# Patient Record
Sex: Female | Born: 1945
Health system: Southern US, Community
[De-identification: ages and names within clinical notes are randomized; demographics above are authoritative.]

## PROBLEM LIST (undated history)

## (undated) DIAGNOSIS — K219 Gastro-esophageal reflux disease without esophagitis: Secondary | ICD-10-CM

## (undated) DIAGNOSIS — F329 Major depressive disorder, single episode, unspecified: Secondary | ICD-10-CM

## (undated) DIAGNOSIS — R0600 Dyspnea, unspecified: Secondary | ICD-10-CM

## (undated) DIAGNOSIS — M1990 Unspecified osteoarthritis, unspecified site: Secondary | ICD-10-CM

## (undated) DIAGNOSIS — I700 Atherosclerosis of aorta: Secondary | ICD-10-CM

## (undated) DIAGNOSIS — F32A Depression, unspecified: Secondary | ICD-10-CM

## (undated) DIAGNOSIS — R1310 Dysphagia, unspecified: Secondary | ICD-10-CM

## (undated) DIAGNOSIS — F419 Anxiety disorder, unspecified: Secondary | ICD-10-CM

## (undated) DIAGNOSIS — J45909 Unspecified asthma, uncomplicated: Secondary | ICD-10-CM

## (undated) DIAGNOSIS — C801 Malignant (primary) neoplasm, unspecified: Secondary | ICD-10-CM

## (undated) DIAGNOSIS — Z8489 Family history of other specified conditions: Secondary | ICD-10-CM

## (undated) HISTORY — PX: BREAST CYST ASPIRATION: SHX578

## (undated) HISTORY — PX: BREAST LUMPECTOMY: SHX2

## (undated) HISTORY — PX: NECK SURGERY: SHX720

## (undated) HISTORY — PX: BREAST EXCISIONAL BIOPSY: SUR124

## (undated) HISTORY — DX: Gastro-esophageal reflux disease without esophagitis: K21.9

## (undated) HISTORY — DX: Malignant (primary) neoplasm, unspecified: C80.1

## (undated) HISTORY — PX: FINGER SURGERY: SHX640

## (undated) HISTORY — PX: OTHER SURGICAL HISTORY: SHX169

## (undated) HISTORY — PX: CARPAL TUNNEL RELEASE: SHX101

## (undated) HISTORY — DX: Major depressive disorder, single episode, unspecified: F32.9

## (undated) HISTORY — PX: TONSILLECTOMY: SUR1361

## (undated) HISTORY — DX: Dysphagia, unspecified: R13.10

## (undated) HISTORY — DX: Depression, unspecified: F32.A

## (undated) HISTORY — PX: BREAST SURGERY: SHX581

---

## 1998-04-05 ENCOUNTER — Other Ambulatory Visit: Admission: RE | Admit: 1998-04-05 | Discharge: 1998-04-05 | Payer: Self-pay | Admitting: Obstetrics and Gynecology

## 1999-07-16 ENCOUNTER — Other Ambulatory Visit: Admission: RE | Admit: 1999-07-16 | Discharge: 1999-07-16 | Payer: Self-pay | Admitting: Obstetrics and Gynecology

## 2001-07-13 ENCOUNTER — Encounter: Payer: Self-pay | Admitting: Obstetrics and Gynecology

## 2001-07-13 ENCOUNTER — Ambulatory Visit (HOSPITAL_COMMUNITY): Admission: RE | Admit: 2001-07-13 | Discharge: 2001-07-13 | Payer: Self-pay | Admitting: Obstetrics and Gynecology

## 2001-10-18 ENCOUNTER — Encounter (HOSPITAL_COMMUNITY): Admission: RE | Admit: 2001-10-18 | Discharge: 2001-11-17 | Payer: Self-pay | Admitting: Orthopedic Surgery

## 2001-11-17 ENCOUNTER — Encounter (HOSPITAL_COMMUNITY): Admission: RE | Admit: 2001-11-17 | Discharge: 2001-12-17 | Payer: Self-pay | Admitting: Orthopedic Surgery

## 2002-11-23 ENCOUNTER — Ambulatory Visit (HOSPITAL_COMMUNITY): Admission: RE | Admit: 2002-11-23 | Discharge: 2002-11-23 | Payer: Self-pay | Admitting: Family Medicine

## 2002-11-23 ENCOUNTER — Encounter: Payer: Self-pay | Admitting: Family Medicine

## 2003-10-11 ENCOUNTER — Ambulatory Visit (HOSPITAL_COMMUNITY): Admission: RE | Admit: 2003-10-11 | Discharge: 2003-10-11 | Payer: Self-pay | Admitting: Family Medicine

## 2003-10-13 ENCOUNTER — Ambulatory Visit (HOSPITAL_COMMUNITY): Admission: RE | Admit: 2003-10-13 | Discharge: 2003-10-13 | Payer: Self-pay | Admitting: Family Medicine

## 2003-10-17 ENCOUNTER — Ambulatory Visit (HOSPITAL_COMMUNITY): Admission: RE | Admit: 2003-10-17 | Discharge: 2003-10-17 | Payer: Self-pay | Admitting: Family Medicine

## 2003-11-27 ENCOUNTER — Ambulatory Visit (HOSPITAL_COMMUNITY): Admission: RE | Admit: 2003-11-27 | Discharge: 2003-11-27 | Payer: Self-pay | Admitting: Family Medicine

## 2003-12-12 ENCOUNTER — Ambulatory Visit (HOSPITAL_COMMUNITY): Admission: RE | Admit: 2003-12-12 | Discharge: 2003-12-12 | Payer: Self-pay | Admitting: Internal Medicine

## 2005-01-08 ENCOUNTER — Ambulatory Visit (HOSPITAL_COMMUNITY): Admission: RE | Admit: 2005-01-08 | Discharge: 2005-01-08 | Payer: Self-pay | Admitting: Family Medicine

## 2005-04-29 ENCOUNTER — Ambulatory Visit (HOSPITAL_COMMUNITY): Admission: RE | Admit: 2005-04-29 | Discharge: 2005-04-29 | Payer: Self-pay | Admitting: Family Medicine

## 2005-08-25 ENCOUNTER — Inpatient Hospital Stay (HOSPITAL_COMMUNITY): Admission: RE | Admit: 2005-08-25 | Discharge: 2005-08-26 | Payer: Self-pay | Admitting: Neurosurgery

## 2006-01-12 ENCOUNTER — Ambulatory Visit (HOSPITAL_COMMUNITY): Admission: RE | Admit: 2006-01-12 | Discharge: 2006-01-12 | Payer: Self-pay | Admitting: Internal Medicine

## 2006-07-29 ENCOUNTER — Ambulatory Visit: Payer: Self-pay | Admitting: Internal Medicine

## 2006-08-31 ENCOUNTER — Ambulatory Visit (HOSPITAL_COMMUNITY): Admission: RE | Admit: 2006-08-31 | Discharge: 2006-08-31 | Payer: Self-pay | Admitting: Internal Medicine

## 2006-08-31 ENCOUNTER — Encounter (INDEPENDENT_AMBULATORY_CARE_PROVIDER_SITE_OTHER): Payer: Self-pay | Admitting: *Deleted

## 2006-08-31 ENCOUNTER — Ambulatory Visit: Payer: Self-pay | Admitting: Internal Medicine

## 2007-01-14 ENCOUNTER — Ambulatory Visit (HOSPITAL_COMMUNITY): Admission: RE | Admit: 2007-01-14 | Discharge: 2007-01-14 | Payer: Self-pay | Admitting: Family Medicine

## 2007-11-17 ENCOUNTER — Ambulatory Visit (HOSPITAL_COMMUNITY): Admission: RE | Admit: 2007-11-17 | Discharge: 2007-11-17 | Payer: Self-pay | Admitting: Family Medicine

## 2008-02-23 ENCOUNTER — Ambulatory Visit (HOSPITAL_COMMUNITY): Admission: RE | Admit: 2008-02-23 | Discharge: 2008-02-23 | Payer: Self-pay | Admitting: Family Medicine

## 2008-11-01 ENCOUNTER — Ambulatory Visit (HOSPITAL_COMMUNITY): Admission: RE | Admit: 2008-11-01 | Discharge: 2008-11-01 | Payer: Self-pay | Admitting: Family Medicine

## 2009-05-09 ENCOUNTER — Ambulatory Visit (HOSPITAL_COMMUNITY): Admission: RE | Admit: 2009-05-09 | Discharge: 2009-05-09 | Payer: Self-pay | Admitting: Family Medicine

## 2009-07-10 ENCOUNTER — Ambulatory Visit (HOSPITAL_COMMUNITY): Admission: RE | Admit: 2009-07-10 | Discharge: 2009-07-10 | Payer: Self-pay | Admitting: Family Medicine

## 2010-02-23 ENCOUNTER — Ambulatory Visit (HOSPITAL_COMMUNITY): Admission: RE | Admit: 2010-02-23 | Discharge: 2010-02-23 | Payer: Self-pay | Admitting: Family Medicine

## 2010-06-23 ENCOUNTER — Encounter: Payer: Self-pay | Admitting: Family Medicine

## 2010-07-12 ENCOUNTER — Other Ambulatory Visit (HOSPITAL_COMMUNITY): Payer: Self-pay | Admitting: Family Medicine

## 2010-07-12 DIAGNOSIS — R51 Headache: Secondary | ICD-10-CM

## 2010-07-15 ENCOUNTER — Ambulatory Visit (HOSPITAL_COMMUNITY)
Admission: RE | Admit: 2010-07-15 | Discharge: 2010-07-15 | Disposition: A | Discharge status: Home / Self Care | Payer: Medicare Other | Source: Ambulatory Visit | Attending: Family Medicine | Admitting: Family Medicine

## 2010-07-15 DIAGNOSIS — R51 Headache: Secondary | ICD-10-CM | POA: Insufficient documentation

## 2010-10-09 ENCOUNTER — Other Ambulatory Visit (HOSPITAL_COMMUNITY): Payer: Self-pay | Admitting: Family Medicine

## 2010-10-09 DIAGNOSIS — Z139 Encounter for screening, unspecified: Secondary | ICD-10-CM

## 2010-10-15 ENCOUNTER — Ambulatory Visit (HOSPITAL_COMMUNITY)
Admission: RE | Admit: 2010-10-15 | Discharge: 2010-10-15 | Disposition: A | Discharge status: Home / Self Care | Payer: Medicare Other | Source: Ambulatory Visit | Attending: Family Medicine | Admitting: Family Medicine

## 2010-10-15 DIAGNOSIS — Z139 Encounter for screening, unspecified: Secondary | ICD-10-CM

## 2010-10-15 DIAGNOSIS — Z1231 Encounter for screening mammogram for malignant neoplasm of breast: Secondary | ICD-10-CM | POA: Insufficient documentation

## 2010-10-18 NOTE — Op Note (Signed)
NAME:  Dominique Cobb, Dominique Cobb NO.:  [id number]   MEDICAL RECORD NO.:  [id number]                  PATIENT TYPE:   LOCATION:                                       FACILITY:   PHYSICIAN:  Lionel December, M.D.                 DATE OF BIRTH:   DATE OF PROCEDURE:  12/12/2003  DATE OF DISCHARGE:                                 OPERATIVE REPORT   (CORRECTED COPY   PROCEDURE:  Total colonoscopy.   ENDOSCOPIST:  Lionel December, M.D.   INDICATIONS:  This patient is 65 year old Caucasian female who is undergoing  screening colonoscopy.  Family history is negative for CRC.   The procedure and risks were reviewed with the patient and informed consent  was obtained.   PREOPERATIVE MEDICATIONS:  Demerol 35 mg IV and Versed 7 mg IV.   FINDINGS:  Procedure performed in endoscopy suite.  The patient's vital  signs and O2 saturation were monitored during the procedure and remained  stable.  The patient was placed in the left lateral recumbent position and  rectal examination was performed.  No abnormality noted on external or  digital exam.   Olympus videoscope was placed in the rectum and advanced under vision into  the sigmoid colon and beyond.  The scope was passed into the cecum which was  identified by ileocecal valve and appendiceal orifice.  Pictures taken for  the record.  As the scope was withdrawn, the colonic mucosa was, once again,  carefully examined.  There was a tiny polyp at descending colon which was  ablated by a cold biopsy.  A few tiny diverticula were noted at the sigmoid  colon on the way in. There were no other abnormalities at the sigmoid or the  rectum.   The scope was retroflexed to examine the anorectal junction which was  unremarkable.  The endoscope was straightened and withdrawn.  The patient  tolerated the procedure well.   FINAL DIAGNOSES:  1. A few small diverticula at sigmoid colon.  2. A tiny polyp ablated via cold biopsy from  descending colon.   RECOMMENDATIONS:  1. High fiber diet.  2. I will be contacting the patient with biopsy results.  If this is an     adenoma, she will return for repeat exam in 5 years; otherwise could wait     10 years before her next screening study.      ___________________________________________                                            Lionel December, M.D.   NR/MEDQ  D:  12/12/2003  T:  12/12/2003  Job:  [id number]   cc:   Corrie Mckusick, M.D.  [address] [place]. [place]  [place]  [place] [address]  Fax: [phone]

## 2010-10-18 NOTE — Op Note (Signed)
NAMEBRETTANY, Dominique Cobb               ACCOUNT NO.:  [id number]   MEDICAL RECORD NO.:  [id number]          PATIENT TYPE:  INP   LOCATION:  3023                         FACILITY:  [facility]   PHYSICIAN:  Hewitt Shorts, M.D.DATE OF BIRTH:  Oct 24, 1945   DATE OF PROCEDURE:  08/25/2005  DATE OF DISCHARGE:  08/26/2005                                 OPERATIVE REPORT   PREOPERATIVE DIAGNOSIS:  Cervical spondylosis, cervical degenerative disk  disease, and cervical stenosis.   POSTOPERATIVE DIAGNOSIS:  Cervical spondylosis, cervical degenerative disk  disease, and cervical stenosis.   OPERATION PERFORMED:  C5-6 and C6-7 anterior cervical diskectomy and  arthrodesis with PEEK interbody implants and DBX and tether cervical  plating.   SURGEON:  Hewitt Shorts, M.D.   ASSISTANT:  Danae Orleans. Venetia Maxon, M.D.   ANESTHESIA:  General endotracheal.   INDICATIONS FOR PROCEDURE:  The patient is a 65 year old woman who presented  with neck pain, pain extending down toward the right shoulder.  She was  found to have advanced spondylosis and degenerative disk disease at the C5-6  and C6-7 levels with resulting canal stenosis and decision was made to  proceed with decompression and arthrodesis.   DESCRIPTION OF PROCEDURE:  The patient was brought to the operating room and  placed under general endotracheal anesthesia.  The patient was placed in 10  pounds of halter traction.  The neck was prepped with Betadine soap and  solution and draped in sterile fashion.  A horizontal incision was made on  the left side of the neck.  The line of the incision was infiltrated with  local anesthetic and epinephrine.  Dissection was carried down through the  subcutaneous tissue and platysma and then dissection was carried out to an  avascular plane, leaving the sternocleidomastoid, carotid artery and jugular  vein laterally and trachea and esophagus medially.  The ventral aspect of  the vertebral column identified  and a localizing x-ray was taken.  The C5-6  and C6-7 intervertebral disk spaces were identified.  Diskectomy was begun  at each level with incision of the annulus continued with micro curettes and  pituitary rongeurs. Anterior osteophytic overgrowth was removed using a  double action rongeur at each level and then the cartilaginous end plates of  the corresponding vertebrae were removed using micro curettes and the X-Max  drill.  The operating microscope was draped and brought into the field to  provide additional magnification, illumination and visualization and the  remainder of the decompression was performed using microdissection and  microsurgical technique. Diskectomy was performed with micro curettes and  pituitary rongeurs.  Posterior osteophytic overgrowth was significant at  each level and at each level we thinned the osteophytic overgrowth with the  X-Max drill along and then removed the last bit with 2 mm Kerrison punch  with a thin foot plate.  The posterior longitudinal ligament was thickened  at each level and partially calcified and this was likewise carefully  removed and we were able to decompress the spinal canal and thecal sac and  subsequently the neural foramina and nerve roots.  Once the  decompression  was completed, hemostasis was established with the use of Gelfoam soaked in  Thrombin.  We measured the intervertebral disk spaces and selected 5 mm  implants.  We used AVSAS PEEK interbody implants, small size at the C5-6 and  a large size at the C6-7.  Each was packed with DBX putty and carefully  positioned.  We then selected a 32 mm tether cervical plate.  The traction  was discontinued.  The plate was positioned over the fusion construct and  secured to the vertebrae with 4.0 x 14 mm screws, each was variable angle.  We placed two screws at C5, two screws at C7 and a single screw at C6.  Each  screw hole was drilled and tapped and the screw placed in alternating   fashion.  Final tightening was done of all five screws and then the wound  was irrigated with bacitracin solution, checked for hemostasis which was  established and confirmed and then closure was performed with the platysma  being approximated with interrupted inverted 2-0 undyed Vicryl sutures and  the subcutaneous and subcuticular layer were closed with interrupted  inverted 3-0 undyed Vicryl sutures and the skin edges were approximated with  DermaBond.  The procedure was tolerated well.  The estimated blood loss was  50 mL.  Sponge, needle and instrument counts were correct.  Following  surgery, the patient was to be placed in a soft cervical collar, reversed  from anesthetic, extubated and transferred to recovery room for further  care.      Hewitt Shorts, M.D.  Electronically Signed     RWN/MEDQ  D:  08/25/2005  T:  08/26/2005  Job:  [id number]

## 2010-10-18 NOTE — Op Note (Signed)
[name], [name]               ACCOUNT NO.:  [id number]   MEDICAL RECORD NO.:  [id number]          PATIENT TYPE:  AMB   LOCATION:  DAY                           FACILITY:  [facility]   PHYSICIAN:  Lionel December, M.D.    DATE OF BIRTH:  [birth year]   DATE OF PROCEDURE:  08/31/2006  DATE OF DISCHARGE:                               OPERATIVE REPORT   PROCEDURE:  Esophagogastroduodenoscopy with esophageal dilation.   INDICATION:  [name] is 65 year old Caucasian female with several-month  history of dysphagia both solids as well as liquids.  She does not even  know what heartburn is.  But no history of heartburn.  She is undergoing  diagnostic/therapeutic procedure.  Procedure risks were reviewed with  the patient and informed consent was obtained.   MEDS FOR CONSCIOUS SEDATION:  Benzocaine spray for pharyngeal topical  anesthesia, Demerol 50 mg IV, Versed 5 mg IV.   FINDINGS:  Procedure performed in endoscopy suite.  The patient's vital  signs and O2 sat were monitored during procedure and remained stable.  The patient was placed left lateral recumbent position and Pentax  videoscope was passed via oropharynx without any difficulty into  esophagus.   Esophagus.  Mucosa of the esophagus was normal except very faint mucosal  ridging at body.  GE junction was at 40 cm from the incisors and was  unremarkable without ring or stricture formation.  The proximal segment  was carefully examined on the way out was normal.   Stomach.  It was empty and distended very well insufflation.  Folds of  proximal stomach were normal.  Examination mucosa revealed 4 to 5 mm  polyp at gastric body which was ablated via cold biopsy.  There was  prepyloric/antral scar with central erosion indicative of a healed  ulcer.  Pictures taken for the record.  Pyloric channel was patent.  Angularis, fundus and cardia examined by retroflexing scope and were  normal.   Duodenum.  Bulbar mucosa was normal.  Scope was  passed into second part  of duodenum where mucosa and folds were normal.   Endoscope was withdrawn.   Esophagus was dilated by passing 54-French Maloney dilator to full  insertion.  As the dilator was withdrawn, endoscope was passed again,  there were few tiny linear areas with mucosal disruption at cervical  esophagus indicative of either an unrecognized web or narrowing in the  segments.  Pictures taken for the record.  A biopsy was also obtained  from mucosa at esophageal body, looking for eosinophilic esophagitis.  Endoscope was withdrawn.  The patient tolerated the procedure well.   FINAL DIAGNOSIS:  No obvious esophageal stricture or ring noted but  esophageal dilation with 54-French Maloney dilator resulting in a few  linear tears of cervical esophagus either indicative of either of web or  localize esophageal narrowing.   Esophageal biopsy taken from the body, looking for eosinophilic  esophagitis.   The small gastric polyp at body which was ablated via cold biopsy.   Healed antral ulcer.   RECOMMENDATIONS:  She will resume her usual diet.  Check her H pylori  serology.  I will be contacting the patient with results of biopsy and  blood test.  If she remains with dysphagia, will proceed with barium  pill study prior to considering esophageal manometry.      Lionel December, M.D.  Electronically Signed     NR/MEDQ  D:  08/31/2006  T:  08/31/2006  Job:  [id number]   cc:   Dominique Cobb, M.D.  Fax: [phone]

## 2010-10-18 NOTE — Consult Note (Signed)
NAMERISHA, BARRETTA               ACCOUNT NO.:  [id number]   MEDICAL RECORD NO.:  [id number]          PATIENT TYPE:  AMB   LOCATION:  DAY                           FACILITY:  [facility]   PHYSICIAN:  Lionel December, M.D.    DATE OF BIRTH:  November 12, 1945   DATE OF CONSULTATION:  07/29/2006  DATE OF DISCHARGE:                                 CONSULTATION   PRESENTING COMPLAINT:  Dysphagia to liquids.   HISTORY OF PRESENT ILLNESS:  Dominique Cobb is a 65 year old Caucasian female who  is referred through courtesy of Dr. Phillips Odor for evaluation of dysphagia.  This symptom began about a year ago.  It has been sporadic but  experienced more and more frequent lately.  Now she may have it a couple  of times a week.  She reports difficulty with liquids, primarily with  soft drinks, coffee and wine which she may drink once or twice a week.  She describes her symptom as little bump due to flow of liquid.  She  points to her mid to upper sternal area as the site of bolus  obstruction.  Liquid always goes down.  She has not had any episode of  regurgitation or coughing spells.  She does not have any pain associated  with it.  She has noticed some difficulty with large pills which she is  having to break but reports no difficulty with solids.  She has noted  intermittent hoarseness and need to clear her throat.  She remains with  very good appetite.  She denies melena, rectal bleeding or abdominal  pain.  She also does not give history of heartburn.  She is very  concerned about this symptom because of a several year history of  smoking, which she quit 3 years ago.   She is on Wellbutrin 200 mg b.i.d., MVI daily, calcium with vitamin D  daily, OTC ibuprofen p.r.n. no more than a couple of doses a month and  Sudafed daily p.r.n.   PAST MEDICAL HISTORY:  1. Chronic depression well-controlled with therapy.  2. She had normal bone density study 2 years ago.  3. She had tonsillectomy at age 60.  4. She has had 2  benign cysts removed from her left breast one 30      years ago, another one 15 years ago.  5. She has had bilateral decompression for carpal tunnel syndrome.  6. She had neck surgery with fusion at C5-C6 and C6-C7 in March 2007.  7. She had surgery on her right shoulder for biceps injury as well as      rotator cuff.  8. She had release of 2 trigger fingers in her right hand and one      finger in the left hand.  9. She had screening colonoscopy by me in July 2005 revealing few      diverticula at sigmoid colon and she had one hyperplastic polyp      removed.   ALLERGIES:  To CODEINE which causes somnolence and irritability.   FAMILY HISTORY:  Mother died of auto accident at age 72.  Father lived  to be 92 and she does not have any siblings.   SOCIAL HISTORY:  She is single.  She does not have any children.  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  She smokes a pack of cigarettes a day  for more than 30 years but quit 3 years ago.  She drinks wine maybe once  or twice a week.   PHYSICAL EXAM:  Pleasant well-developed, well-nourished Caucasian female  who is in no acute distress.  She weighs 131 pounds.  She is 5 feet 4  inches tall.  Pulse 60 per minute, blood pressure 112/80, temperature is  97.6.  Conjunctivae is pink.  Sclerae is nonicteric.  Oropharyngeal  mucosa is normal.  Dentition is satisfactory condition.  No neck masses  or thyromegaly noted.  Cardiac exam with regular rhythm.  Normal S1-S2.  No murmur or gallop noted.  Lungs are clear to auscultation.  Abdomen is  symmetrical, soft and nontender without organomegaly or masses.  No  peripheral edema or clubbing noted.   ASSESSMENT:  Dominique Cobb is a 65 year old Caucasian female who presents with  intermittent dysphagia which is experienced with liquids and at times  and pills but not solids.  She does not have any throat symptoms other  than that but not  with solids.  While she does give history of  intermittent hoarseness and need to clear her throat, there is nothing  to suggest aspiration.  She is pointing to her midsternum as the site of  a little bump.  Her dysphagia, little bump or transient obstruction  to bolus of liquid presentation is rather curious.  I wonder if she has  a motility disorder or a Zenker's diverticulum.  However, structural  abnormality also needs to be ruled out before less common diagnoses is  considered.   RECOMMENDATIONS:  Diagnostic esophagogastroduodenoscopy to be performed  at [facility] with esophageal dilation if indicated.   I have reviewed the procedure and risks with the patient; she is  agreeable.   We would like to thank Dr. Phillips Odor for the opportunity to participate in  the care of this nice lady. signs not she p.m.      Lionel December, M.D.  Electronically Signed     NR/MEDQ  D:  07/29/2006  T:  07/30/2006  Job:  [id number]   cc:   Corrie Mckusick, M.D.  Fax: [phone]

## 2011-02-21 ENCOUNTER — Encounter (INDEPENDENT_AMBULATORY_CARE_PROVIDER_SITE_OTHER): Payer: Self-pay | Admitting: *Deleted

## 2011-03-06 ENCOUNTER — Ambulatory Visit (INDEPENDENT_AMBULATORY_CARE_PROVIDER_SITE_OTHER): Payer: Medicare Other | Admitting: Internal Medicine

## 2011-03-20 ENCOUNTER — Ambulatory Visit (INDEPENDENT_AMBULATORY_CARE_PROVIDER_SITE_OTHER): Payer: Medicare Other | Admitting: Internal Medicine

## 2011-03-20 ENCOUNTER — Other Ambulatory Visit (INDEPENDENT_AMBULATORY_CARE_PROVIDER_SITE_OTHER): Payer: Self-pay | Admitting: *Deleted

## 2011-03-20 ENCOUNTER — Encounter (INDEPENDENT_AMBULATORY_CARE_PROVIDER_SITE_OTHER): Payer: Self-pay | Admitting: *Deleted

## 2011-03-20 ENCOUNTER — Encounter (INDEPENDENT_AMBULATORY_CARE_PROVIDER_SITE_OTHER): Payer: Self-pay | Admitting: Internal Medicine

## 2011-03-20 DIAGNOSIS — F329 Major depressive disorder, single episode, unspecified: Secondary | ICD-10-CM

## 2011-03-20 DIAGNOSIS — R1310 Dysphagia, unspecified: Secondary | ICD-10-CM | POA: Insufficient documentation

## 2011-03-20 DIAGNOSIS — F32A Depression, unspecified: Secondary | ICD-10-CM | POA: Insufficient documentation

## 2011-03-20 DIAGNOSIS — K219 Gastro-esophageal reflux disease without esophagitis: Secondary | ICD-10-CM | POA: Insufficient documentation

## 2011-03-20 NOTE — Patient Instructions (Signed)
Dexilant samples given to patient. Take Dexilant 30 minutes before breakfast.  Chew foods well.

## 2011-03-20 NOTE — Progress Notes (Signed)
Subjective:     Patient ID: Dominique Cobb, female   DOB: February 09, 1946, 65 y.o.   MRN: [id number]  HPI Dominique Cobb is a 66 yr old female referred to our office for GERD.   She says when she drinks water it feels like a marble going down.  The Prilosec has helped but she is not symptom free.   It hurts to swallow liquids.  Some foods are slow to go down.  After her neck surgery she had troubles swallowing pills.  She does not drink soft drinks because they burn going down.  Hoarseness and she clears her throat.  She has had symptoms for 3-4 months.  She has had an EGD/ED years ago for dysphagia.  Review of Systems see hpi Current Outpatient Prescriptions  Medication Sig Dispense Refill  . buPROPion (WELLBUTRIN) 100 MG tablet Take 300 mg by mouth daily.        Marland Kitchen ibuprofen (ADVIL,MOTRIN) 200 MG tablet Take 200 mg by mouth every 6 (six) hours as needed.        . Misc Natural Products (TART CHERRY ADVANCED PO) Take by mouth 3 (three) times daily.        Marland Kitchen omeprazole (PRILOSEC) 20 MG capsule Take 20 mg by mouth 2 (two) times daily before a meal.        . pseudoephedrine (SUDAFED) 30 MG tablet Take 30 mg by mouth every 4 (four) hours as needed.         No current outpatient prescriptions on file prior to visit.    Past Medical History  Diagnosis Date  . Depression    History   Social History Narrative  . No narrative on file   Family Status  Relation Status Death Age  . Mother Deceased     fire accident  . Father Deceased     natural causes   History   Social History Narrative  . No narrative on file   History   Social History  . Marital Status: Divorced    Spouse Name: N/A    Number of Children: N/A  . Years of Education: N/A   Occupational History  . Not on file.   Social History Main Topics  . Smoking status: Never Smoker   . Smokeless tobacco: Not on file  . Alcohol Use: No  . Drug Use: No  . Sexually Active: Not on file   Other Topics Concern  . Not on file    Social History Narrative  . No narrative on file  .No Known Allergies    Objective:   Physical Exam Filed Vitals:   03/20/11 1118  BP: 110/70  Pulse: 62  Temp: 98.2 F (36.8 C)  Height: 5\' 4"  (1.626 m)  Weight: 132 lb 8 oz (60.102 kg)    Alert and oriented. Skin warm and dry. Oral mucosa is moist. Natural teeth in good condition. Sclera anicteric, conjunctivae is pink. Thyroid not enlarged. No cervical lymphadenopathy. Lungs clear. Heart regular rate and rhythm.  Abdomen is soft. Bowel sounds are positive. No hepatomegaly. No abdominal masses felt. No tenderness.  No edema to lower extremities. Patient is alert and oriented.      Assessment:    Solid food dysphagia. Esophageal burning.  Esophagitis needs to be ruled out.     Plan:     EGD/ED  Samples of Dexilant (6 boxes given to patient). Stop Prilosec. If you have breakthru acid reflux at night, may take a Prilosec

## 2011-04-07 ENCOUNTER — Encounter (HOSPITAL_COMMUNITY): Payer: Self-pay | Admitting: Pharmacy Technician

## 2011-04-09 MED ORDER — SODIUM CHLORIDE 0.45 % IV SOLN
Freq: Once | INTRAVENOUS | Status: AC
  Administered 2011-04-10: 1000 mL via INTRAVENOUS

## 2011-04-10 ENCOUNTER — Other Ambulatory Visit (INDEPENDENT_AMBULATORY_CARE_PROVIDER_SITE_OTHER): Payer: Self-pay | Admitting: Internal Medicine

## 2011-04-10 ENCOUNTER — Ambulatory Visit (HOSPITAL_COMMUNITY)
Admission: RE | Admit: 2011-04-10 | Discharge: 2011-04-10 | Disposition: A | Discharge status: Home / Self Care | Payer: Medicare Other | Source: Ambulatory Visit | Attending: Internal Medicine | Admitting: Internal Medicine

## 2011-04-10 ENCOUNTER — Encounter (HOSPITAL_COMMUNITY)
Admission: RE | Disposition: A | Discharge status: Home / Self Care | Payer: Self-pay | Source: Ambulatory Visit | Attending: Internal Medicine

## 2011-04-10 ENCOUNTER — Encounter (HOSPITAL_COMMUNITY): Payer: Self-pay | Admitting: *Deleted

## 2011-04-10 DIAGNOSIS — Q398 Other congenital malformations of esophagus: Secondary | ICD-10-CM | POA: Insufficient documentation

## 2011-04-10 DIAGNOSIS — R1310 Dysphagia, unspecified: Secondary | ICD-10-CM | POA: Insufficient documentation

## 2011-04-10 DIAGNOSIS — K208 Other esophagitis: Secondary | ICD-10-CM

## 2011-04-10 DIAGNOSIS — K222 Esophageal obstruction: Secondary | ICD-10-CM

## 2011-04-10 HISTORY — DX: Unspecified osteoarthritis, unspecified site: M19.90

## 2011-04-10 SURGERY — ESOPHAGOGASTRODUODENOSCOPY (EGD) WITH ESOPHAGEAL DILATION
Anesthesia: Moderate Sedation

## 2011-04-10 MED ORDER — MEPERIDINE HCL 25 MG/ML IJ SOLN
INTRAMUSCULAR | Status: DC | PRN
  Administered 2011-04-10 (×2): 25 mg via INTRAVENOUS

## 2011-04-10 MED ORDER — BUTAMBEN-TETRACAINE-BENZOCAINE 2-2-14 % EX AERO
INHALATION_SPRAY | CUTANEOUS | Status: DC | PRN
  Administered 2011-04-10: 2 via TOPICAL

## 2011-04-10 MED ORDER — MIDAZOLAM HCL 5 MG/5ML IJ SOLN
INTRAMUSCULAR | Status: DC | PRN
  Administered 2011-04-10: 1 mg via INTRAVENOUS
  Administered 2011-04-10 (×2): 2 mg via INTRAVENOUS
  Administered 2011-04-10: 1 mg via INTRAVENOUS

## 2011-04-10 MED ORDER — MIDAZOLAM HCL 5 MG/5ML IJ SOLN
INTRAMUSCULAR | Status: AC
  Filled 2011-04-10: qty 10

## 2011-04-10 MED ORDER — MEPERIDINE HCL 50 MG/ML IJ SOLN
INTRAMUSCULAR | Status: AC
  Filled 2011-04-10: qty 1

## 2011-04-10 MED ORDER — STERILE WATER FOR IRRIGATION IR SOLN
Status: DC | PRN
  Administered 2011-04-10: 09:00:00

## 2011-04-10 MED ORDER — DEXLANSOPRAZOLE 60 MG PO CPDR: 60.0000 mg | DELAYED_RELEASE_CAPSULE | Freq: Every day | ORAL | Status: DC

## 2011-04-10 NOTE — Op Note (Signed)
EGD PROCEDURE REPORT  PATIENT:  Dominique Cobb  MR#:  [id number] Birthdate:  1945/12/31, 66 y.o., female Endoscopist:  Dr. Malissa Hippo, MD Referred By:  Dr. Colette Ribas, M.D. Procedure Date: 04/10/2011  Procedure:   EGD with ED.  Indications:  Patient is 65 year old Caucasian female with esophageal to solids as well as pills. Her last EGD was in March 2008 but no structural abnormality to her esophagus and she responded to [facility] dilation. At that time she was felt to have oximeter esophageal web or stricture. Her GERD symptoms are well controlled with therapy. He believes she is getting better response with dexilant rather than omeprazole.            Informed Consent:  Procedure and risks were reviewed with the patient and informed consent was obtained Medications:  Demerol 50 mg IV Versed 6 mg IV Cetacaine spray topically for oropharyngeal anesthesia  Description of procedure:  The endoscope was introduced through the mouth and advanced to the second portion of the duodenum without difficulty or limitations. The mucosal surfaces were surveyed very carefully during advancement of the scope and upon withdrawal.  Findings:  Esophagus:  There were conspicuous mucosal ridges or incomplete ring at esophageal body; none was felt to be critical or high-grade. No erosions or ulcers were identified. GEJ:  40 cm Stomach:  Stomach was empty and distended very well with insufflation. Folds in the proximal stomach were normal. Duodenum:  Normal bulbar and post bulbar mucosa.  Therapeutic/Diagnostic Maneuvers Performed:  Esophageal dilation tented with 54 French Maloney dilator could not be passed beyond 25 cm. Soft and this was then dilated by passing 48 [place] Maloney dilator to full insertion. The scope was passed again and there was mucosal disruption from 22-25 cm from the incisors. No mucosal disruption noted to distal esophageal mucosa. Esophageal biopsy was taken from mucosa at body  looking for eosinophilic esophagitis.  Complications:  None  Impression: No evidence of erosive or ulcerative esophagitis. Esophageal narrowing voluming proximal segment undated with 48 French Maloney dilator.  Esophageal biopsy taken to rule out eosinophilic esophagitis.   Recommendations:  Soft diet for 24-hours. Continue anti-reflux measures and Dexilant as before. I will be contacting patient with results of biopsy.  Ranald Alessio U  04/10/2011  9:51 AM  CC: Dr. Colette Ribas, MD & Dr. Bonnetta Barry ref. provider found

## 2011-04-10 NOTE — H&P (Signed)
This is an update to history and physical from 03/20/2011. Patient's history or medications have not changed. She has dysphagia primarily to solids and points to the midsternal area site of bolus obstruction. He also is having difficulty swallowing her pills. Patient,s  last EGD ED was in March 2008. No structural abnormality was identified. She did respond to [facility] dilation. Patient is up-to-date on screening for CRC. Her last exam was in July 2007. Patient is agreeable to proceed with EGD and ED.

## 2011-04-14 ENCOUNTER — Telehealth (INDEPENDENT_AMBULATORY_CARE_PROVIDER_SITE_OTHER): Payer: Self-pay | Admitting: Internal Medicine

## 2011-04-14 NOTE — Telephone Encounter (Signed)
Will call an Rx in For Dexilant.

## 2011-04-14 NOTE — Telephone Encounter (Signed)
Rx called in to [facility]

## 2011-04-14 NOTE — Telephone Encounter (Signed)
Needs a refill on Dexilant 90 day supply. It will require an auth and the phone number is [phone]. Dominique Cobb would also like to get her biopsy results. The return phone number is [phone].

## 2011-04-16 ENCOUNTER — Telehealth (INDEPENDENT_AMBULATORY_CARE_PROVIDER_SITE_OTHER): Payer: Self-pay | Admitting: Internal Medicine

## 2011-04-16 NOTE — Telephone Encounter (Signed)
I printed the results from the BX and will give it ti Terri Setzer,NP to address with patient .

## 2011-04-16 NOTE — Telephone Encounter (Signed)
Would like to get the biopsy results. Please return the call to [phone].

## 2011-04-16 NOTE — Telephone Encounter (Signed)
I advised her that Dr. Karilyn Cota would call her with the biopsy results.

## 2011-04-16 NOTE — Telephone Encounter (Signed)
Dr. Karilyn Cota will call her with  results

## 2011-04-17 NOTE — Telephone Encounter (Signed)
I went over the results of biopsy with the patient last evening. She experienced some discomfort on swallowing but this has completely resolved. She is able to swallow much better. Biopsy negative for eosinophilic esophagitis. Patient will continue Dexilant and return for office visit in 6 months.  Please send a copy of report to PCP.

## 2011-04-17 NOTE — Telephone Encounter (Signed)
6 mth f/u has been added to the patient's recall list for apt

## 2011-10-07 ENCOUNTER — Encounter (INDEPENDENT_AMBULATORY_CARE_PROVIDER_SITE_OTHER): Payer: Self-pay | Admitting: *Deleted

## 2011-10-23 ENCOUNTER — Ambulatory Visit (INDEPENDENT_AMBULATORY_CARE_PROVIDER_SITE_OTHER): Payer: Medicare Other | Admitting: Internal Medicine

## 2011-11-05 ENCOUNTER — Encounter (INDEPENDENT_AMBULATORY_CARE_PROVIDER_SITE_OTHER): Payer: Self-pay | Admitting: Internal Medicine

## 2011-11-05 ENCOUNTER — Ambulatory Visit (INDEPENDENT_AMBULATORY_CARE_PROVIDER_SITE_OTHER): Payer: Medicare Other | Admitting: Internal Medicine

## 2011-11-05 VITALS — BP 110/64 | HR 72 | Temp 98.4°F | Ht 64.0 in | Wt 131.7 lb

## 2011-11-05 DIAGNOSIS — R1310 Dysphagia, unspecified: Secondary | ICD-10-CM

## 2011-11-05 MED ORDER — OMEPRAZOLE 20 MG PO CPDR: 20.0000 mg | DELAYED_RELEASE_CAPSULE | Freq: Two times a day (BID) | ORAL | Status: DC

## 2011-11-05 NOTE — Patient Instructions (Signed)
Chew foods well. Cut meats up well. Barium Swallow

## 2011-11-05 NOTE — Progress Notes (Signed)
Subjective:     Patient ID: Dominique Cobb, female   DOB: 11-Sep-1945, 66 y.o.   MRN: [id number]  HPI Presents today for f/u after undergoing an EGD/ED in November. See below.  She continues to have difficulty swallowing. It is better.  She still has to be careful if she drinks fluids. She is eating smaller bites. She thinks some of this is related to her neck. Appetite is good. No weight loss.   She says for right now she does not want EGD/ED repeated.  04/10/2011 EGD/ED:Impression:  No evidence of erosive or ulcerative esophagitis.  Esophageal narrowing voluming proximal segment undated with 48 French Maloney dilator.  Esophageal biopsy taken to rule out eosinophilic esophagitis.       Review of Systems see hpi Current Outpatient Prescriptions  Medication Sig Dispense Refill  . buPROPion (WELLBUTRIN XL) 300 MG 24 hr tablet Take 300 mg by mouth daily.        . cycloSPORINE (RESTASIS) 0.05 % ophthalmic emulsion Place 1 drop into both eyes 2 (two) times daily.        Marland Kitchen dexlansoprazole (DEXILANT) 60 MG capsule Take 60 mg by mouth daily.        Marland Kitchen dexlansoprazole (DEXILANT) 60 MG capsule Take 1 capsule (60 mg total) by mouth daily.  30 capsule  5  . omeprazole (PRILOSEC) 20 MG capsule Take 1 capsule (20 mg total) by mouth 2 (two) times daily before a meal.  60 capsule  3   Past Medical History  Diagnosis Date  . Depression   . Arthritis   . GERD (gastroesophageal reflux disease)   . Dysphagia    Past Surgical History  Procedure Date  . Neck surgery     2007  . Breast surgery     benign.  25 yrs ago  . Laproscopy     over 20 yrs ago  . Dental implants   . Carpal tunnel release 1980's    bilateral   . Finger surgery     trigger finger 3rd  bilaterally   Family Status  Relation Status Death Age  . Mother Deceased     fire accident  . Father Deceased     natural causes   History   Social History  . Marital Status: Divorced    Spouse Name: N/A    Number of Children:  N/A  . Years of Education: N/A   Occupational History  . Not on file.   Social History Main Topics  . Smoking status: Former Smoker -- 20 years    Types: Cigarettes  . Smokeless tobacco: Not on file   Comment: quit 7 yrs ago  . Alcohol Use: Yes     occasionally+  . Drug Use: No  . Sexually Active: Not on file   Other Topics Concern  . Not on file   Social History Narrative  . No narrative on file   Allergies  Allergen Reactions  . Codeine Other (See Comments)    Sweating and Patient just doesn't like.         Objective:   Physical Exam Filed Vitals:   11/05/11 1149  Height: 5\' 4"  (1.626 m)  Weight: 131 lb 11.2 oz (59.739 kg)   Alert and oriented. Skin warm and dry. Oral mucosa is moist.   . Sclera anicteric, conjunctivae is pink. Thyroid not enlarged. No cervical lymphadenopathy. Lungs clear. Heart regular rate and rhythm.  Abdomen is soft. Bowel sounds are positive. No hepatomegaly. No abdominal masses felt.  No tenderness.  No edema to lower extremities. Patient is alert and oriented.     Assessment:    Dysphagia to solids and liquids. Somewhat improved. She has undergone 2 EGD/EDs in the past.     Plan:    Small bites. Chew food well. OV prn. Omeprazole 20mg  BID eprescribed.

## 2011-12-23 ENCOUNTER — Other Ambulatory Visit (HOSPITAL_COMMUNITY): Payer: Self-pay | Admitting: Family Medicine

## 2011-12-23 DIAGNOSIS — Z139 Encounter for screening, unspecified: Secondary | ICD-10-CM

## 2011-12-24 ENCOUNTER — Ambulatory Visit (HOSPITAL_COMMUNITY)
Admission: RE | Admit: 2011-12-24 | Discharge: 2011-12-24 | Disposition: A | Discharge status: Home / Self Care | Payer: Medicare Other | Source: Ambulatory Visit | Attending: Family Medicine | Admitting: Family Medicine

## 2011-12-24 DIAGNOSIS — Z1231 Encounter for screening mammogram for malignant neoplasm of breast: Secondary | ICD-10-CM | POA: Insufficient documentation

## 2011-12-24 DIAGNOSIS — Z139 Encounter for screening, unspecified: Secondary | ICD-10-CM

## 2012-04-14 ENCOUNTER — Other Ambulatory Visit (INDEPENDENT_AMBULATORY_CARE_PROVIDER_SITE_OTHER): Payer: Self-pay | Admitting: Internal Medicine

## 2012-05-17 ENCOUNTER — Other Ambulatory Visit (HOSPITAL_COMMUNITY): Payer: Self-pay | Admitting: Family Medicine

## 2012-05-17 DIAGNOSIS — Z0000 Encounter for general adult medical examination without abnormal findings: Secondary | ICD-10-CM

## 2012-05-19 ENCOUNTER — Ambulatory Visit (HOSPITAL_COMMUNITY)
Admission: RE | Admit: 2012-05-19 | Discharge: 2012-05-19 | Disposition: A | Discharge status: Home / Self Care | Payer: Medicare Other | Source: Ambulatory Visit | Attending: Family Medicine | Admitting: Family Medicine

## 2012-05-19 DIAGNOSIS — M810 Age-related osteoporosis without current pathological fracture: Secondary | ICD-10-CM | POA: Insufficient documentation

## 2012-05-19 DIAGNOSIS — Z0000 Encounter for general adult medical examination without abnormal findings: Secondary | ICD-10-CM

## 2012-10-14 ENCOUNTER — Other Ambulatory Visit (INDEPENDENT_AMBULATORY_CARE_PROVIDER_SITE_OTHER): Payer: Self-pay | Admitting: *Deleted

## 2012-10-14 ENCOUNTER — Encounter (INDEPENDENT_AMBULATORY_CARE_PROVIDER_SITE_OTHER): Payer: Self-pay | Admitting: Internal Medicine

## 2012-10-14 ENCOUNTER — Telehealth (INDEPENDENT_AMBULATORY_CARE_PROVIDER_SITE_OTHER): Payer: Self-pay | Admitting: *Deleted

## 2012-10-14 ENCOUNTER — Ambulatory Visit (INDEPENDENT_AMBULATORY_CARE_PROVIDER_SITE_OTHER): Payer: 59 | Admitting: Internal Medicine

## 2012-10-14 VITALS — BP 138/82 | HR 76 | Ht 64.0 in | Wt 126.8 lb

## 2012-10-14 DIAGNOSIS — R195 Other fecal abnormalities: Secondary | ICD-10-CM

## 2012-10-14 DIAGNOSIS — Z1211 Encounter for screening for malignant neoplasm of colon: Secondary | ICD-10-CM

## 2012-10-14 NOTE — Progress Notes (Signed)
Subjective:     Patient ID: Dominique Cobb, female   DOB: [birth year], 67 y.o.   MRN: [id number]  HPI Here today with c/o for constipation. She tells me she is not eating as well as she use to. She is having a BM (small) sometimes every day and sometimes she will skip a day.  She tells me her stools are thinner.  She has lost 4 pounds in a year. She occasionally see blood when she wipes.  She has had braces placed in November. She tells me it is hard to eat because of her braces. She is swallowing her food in large pieces because it hurts to chews. She tells me she usually has a BM once a day.   She still has some tightness in her esophagus when she swallows.  12/12/2003 Colonoscopy, Dr. Karilyn Cota: FINAL DIAGNOSES:  1. A few small diverticula at sigmoid colon.  2. A tiny polyp ablated via cold biopsy from descending colon.  Biopsy not in epic. Patient however states the polyp was benign and her next colonoscopy would be 10 yrs.  Review of Systems See hpi Current Outpatient Prescriptions  Medication Sig Dispense Refill  . buPROPion (WELLBUTRIN XL) 300 MG 24 hr tablet Take 300 mg by mouth daily.        . cycloSPORINE (RESTASIS) 0.05 % ophthalmic emulsion Place 1 drop into both eyes 2 (two) times daily.        Marland Kitchen omeprazole (PRILOSEC) 20 MG capsule       . dexlansoprazole (DEXILANT) 60 MG capsule Take 1 capsule (60 mg total) by mouth daily.  30 capsule  5   No current facility-administered medications for this visit.   Past Medical History  Diagnosis Date  . Depression   . Arthritis   . GERD (gastroesophageal reflux disease)   . Dysphagia    Past Surgical History  Procedure Laterality Date  . Neck surgery      2007  . Breast surgery      benign.  25 yrs ago  . Laproscopy      over 20 yrs ago  . Dental implants    . Carpal tunnel release  1980's    bilateral   . Finger surgery      trigger finger 3rd  bilaterally   Allergies  Allergen Reactions  . Codeine Other (See  Comments)    Sweating and Patient just doesn't like.        Objective:   Physical Exam  Filed Vitals:   10/14/12 1407  BP: 138/82  Pulse: 76  Height: 5\' 4"  (1.626 m)  Weight: 126 lb 12.8 oz (57.516 kg)  Alert and oriented. Skin warm and dry. Oral mucosa is moist.   . Sclera anicteric, conjunctivae is pink. Thyroid not enlarged. No cervical lymphadenopathy. Lungs clear. Heart regular rate and rhythm.  Abdomen is soft. Bowel sounds are positive. No hepatomegaly. No abdominal masses felt. No tenderness.  No edema to lower extremities.        Assessment:    Change in her stools. Colonic neoplasm needs to be ruled out.     Plan:    Colonoscopy with Dr. Karilyn Cota.

## 2012-10-14 NOTE — Patient Instructions (Addendum)
Colonoscopy with Dr. Rehman. The risks and benefits such as perforation, bleeding, and infection were reviewed with the patient and is agreeable. 

## 2012-10-14 NOTE — Telephone Encounter (Signed)
Patient needs movi prep 

## 2012-10-15 DIAGNOSIS — R195 Other fecal abnormalities: Secondary | ICD-10-CM | POA: Insufficient documentation

## 2012-10-15 MED ORDER — PEG-KCL-NACL-NASULF-NA ASC-C 100 G PO SOLR: 1.0000 | Freq: Once | ORAL | Status: DC

## 2012-10-29 ENCOUNTER — Encounter (HOSPITAL_COMMUNITY)
Admission: RE | Disposition: A | Discharge status: Home / Self Care | Payer: Self-pay | Source: Ambulatory Visit | Attending: Internal Medicine

## 2012-10-29 ENCOUNTER — Ambulatory Visit (HOSPITAL_COMMUNITY)
Admission: RE | Admit: 2012-10-29 | Discharge: 2012-10-29 | Disposition: A | Discharge status: Home / Self Care | Payer: Medicare Other | Source: Ambulatory Visit | Attending: Internal Medicine | Admitting: Internal Medicine

## 2012-10-29 ENCOUNTER — Encounter (HOSPITAL_COMMUNITY): Payer: Self-pay | Admitting: *Deleted

## 2012-10-29 DIAGNOSIS — R198 Other specified symptoms and signs involving the digestive system and abdomen: Secondary | ICD-10-CM | POA: Insufficient documentation

## 2012-10-29 DIAGNOSIS — R195 Other fecal abnormalities: Secondary | ICD-10-CM

## 2012-10-29 DIAGNOSIS — K5900 Constipation, unspecified: Secondary | ICD-10-CM

## 2012-10-29 DIAGNOSIS — Q438 Other specified congenital malformations of intestine: Secondary | ICD-10-CM | POA: Insufficient documentation

## 2012-10-29 DIAGNOSIS — K5730 Diverticulosis of large intestine without perforation or abscess without bleeding: Secondary | ICD-10-CM

## 2012-10-29 HISTORY — PX: COLONOSCOPY: SHX5424

## 2012-10-29 SURGERY — COLONOSCOPY
Anesthesia: Moderate Sedation

## 2012-10-29 MED ORDER — SODIUM CHLORIDE 0.9 % IV SOLN
INTRAVENOUS | Status: DC
  Administered 2012-10-29: 1000 mL via INTRAVENOUS

## 2012-10-29 MED ORDER — MEPERIDINE HCL 50 MG/ML IJ SOLN
INTRAMUSCULAR | Status: DC | PRN
  Administered 2012-10-29 (×2): 25 mg via INTRAVENOUS

## 2012-10-29 MED ORDER — MIDAZOLAM HCL 5 MG/5ML IJ SOLN
INTRAMUSCULAR | Status: DC | PRN
  Administered 2012-10-29 (×3): 2 mg via INTRAVENOUS

## 2012-10-29 MED ORDER — POLYETHYLENE GLYCOL 1500 POWD: 17.0000 g | Freq: Every day | Status: DC

## 2012-10-29 MED ORDER — MIDAZOLAM HCL 5 MG/5ML IJ SOLN
INTRAMUSCULAR | Status: AC
  Filled 2012-10-29: qty 10

## 2012-10-29 MED ORDER — PSYLLIUM 28 % PO PACK: 1.0000 | PACK | Freq: Every day | ORAL | Status: DC

## 2012-10-29 MED ORDER — MEPERIDINE HCL 50 MG/ML IJ SOLN
INTRAMUSCULAR | Status: AC
  Filled 2012-10-29: qty 1

## 2012-10-29 MED ORDER — STERILE WATER FOR IRRIGATION IR SOLN
Status: DC | PRN
  Administered 2012-10-29: 14:00:00

## 2012-10-29 NOTE — Op Note (Signed)
COLONOSCOPY PROCEDURE REPORT  PATIENT:  Dominique Cobb  MR#:  [id number] Birthdate:  [birth year], 67 y.o., female Endoscopist:  Dr. Malissa Hippo, MD Referred By:  Dr. Colette Ribas, MD  Procedure Date: 10/29/2012  Procedure:   Colonoscopy  Indications:  Patient is 67 year old Caucasian female who presents with change in her bowel habits. Patient's last colonoscopy was in 2005. Family history is negative for CRC.  Informed Consent:  The procedure and risks were reviewed with the patient and informed consent was obtained.  Medications:  Demerol 50 mg IV Versed 6 mg IV  Description of procedure:  After a digital rectal exam was performed, that colonoscope was advanced from the anus through the rectum and colon to the area of the cecum, ileocecal valve and appendiceal orifice. The cecum was deeply intubated. These structures were well-seen and photographed for the record. From the level of the cecum and ileocecal valve, the scope was slowly and cautiously withdrawn. The mucosal surfaces were carefully surveyed utilizing scope tip to flexion to facilitate fold flattening as needed. The scope was pulled down into the rectum where a thorough exam including retroflexion was performed.  Findings:   Prep excellent. Redundant colon with few diverticula at sigmoid colon. No evidence of polyps mass or colonic stricture. Normal rectal mucosa and anal rectal junction.   Therapeutic/Diagnostic Maneuvers Performed:  None  Complications:  None  Cecal Withdrawal Time:  8 minutes  Impression:  Examination performed to cecum. Redundant colon with few diverticula at sigmoid colon.  Recommendations:  Standard instructions given. High-fiber diet. Metamucil 4 g by mouth each bedtime. MiraLax 17 g by mouth daily. If above measures fail we'll consider Lubiprostone or Linaclotide.  REHMAN,NAJEEB U  10/29/2012 2:14 PM  CC: Dr. Phillips Odor, Chancy Hurter, MD & Dr. Bonnetta Barry ref. provider found

## 2012-10-29 NOTE — H&P (Signed)
Dominique Cobb is an 67 y.o. female.   Chief Complaint: Patient's Center for colonoscopy. HPI: Patient is [age]-year-old Caucasian female who presents with change in caliber for stools and constipation started a few months ago. She states that she has not she lost her rhythm. She remains with good appetite. She denies weight loss. She also denies melena or rectal bleeding. Patient's last colonoscopy was in 2005. Family history is negative for colorectal carcinoma.  Past Medical History  Diagnosis Date  . Depression   . Arthritis   . GERD (gastroesophageal reflux disease)   . Dysphagia     Past Surgical History  Procedure Laterality Date  . Neck surgery      2007  . Breast surgery      benign.  25 yrs ago  . Laproscopy      over 20 yrs ago  . Dental implants    . Carpal tunnel release  1980's    bilateral   . Finger surgery      trigger finger 3rd  bilaterally    Family History  Problem Relation Age of Onset  . Arthritis Father   . Thyroid disease Father    Social History:  reports that she has quit smoking. Her smoking use included Cigarettes. She smoked 0.00 packs per day for 20 years. She does not have any smokeless tobacco history on file. She reports that  drinks alcohol. She reports that she does not use illicit drugs.  Allergies:  Allergies  Allergen Reactions  . Codeine Other (See Comments)    Sweating and Patient just doesn't like.     Medications Prior to Admission  Medication Sig Dispense Refill  . buPROPion (WELLBUTRIN XL) 300 MG 24 hr tablet Take 300 mg by mouth daily.        . calcium carbonate 200 MG capsule Take by mouth 2 (two) times daily with a meal.      . cholecalciferol (VITAMIN D) 400 UNITS TABS Take 1,000 Units by mouth.      . cycloSPORINE (RESTASIS) 0.05 % ophthalmic emulsion Place 1 drop into both eyes 2 (two) times daily.        Marland Kitchen ibuprofen (ADVIL,MOTRIN) 200 MG tablet Take 400 mg by mouth every 6 (six) hours as needed for pain.      .  Multiple Vitamin (MULTIVITAMIN WITH MINERALS) TABS Take 1 tablet by mouth daily.      Marland Kitchen omeprazole (PRILOSEC) 20 MG capsule Take 20 mg by mouth daily.       . peg 3350 powder (MOVIPREP) 100 G SOLR Take 1 kit (100 g total) by mouth once.  1 kit  0  . zolpidem (AMBIEN) 10 MG tablet Take 5 mg by mouth at bedtime as needed for sleep.      Marland Kitchen Propylene Glycol (SYSTANE BALANCE OP) Apply 2 drops to eye daily as needed (dry eyes).        No results found for this or any previous visit (from the past 48 hour(s)). No results found.  ROS  Blood pressure 128/78, pulse 75, temperature 97.9 F (36.6 C), temperature source Oral, resp. rate 16, SpO2 97.00%. Physical Exam  Constitutional: She appears well-developed and well-nourished.  HENT:  Mouth/Throat: Oropharynx is clear and moist.  Eyes: Conjunctivae are normal. No scleral icterus.  Neck: No thyromegaly present.  Cardiovascular: Normal rate, regular rhythm and normal heart sounds.   No murmur heard. Respiratory: Effort normal and breath sounds normal.  GI: Soft. She exhibits no  distension and no mass. There is no tenderness.  Musculoskeletal: She exhibits no edema.  Lymphadenopathy:    She has no cervical adenopathy.  Neurological: She is alert.  Skin: Skin is warm and dry.     Assessment/Plan Change in bowel habits. Diagnostic colonoscopy.  Tahirih Lair U 10/29/2012, 1:36 PM

## 2012-11-01 ENCOUNTER — Encounter (HOSPITAL_COMMUNITY): Payer: Self-pay | Admitting: Internal Medicine

## 2013-02-14 ENCOUNTER — Other Ambulatory Visit (HOSPITAL_COMMUNITY): Payer: Self-pay | Admitting: Family Medicine

## 2013-02-14 DIAGNOSIS — Z139 Encounter for screening, unspecified: Secondary | ICD-10-CM

## 2013-02-17 ENCOUNTER — Ambulatory Visit (HOSPITAL_COMMUNITY): Payer: Medicare Other

## 2013-03-07 ENCOUNTER — Ambulatory Visit (HOSPITAL_COMMUNITY)
Admission: RE | Admit: 2013-03-07 | Discharge: 2013-03-07 | Disposition: A | Discharge status: Home / Self Care | Payer: Medicare Other | Source: Ambulatory Visit | Attending: Family Medicine | Admitting: Family Medicine

## 2013-03-07 DIAGNOSIS — Z1231 Encounter for screening mammogram for malignant neoplasm of breast: Secondary | ICD-10-CM | POA: Insufficient documentation

## 2013-03-07 DIAGNOSIS — Z139 Encounter for screening, unspecified: Secondary | ICD-10-CM

## 2014-09-04 ENCOUNTER — Other Ambulatory Visit (HOSPITAL_COMMUNITY): Payer: Self-pay | Admitting: Family Medicine

## 2014-09-04 DIAGNOSIS — Z1231 Encounter for screening mammogram for malignant neoplasm of breast: Secondary | ICD-10-CM

## 2014-09-11 ENCOUNTER — Ambulatory Visit (HOSPITAL_COMMUNITY)
Admission: RE | Admit: 2014-09-11 | Discharge: 2014-09-11 | Disposition: A | Discharge status: Home / Self Care | Payer: Medicare Other | Source: Ambulatory Visit | Attending: Family Medicine | Admitting: Family Medicine

## 2014-09-11 DIAGNOSIS — Z1231 Encounter for screening mammogram for malignant neoplasm of breast: Secondary | ICD-10-CM | POA: Insufficient documentation

## 2014-11-01 ENCOUNTER — Other Ambulatory Visit (HOSPITAL_COMMUNITY): Payer: Self-pay | Admitting: Family Medicine

## 2014-11-01 ENCOUNTER — Ambulatory Visit (HOSPITAL_COMMUNITY)
Admission: RE | Admit: 2014-11-01 | Discharge: 2014-11-01 | Disposition: A | Discharge status: Home / Self Care | Payer: Medicare Other | Source: Ambulatory Visit | Attending: Family Medicine | Admitting: Family Medicine

## 2014-11-01 DIAGNOSIS — R079 Chest pain, unspecified: Secondary | ICD-10-CM | POA: Insufficient documentation

## 2014-11-01 DIAGNOSIS — M542 Cervicalgia: Secondary | ICD-10-CM

## 2014-11-01 DIAGNOSIS — M8580 Other specified disorders of bone density and structure, unspecified site: Secondary | ICD-10-CM

## 2014-11-06 ENCOUNTER — Ambulatory Visit (HOSPITAL_COMMUNITY)
Admission: RE | Admit: 2014-11-06 | Discharge: 2014-11-06 | Disposition: A | Discharge status: Home / Self Care | Payer: Medicare Other | Source: Ambulatory Visit | Attending: Family Medicine | Admitting: Family Medicine

## 2014-11-06 DIAGNOSIS — M542 Cervicalgia: Secondary | ICD-10-CM | POA: Diagnosis not present

## 2014-11-06 DIAGNOSIS — Z780 Asymptomatic menopausal state: Secondary | ICD-10-CM | POA: Diagnosis not present

## 2014-11-06 DIAGNOSIS — M8580 Other specified disorders of bone density and structure, unspecified site: Secondary | ICD-10-CM

## 2015-04-05 ENCOUNTER — Encounter (HOSPITAL_COMMUNITY): Payer: Self-pay | Admitting: Emergency Medicine

## 2015-04-05 ENCOUNTER — Emergency Department (HOSPITAL_COMMUNITY)
Admission: EM | Admit: 2015-04-05 | Discharge: 2015-04-05 | Disposition: A | Discharge status: Home / Self Care | Payer: Medicare Other | Attending: Emergency Medicine | Admitting: Emergency Medicine

## 2015-04-05 ENCOUNTER — Emergency Department (HOSPITAL_COMMUNITY): Payer: Medicare Other

## 2015-04-05 DIAGNOSIS — S92155A Nondisplaced avulsion fracture (chip fracture) of left talus, initial encounter for closed fracture: Secondary | ICD-10-CM | POA: Insufficient documentation

## 2015-04-05 DIAGNOSIS — Y9289 Other specified places as the place of occurrence of the external cause: Secondary | ICD-10-CM | POA: Diagnosis not present

## 2015-04-05 DIAGNOSIS — Z79899 Other long term (current) drug therapy: Secondary | ICD-10-CM | POA: Diagnosis not present

## 2015-04-05 DIAGNOSIS — S92152A Displaced avulsion fracture (chip fracture) of left talus, initial encounter for closed fracture: Secondary | ICD-10-CM

## 2015-04-05 DIAGNOSIS — Y998 Other external cause status: Secondary | ICD-10-CM | POA: Insufficient documentation

## 2015-04-05 DIAGNOSIS — M1990 Unspecified osteoarthritis, unspecified site: Secondary | ICD-10-CM | POA: Diagnosis not present

## 2015-04-05 DIAGNOSIS — K219 Gastro-esophageal reflux disease without esophagitis: Secondary | ICD-10-CM | POA: Insufficient documentation

## 2015-04-05 DIAGNOSIS — F329 Major depressive disorder, single episode, unspecified: Secondary | ICD-10-CM | POA: Insufficient documentation

## 2015-04-05 DIAGNOSIS — S93402A Sprain of unspecified ligament of left ankle, initial encounter: Secondary | ICD-10-CM | POA: Diagnosis not present

## 2015-04-05 DIAGNOSIS — Z87891 Personal history of nicotine dependence: Secondary | ICD-10-CM | POA: Diagnosis not present

## 2015-04-05 DIAGNOSIS — W1839XA Other fall on same level, initial encounter: Secondary | ICD-10-CM | POA: Diagnosis not present

## 2015-04-05 DIAGNOSIS — S99912A Unspecified injury of left ankle, initial encounter: Secondary | ICD-10-CM | POA: Diagnosis present

## 2015-04-05 DIAGNOSIS — Y9301 Activity, walking, marching and hiking: Secondary | ICD-10-CM | POA: Insufficient documentation

## 2015-04-05 MED ORDER — HYDROCODONE-ACETAMINOPHEN 5-325 MG PO TABS: 1.0000 | ORAL_TABLET | Freq: Four times a day (QID) | ORAL | Status: DC | PRN

## 2015-04-05 MED ORDER — HYDROCODONE-ACETAMINOPHEN 5-325 MG PO TABS
2.0000 | ORAL_TABLET | Freq: Once | ORAL | Status: AC
  Administered 2015-04-05: 2 via ORAL
  Filled 2015-04-05: qty 2

## 2015-04-05 MED ORDER — ONDANSETRON 8 MG PO TBDP
8.0000 mg | ORAL_TABLET | Freq: Once | ORAL | Status: AC
  Administered 2015-04-05: 8 mg via ORAL
  Filled 2015-04-05: qty 1

## 2015-04-05 NOTE — ED Provider Notes (Signed)
Medical screening examination/treatment/procedure(s) were conducted as a shared visit with non-physician practitioner(s) and myself.  I personally evaluated the patient during the encounter.   EKG Interpretation None      No results found for this or any previous visit. Dg Ankle Complete Left  04/05/2015  CLINICAL DATA:  Swelling and pain in the left ankle after fall EXAM: LEFT ANKLE COMPLETE - 3+ VIEW COMPARISON:  None. FINDINGS: There is an avulsion fracture at the dorsal distal talus, without significant displacement. There is diffuse left ankle soft tissue swelling. No additional fracture. Left ankle mortise otherwise appears intact. Tiny Achilles left calcaneal spur. IMPRESSION: 1. Nondisplaced dorsal distal talar avulsion fracture. 2. Tiny Achilles left calcaneal spur. Electronically Signed   By: Ilona Sorrel M.D.   On: 04/05/2015 16:54    Patient seen by me. Patient stumbled yesterday and jammed her heel and foot down hard. X-ray show evidence of an avulsion fracture of the talus. Patient difficulty with weightbearing due to the discomfort will treat with posterior splint crutches and follow-up with [facility] she has been followed by them before.  On exam patient has tenderness around the ankle proximal foot dorsalis P is pulses 2+ Refill is 1 second sensation is intact good range of motion of the toes. No proximal leg tenderness. There is some mild swelling to the left ankle area.  Fredia Sorrow, MD 04/05/15 1727

## 2015-04-05 NOTE — ED Provider Notes (Signed)
CSN: [id number]     Arrival date & time 04/05/15  1618 History  By signing my name below, I, Starleen Arms, attest that this documentation has been prepared under the direction and in the presence of Montine Circle, PA-C. Electronically Signed: Starleen Arms ED Scribe. 04/05/2015. 4:40 PM.    No chief complaint on file.  The history is provided by the patient. No language interpreter was used.   HPI Comments: Dominique Cobb is a 69 y.o. female who presents to the Emergency Department complaining of constant, moderate right ankle pain and swelling onset yesterday.  She reports walking through a threshold, not observing that there was a step down, planting her heel heavily, and rolling the heel forward.  She is unable to ambulate due to pain.  There was a prior soft-tissue injury to the ankle 1 month ago  Past Medical History  Diagnosis Date  . Depression   . Arthritis   . GERD (gastroesophageal reflux disease)   . Dysphagia    Past Surgical History  Procedure Laterality Date  . Neck surgery      2007  . Breast surgery      benign.  25 yrs ago  . Laproscopy      over 20 yrs ago  . Dental implants    . Carpal tunnel release  1980's    bilateral   . Finger surgery      trigger finger 3rd  bilaterally  . Colonoscopy N/A 10/29/2012    Procedure: COLONOSCOPY;  Surgeon: Rogene Houston, MD;  Location: [facility];  Service: Endoscopy;  Laterality: N/A;  140   Family History  Problem Relation Age of Onset  . Arthritis Father   . Thyroid disease Father    Social History  Substance Use Topics  . Smoking status: Former Smoker -- 20 years    Types: Cigarettes  . Smokeless tobacco: Not on file     Comment: quit 7 yrs ago  . Alcohol Use: Yes     Comment: occasionally+   OB History    No data available     Review of Systems  Constitutional: Negative for fever.  Musculoskeletal: Positive for joint swelling and arthralgias.      Allergies  Codeine  Home Medications    Prior to Admission medications   Medication Sig Start Date End Date Taking? Authorizing Provider  buPROPion (WELLBUTRIN XL) 300 MG 24 hr tablet Take 300 mg by mouth daily.      Historical Provider, MD  calcium carbonate 200 MG capsule Take by mouth 2 (two) times daily with a meal.    Historical Provider, MD  cholecalciferol (VITAMIN D) 400 UNITS TABS Take 1,000 Units by mouth.    Historical Provider, MD  cycloSPORINE (RESTASIS) 0.05 % ophthalmic emulsion Place 1 drop into both eyes 2 (two) times daily.      Historical Provider, MD  ibuprofen (ADVIL,MOTRIN) 200 MG tablet Take 400 mg by mouth every 6 (six) hours as needed for pain.    Historical Provider, MD  Multiple Vitamin (MULTIVITAMIN WITH MINERALS) TABS Take 1 tablet by mouth daily.    Historical Provider, MD  omeprazole (PRILOSEC) 20 MG capsule Take 20 mg by mouth daily.  04/14/12   Rogene Houston, MD  Polyethylene Glycol 1500 POWD 17 g by Does not apply route at bedtime. 10/29/12   Rogene Houston, MD  Propylene Glycol (SYSTANE BALANCE OP) Apply 2 drops to eye daily as needed (dry eyes).    Historical  Provider, MD  psyllium (METAMUCIL SMOOTH TEXTURE) 28 % packet Take 1 packet by mouth at bedtime. 10/29/12   Rogene Houston, MD  zolpidem (AMBIEN) 10 MG tablet Take 5 mg by mouth at bedtime as needed for sleep.    Historical Provider, MD   There were no vitals taken for this visit. Physical Exam  Constitutional: She is oriented to person, place, and time. She appears well-developed and well-nourished. No distress.  HENT:  Head: Normocephalic and atraumatic.  Eyes: Conjunctivae and EOM are normal.  Neck: Neck supple. No tracheal deviation present.  Cardiovascular: Normal rate and intact distal pulses.   2+ distal pulses, brisk cap refill  Pulmonary/Chest: Effort normal. No respiratory distress.  Musculoskeletal: Normal range of motion.  Left ankle ttp over the medial and lateral aspects, increased swelling laterally, ROM and strength  limited 2/2 pain  Neurological: She is alert and oriented to person, place, and time.  Sensation intact  Skin: Skin is warm and dry.  Skin intact  Psychiatric: She has a normal mood and affect. Her behavior is normal.  Nursing note and vitals reviewed.   ED Course  Procedures (including critical care time)  DIAGNOSTIC STUDIES: Oxygen Saturation is 99% on RA, normal by my interpretation.    COORDINATION OF CARE:  4:38 PM Will order imaging and pain medication.  Patient acknowledges and agrees with plan.    Imaging Review Dg Ankle Complete Left  04/05/2015  CLINICAL DATA:  Swelling and pain in the left ankle after fall EXAM: LEFT ANKLE COMPLETE - 3+ VIEW COMPARISON:  None. FINDINGS: There is an avulsion fracture at the dorsal distal talus, without significant displacement. There is diffuse left ankle soft tissue swelling. No additional fracture. Left ankle mortise otherwise appears intact. Tiny Achilles left calcaneal spur. IMPRESSION: 1. Nondisplaced dorsal distal talar avulsion fracture. 2. Tiny Achilles left calcaneal spur. Electronically Signed   By: Ilona Sorrel M.D.   On: 04/05/2015 16:54   I have personally reviewed and evaluated these images and lab results as part of my medical decision-making.   MDM   Final diagnoses:  Avulsion fracture of talus, left, closed, initial encounter  Ankle sprain, left, initial encounter    Patient with left ankle pain following mechanical fall.  Plain films remarkable for a talus avulsion fracture. I discussed this with Dr. Rogene Houston, who recommends orthopedic follow-up. Will give patient a posterior splint, crutches, and instruct her to be nonweightbearing. There is a potential need that the patient will need to have a CT scan for further evaluation. Will have patient inquire about this at her orthopedic follow-up visit.  I personally performed the services described in this documentation, which was scribed in my presence. The recorded  information has been reviewed and is accurate.       Montine Circle, PA-C 04/05/15 1725

## 2015-04-05 NOTE — ED Notes (Signed)
Stepped over an incline at a restaurant and felt like she "jammed her ankle." States it didn't roll in any specific direction, recently injured same ankle. Left ankle swollen, mildly bruised, able to move toes, pulse sensation intact.

## 2015-04-05 NOTE — ED Notes (Signed)
Patient states that she fell yesterday and twisted her right ankle. Ankle is swollen and bruised.

## 2015-04-05 NOTE — Discharge Instructions (Signed)
You need to follow-up with orthopedics next week.  Do not walk on your affected leg.  Use crutches.  Take pain medications as directed.  Ankle Sprain An ankle sprain is an injury to the strong, fibrous tissues (ligaments) that hold the bones of your ankle joint together.  CAUSES An ankle sprain is usually caused by a fall or by twisting your ankle. Ankle sprains most commonly occur when you step on the outer edge of your foot, and your ankle turns inward. People who participate in sports are more prone to these types of injuries.  SYMPTOMS   Pain in your ankle. The pain may be present at rest or only when you are trying to stand or walk.  Swelling.  Bruising. Bruising may develop immediately or within 1 to 2 days after your injury.  Difficulty standing or walking, particularly when turning corners or changing directions. DIAGNOSIS  Your caregiver will ask you details about your injury and perform a physical exam of your ankle to determine if you have an ankle sprain. During the physical exam, your caregiver will press on and apply pressure to specific areas of your foot and ankle. Your caregiver will try to move your ankle in certain ways. An X-ray exam may be done to be sure a bone was not broken or a ligament did not separate from one of the bones in your ankle (avulsion fracture).  TREATMENT  Certain types of braces can help stabilize your ankle. Your caregiver can make a recommendation for this. Your caregiver may recommend the use of medicine for pain. If your sprain is severe, your caregiver may refer you to a surgeon who helps to restore function to parts of your skeletal system (orthopedist) or a physical therapist. [place] ice to your injury for 1-2 days or as directed by your caregiver. Applying ice helps to reduce inflammation and pain.  Put ice in a plastic bag.  Place a towel between your skin and the bag.  Leave the ice on for 15-20 minutes at a time,  every 2 hours while you are awake.  Only take over-the-counter or prescription medicines for pain, discomfort, or fever as directed by your caregiver.  Elevate your injured ankle above the level of your heart as much as possible for 2-3 days.  If your caregiver recommends crutches, use them as instructed. Gradually put weight on the affected ankle. Continue to use crutches or a cane until you can walk without feeling pain in your ankle.  If you have a plaster splint, wear the splint as directed by your caregiver. Do not rest it on anything harder than a pillow for the first 24 hours. Do not put weight on it. Do not get it wet. You may take it off to take a shower or bath.  You may have been given an elastic bandage to wear around your ankle to provide support. If the elastic bandage is too tight (you have numbness or tingling in your foot or your foot becomes cold and blue), adjust the bandage to make it comfortable.  If you have an air splint, you may blow more air into it or let air out to make it more comfortable. You may take your splint off at night and before taking a shower or bath. Wiggle your toes in the splint several times per day to decrease swelling. SEEK MEDICAL CARE IF:   You have rapidly increasing bruising or swelling.  Your toes feel extremely cold or  you lose feeling in your foot.  Your pain is not relieved with medicine. SEEK IMMEDIATE MEDICAL CARE IF:  Your toes are numb or blue.  You have severe pain that is increasing. MAKE SURE YOU:   Understand these instructions.  Will watch your condition.  Will get help right away if you are not doing well or get worse.   This information is not intended to replace advice given to you by your health care provider. Make sure you discuss any questions you have with your health care provider.   Document Released: 05/19/2005 Document Revised: 06/09/2014 Document Reviewed: 05/31/2011 Elsevier Interactive Patient Education  2016 [employer] Ankle Fracture A fracture is a break in a bone. The ankle joint is made up of three bones. These include the lower (distal)sections of your lower leg bones, called the tibia and fibula, along with a bone in your foot, called the talus. Depending on how bad the break is and if more than one ankle joint bone is broken, a cast or splint is used to protect and keep your injured bone from moving while it heals. Sometimes, surgery is required to help the fracture heal properly.  There are two general types of fractures:  Stable fracture. This includes a single fracture line through one bone, with no injury to ankle ligaments. A fracture of the talus that does not have any displacement (movement of the bone on either side of the fracture line) is also stable.  Unstable fracture. This includes more than one fracture line through one or more bones in the ankle joint. It also includes fractures that have displacement of the bone on either side of the fracture line. CAUSES  A direct blow to the ankle.   Quickly and severely twisting your ankle.  Trauma, such as a car accident or falling from a significant height. RISK FACTORS You may be at a higher risk of ankle fracture if:  You have certain medical conditions.  You are involved in high-impact sports.  You are involved in a high-impact car accident. SIGNS AND SYMPTOMS   Tender and swollen ankle.  Bruising around the injured ankle.  Pain on movement of the ankle.  Difficulty walking or putting weight on the ankle.  A cold foot below the site of the ankle injury. This can occur if the blood vessels passing through your injured ankle were also damaged.  Numbness in the foot below the site of the ankle injury. DIAGNOSIS  An ankle fracture is usually diagnosed with a physical exam and X-rays. A CT scan may also be required for complex fractures. TREATMENT  Stable fractures are treated with a cast or splint and using  crutches to avoid putting weight on your injured ankle. This is followed by an ankle strengthening program. Some patients require a special type of cast, depending on other medical problems they may have. Unstable fractures require surgery to ensure the bones heal properly. Your health care provider will tell you what type of fracture you have and the best treatment for your condition. HOME CARE INSTRUCTIONS   Review correct crutch use with your health care provider and use your crutches as directed. Safe use of crutches is extremely important. Misuse of crutches can cause you to fall or cause injury to nerves in your hands or armpits.  Do not put weight or pressure on the injured ankle until directed by your health care provider.  To lessen the swelling, keep the injured leg elevated while sitting or lying down.  Apply ice to the injured area:  Put ice in a plastic bag.  Place a towel between your cast and the bag.  Leave the ice on for 20 minutes, 2-3 times a day.  If you have a plaster or fiberglass cast:  Do not try to scratch the skin under the cast with any objects. This can increase your risk of skin infection.  Check the skin around the cast every day. You may put lotion on any red or sore areas.  Keep your cast dry and clean.  If you have a plaster splint:  Wear the splint as directed.  You may loosen the elastic around the splint if your toes become numb, tingle, or turn cold or blue.  Do not put pressure on any part of your cast or splint; it may break. Rest your cast only on a pillow the first 24 hours until it is fully hardened.  Your cast or splint can be protected during bathing with a plastic bag sealed to your skin with medical tape. Do not lower the cast or splint into water.  Take medicines as directed by your health care provider. Only take over-the-counter or prescription medicines for pain, discomfort, or fever as directed by your health care provider.  Do  not drive a vehicle until your health care provider specifically tells you it is safe to do so.  If your health care provider has given you a follow-up appointment, it is very important to keep that appointment. Not keeping the appointment could result in a chronic or permanent injury, pain, and disability. If you have any problem keeping the appointment, call the facility for assistance. SEEK MEDICAL CARE IF: You develop increased swelling or discomfort. SEEK IMMEDIATE MEDICAL CARE IF:   Your cast gets damaged or breaks.  You have continued severe pain.  You develop new pain or swelling after the cast was put on.  Your skin or toenails below the injury turn blue or gray.  Your skin or toenails below the injury feel cold, numb, or have loss of sensitivity to touch.  There is a bad smell or pus draining from under the cast. MAKE SURE YOU:   Understand these instructions.  Will watch your condition.  Will get help right away if you are not doing well or get worse.   This information is not intended to replace advice given to you by your health care provider. Make sure you discuss any questions you have with your health care provider.   Document Released: 05/16/2000 Document Revised: 05/24/2013 Document Reviewed: 12/16/2012 Elsevier Interactive Patient Education [employer].

## 2015-12-07 ENCOUNTER — Other Ambulatory Visit: Payer: Self-pay | Admitting: Family Medicine

## 2015-12-07 DIAGNOSIS — Z1231 Encounter for screening mammogram for malignant neoplasm of breast: Secondary | ICD-10-CM

## 2015-12-11 ENCOUNTER — Ambulatory Visit
Admission: RE | Admit: 2015-12-11 | Discharge: 2015-12-11 | Disposition: A | Discharge status: Home / Self Care | Payer: Medicare Other | Source: Ambulatory Visit | Attending: Family Medicine | Admitting: Family Medicine

## 2015-12-11 DIAGNOSIS — Z1231 Encounter for screening mammogram for malignant neoplasm of breast: Secondary | ICD-10-CM

## 2016-03-31 ENCOUNTER — Ambulatory Visit (INDEPENDENT_AMBULATORY_CARE_PROVIDER_SITE_OTHER): Payer: Medicare Other | Admitting: Otolaryngology

## 2016-03-31 DIAGNOSIS — H903 Sensorineural hearing loss, bilateral: Secondary | ICD-10-CM

## 2016-06-05 NOTE — Patient Instructions (Signed)
Dominique Cobb  06/05/2016     @PREFPERIOPPHARMACY @   Your procedure is scheduled on 06/12/2016.  Report to [facility] at 6:30 A.M.  Call this number if you have problems the morning of surgery:  [phone]   Remember:  Do not eat food or drink liquids after midnight.  Take these medicines the morning of surgery with A SIP OF WATER wellbutrin, lexapro   Do not wear jewelry, make-up or nail polish.  Do not wear lotions, powders, or perfumes, or deoderant.  Do not shave 48 hours prior to surgery.  Men may shave face and neck.  Do not bring valuables to the hospital.  [facility] is not responsible for any belongings or valuables.  Contacts, dentures or bridgework may not be worn into surgery.  Leave your suitcase in the car.  After surgery it may be brought to your room.  For patients admitted to the hospital, discharge time will be determined by your treatment team.  Patients discharged the day of surgery will not be allowed to drive home.    Please read over the following fact sheets that you were given. Anesthesia Post-op Instructions     PATIENT INSTRUCTIONS POST-ANESTHESIA  IMMEDIATELY FOLLOWING SURGERY:  Do not drive or operate machinery for the first twenty four hours after surgery.  Do not make any important decisions for twenty four hours after surgery or while taking narcotic pain medications or sedatives.  If you develop intractable nausea and vomiting or a severe headache please notify your doctor immediately.  FOLLOW-UP:  Please make an appointment with your surgeon as instructed. You do not need to follow up with anesthesia unless specifically instructed to do so.  WOUND CARE INSTRUCTIONS (if applicable):  Keep a dry clean dressing on the anesthesia/puncture wound site if there is drainage.  Once the wound has quit draining you may leave it open to air.  Generally you should leave the bandage intact for twenty four hours unless there is drainage.  If the  epidural site drains for more than 36-48 hours please call the anesthesia department.  QUESTIONS?:  Please feel free to call your physician or the hospital operator if you have any questions, and they will be happy to assist you.      Cataract Surgery Cataract surgery is a procedure to remove a cataract from your eye. A cataract is cloudiness on the lens of your eye. The lens focuses light inside the eye. When a lens becomes cloudy, your vision is affected. Cataract surgery is a procedure to remove the cloudy lens. A substitute lens (intraocular lens or IOL) is usually inserted as a replacement for the cloudy lens. Tell a health care provider about:  Any allergies you have.  All medicines you are taking, including vitamins, herbs, eye drops, creams, and over-the-counter medicines.  Any problems you or family members have had with anesthetic medicines.  Any blood disorders you have.  Any surgeries you have had, especially eye surgeries that include refractive surgery, such as PRK and LASIK.  Any medical conditions you have.  Whether you are pregnant or may be pregnant. What are the risks? Generally, this is a safe procedure. However, problems may occur, including:  Infection.  Bleeding.  Glaucoma.  Retinal detachment.  Allergic reactions to medicines.  Damage to other structures or organs.  Inflammation of the eye.  Clouding of the part of your eye that holds an IOL in place (after-cataract), if an IOL was inserted. This is fairly common.  An IOL moving out of position, if an IOL was inserted. This is very rare.  Loss of vision. This is rare. What happens before the procedure?  Follow instructions from your health care provider about eating or drinking restrictions.  Ask your health care provider about:  Changing or stopping your regular medicines, including any eye drops you have been prescribed. This is especially important if you are taking diabetes medicines or  blood thinners.  Taking medicines such as aspirin and ibuprofen. These medicines can thin your blood. Do not take these medicines before your procedure if your health care provider instructs you not to.  Do not put contact lenses in either eye on the day of your surgery.  Plan for someone to drive you to and from the procedure.  If you will be going home right after the procedure, plan to have someone with you for 24 hours. What happens during the procedure?  An IV tube may be inserted into one of your veins.  You will be given one or more of the following:  A medicine to help you relax (sedative).  A medicine to numb the area (local anesthetic). This may be numbing eye drops or an injection that is given behind the eye.  A small cut (incision) will be made to the edge of the clear, dome-shaped surface that covers the front of the eye (cornea).  A small probe will be inserted into the eye. This device gives off ultrasound waves that soften and break up the cloudy center of the lens. This makes it easier for the cloudy lens to be removed by suction.  An IOL may be implanted.  Part of the capsule that surrounds the lens will be left in the eye to support the IOL.  Your surgeon may use stitches (sutures) to close the incision. The procedure may vary among health care providers and hospitals. What happens after the procedure?  Your blood pressure, heart rate, breathing rate, and blood oxygen level will be monitored often until the medicines you were given have worn off.  You may be given a protective shield to wear over your eyes.  Do not drive for 24 hours if you received a sedative. This information is not intended to replace advice given to you by your health care provider. Make sure you discuss any questions you have with your health care provider. Document Released: 05/08/2011 Document Revised: 10/25/2015 Document Reviewed: 03/29/2015 Elsevier Interactive Patient Education   2017 [employer].

## 2016-06-09 ENCOUNTER — Other Ambulatory Visit: Payer: Self-pay

## 2016-06-09 ENCOUNTER — Encounter (HOSPITAL_COMMUNITY): Payer: Self-pay

## 2016-06-09 ENCOUNTER — Encounter (HOSPITAL_COMMUNITY)
Admission: RE | Admit: 2016-06-09 | Discharge: 2016-06-09 | Disposition: A | Discharge status: Home / Self Care | Payer: Medicare Other | Source: Ambulatory Visit | Attending: Ophthalmology | Admitting: Ophthalmology

## 2016-06-09 DIAGNOSIS — I499 Cardiac arrhythmia, unspecified: Secondary | ICD-10-CM

## 2016-06-09 DIAGNOSIS — F329 Major depressive disorder, single episode, unspecified: Secondary | ICD-10-CM | POA: Diagnosis not present

## 2016-06-09 DIAGNOSIS — Z01810 Encounter for preprocedural cardiovascular examination: Secondary | ICD-10-CM

## 2016-06-09 DIAGNOSIS — Z01818 Encounter for other preprocedural examination: Secondary | ICD-10-CM | POA: Insufficient documentation

## 2016-06-09 DIAGNOSIS — Z87891 Personal history of nicotine dependence: Secondary | ICD-10-CM | POA: Diagnosis not present

## 2016-06-09 DIAGNOSIS — K219 Gastro-esophageal reflux disease without esophagitis: Secondary | ICD-10-CM | POA: Diagnosis not present

## 2016-06-09 DIAGNOSIS — H2512 Age-related nuclear cataract, left eye: Secondary | ICD-10-CM | POA: Diagnosis not present

## 2016-06-09 DIAGNOSIS — Z79899 Other long term (current) drug therapy: Secondary | ICD-10-CM | POA: Diagnosis not present

## 2016-06-09 DIAGNOSIS — M1990 Unspecified osteoarthritis, unspecified site: Secondary | ICD-10-CM | POA: Diagnosis not present

## 2016-06-09 DIAGNOSIS — R9431 Abnormal electrocardiogram [ECG] [EKG]: Secondary | ICD-10-CM

## 2016-06-09 LAB — CBC
HEMATOCRIT: 43.4 % (ref 36.0–46.0)
Hemoglobin: 14.3 g/dL (ref 12.0–15.0)
MCH: 31.6 pg (ref 26.0–34.0)
MCHC: 32.9 g/dL (ref 30.0–36.0)
MCV: 96 fL (ref 78.0–100.0)
PLATELETS: 240 10*3/uL (ref 150–400)
RBC: 4.52 MIL/uL (ref 3.87–5.11)
RDW: 12.2 % (ref 11.5–15.5)
WBC: 6 10*3/uL (ref 4.0–10.5)

## 2016-06-09 LAB — BASIC METABOLIC PANEL
Anion gap: 8 (ref 5–15)
BUN: 17 mg/dL (ref 6–20)
CALCIUM: 9.2 mg/dL (ref 8.9–10.3)
CO2: 25 mmol/L (ref 22–32)
CREATININE: 0.72 mg/dL (ref 0.44–1.00)
Chloride: 107 mmol/L (ref 101–111)
GLUCOSE: 90 mg/dL (ref 65–99)
Potassium: 3.7 mmol/L (ref 3.5–5.1)
Sodium: 140 mmol/L (ref 135–145)

## 2016-06-12 ENCOUNTER — Encounter (HOSPITAL_COMMUNITY): Payer: Self-pay | Admitting: *Deleted

## 2016-06-12 ENCOUNTER — Ambulatory Visit (HOSPITAL_COMMUNITY)
Admission: RE | Admit: 2016-06-12 | Discharge: 2016-06-12 | Disposition: A | Discharge status: Home / Self Care | Payer: Medicare Other | Source: Ambulatory Visit | Attending: Ophthalmology | Admitting: Ophthalmology

## 2016-06-12 ENCOUNTER — Ambulatory Visit (HOSPITAL_COMMUNITY): Payer: Medicare Other | Admitting: Anesthesiology

## 2016-06-12 ENCOUNTER — Encounter (HOSPITAL_COMMUNITY)
Admission: RE | Disposition: A | Discharge status: Home / Self Care | Payer: Self-pay | Source: Ambulatory Visit | Attending: Ophthalmology

## 2016-06-12 DIAGNOSIS — K219 Gastro-esophageal reflux disease without esophagitis: Secondary | ICD-10-CM | POA: Diagnosis not present

## 2016-06-12 DIAGNOSIS — Z79899 Other long term (current) drug therapy: Secondary | ICD-10-CM | POA: Insufficient documentation

## 2016-06-12 DIAGNOSIS — H2512 Age-related nuclear cataract, left eye: Secondary | ICD-10-CM | POA: Insufficient documentation

## 2016-06-12 DIAGNOSIS — Z87891 Personal history of nicotine dependence: Secondary | ICD-10-CM | POA: Diagnosis not present

## 2016-06-12 DIAGNOSIS — F329 Major depressive disorder, single episode, unspecified: Secondary | ICD-10-CM | POA: Diagnosis not present

## 2016-06-12 DIAGNOSIS — M1990 Unspecified osteoarthritis, unspecified site: Secondary | ICD-10-CM | POA: Insufficient documentation

## 2016-06-12 HISTORY — PX: CATARACT EXTRACTION W/PHACO: SHX586

## 2016-06-12 SURGERY — PHACOEMULSIFICATION, CATARACT, WITH IOL INSERTION
Anesthesia: Monitor Anesthesia Care | Site: Eye | Laterality: Left

## 2016-06-12 MED ORDER — PROVISC 10 MG/ML IO SOLN
INTRAOCULAR | Status: DC | PRN
  Administered 2016-06-12: .85 mL via INTRAOCULAR

## 2016-06-12 MED ORDER — MIDAZOLAM HCL 2 MG/2ML IJ SOLN
INTRAMUSCULAR | Status: AC
  Filled 2016-06-12: qty 2

## 2016-06-12 MED ORDER — LIDOCAINE HCL (PF) 1 % IJ SOLN
INTRAMUSCULAR | Status: DC | PRN
  Administered 2016-06-12: .3 mL

## 2016-06-12 MED ORDER — BSS IO SOLN
INTRAOCULAR | Status: DC | PRN
  Administered 2016-06-12: 15 mL via INTRAOCULAR

## 2016-06-12 MED ORDER — PHENYLEPHRINE HCL 2.5 % OP SOLN
1.0000 [drp] | OPHTHALMIC | Status: AC
  Administered 2016-06-12 (×3): 1 [drp] via OPHTHALMIC

## 2016-06-12 MED ORDER — FENTANYL CITRATE (PF) 100 MCG/2ML IJ SOLN
25.0000 ug | INTRAMUSCULAR | Status: AC | PRN
  Administered 2016-06-12 (×2): 25 ug via INTRAVENOUS

## 2016-06-12 MED ORDER — EPINEPHRINE PF 1 MG/ML IJ SOLN
INTRAOCULAR | Status: DC | PRN
  Administered 2016-06-12: 500 mL

## 2016-06-12 MED ORDER — NEOMYCIN-POLYMYXIN-DEXAMETH 3.5-10000-0.1 OP SUSP
OPHTHALMIC | Status: DC | PRN
  Administered 2016-06-12: 2 [drp] via OPHTHALMIC

## 2016-06-12 MED ORDER — LACTATED RINGERS IV SOLN
INTRAVENOUS | Status: DC
  Administered 2016-06-12: 1000 mL via INTRAVENOUS

## 2016-06-12 MED ORDER — TETRACAINE HCL 0.5 % OP SOLN
1.0000 [drp] | OPHTHALMIC | Status: AC
  Administered 2016-06-12 (×3): 1 [drp] via OPHTHALMIC

## 2016-06-12 MED ORDER — FENTANYL CITRATE (PF) 100 MCG/2ML IJ SOLN
INTRAMUSCULAR | Status: AC
  Filled 2016-06-12: qty 2

## 2016-06-12 MED ORDER — CYCLOPENTOLATE-PHENYLEPHRINE 0.2-1 % OP SOLN
1.0000 [drp] | OPHTHALMIC | Status: AC
  Administered 2016-06-12 (×3): 1 [drp] via OPHTHALMIC

## 2016-06-12 MED ORDER — MIDAZOLAM HCL 2 MG/2ML IJ SOLN
0.5000 mg | INTRAMUSCULAR | Status: DC | PRN
  Administered 2016-06-12: 2 mg via INTRAVENOUS

## 2016-06-12 MED ORDER — LIDOCAINE HCL 3.5 % OP GEL
1.0000 "application " | Freq: Once | OPHTHALMIC | Status: AC
  Administered 2016-06-12: 1 via OPHTHALMIC

## 2016-06-12 MED ORDER — EPINEPHRINE PF 1 MG/ML IJ SOLN
INTRAMUSCULAR | Status: AC
  Filled 2016-06-12: qty 1

## 2016-06-12 MED ORDER — POVIDONE-IODINE 5 % OP SOLN
OPHTHALMIC | Status: DC | PRN
  Administered 2016-06-12: 1 via OPHTHALMIC

## 2016-06-12 SURGICAL SUPPLY — 21 items

## 2016-06-12 NOTE — Op Note (Signed)
Date of Admission: 06/12/2016  Date of Surgery: 06/12/2016  Pre-Op Dx: Cataract Left  Eye  Post-Op Dx: Senile Nuclear Cataract  Left  Eye,  Dx Code H25.12  Surgeon: Tonny Branch, M.D.  Assistants: None  Anesthesia: Topical with MAC  Indications: Painless, progressive loss of vision with compromise of daily activities.  Surgery: Cataract Extraction with Intraocular lens Implant Left Eye  Discription: The patient had dilating drops and viscous lidocaine placed into the Left eye in the pre-op holding area. After transfer to the operating room, a time out was performed. The patient was then prepped and draped. Beginning with a 74 degree blade a paracentesis port was made at the surgeon's 2 o'clock position. The anterior chamber was then filled with 1% non-preserved lidocaine. This was followed by filling the anterior chamber with Provisc.  A 2.40m keratome blade was used to make a clear corneal incision at the temporal limbus.  A bent cystatome needle was used to create a continuous tear capsulotomy. Hydrodissection was performed with balanced salt solution on a Fine canula. The lens nucleus was then removed using the phacoemulsification handpiece. Residual cortex was removed with the I&A handpiece. The anterior chamber and capsular bag were refilled with Provisc. A posterior chamber intraocular lens was placed into the capsular bag with it's injector. The implant was positioned with the Kuglan hook. The Provisc was then removed from the anterior chamber and capsular bag with the I&A handpiece. Stromal hydration of the main incision and paracentesis port was performed with BSS on a Fine canula. The wounds were tested for leak which was negative. The patient tolerated the procedure well. There were no operative complications. The patient was then transferred to the recovery room in stable condition.  Complications: None  Specimen: None  EBL: None  Prosthetic device: Abbott Technis, PCB00, power  25.5, SN [id number]

## 2016-06-12 NOTE — Anesthesia Postprocedure Evaluation (Signed)
Anesthesia Post Note  Patient: TAYNA SMETHURST  Procedure(s) Performed: Procedure(s) (LRB): CATARACT EXTRACTION PHACO AND INTRAOCULAR LENS PLACEMENT LEFT EYE (Left)  Patient location during evaluation: Short Stay Anesthesia Type: MAC Level of consciousness: awake and alert, oriented and patient cooperative Pain management: pain level controlled Vital Signs Assessment: post-procedure vital signs reviewed and stable Respiratory status: spontaneous breathing, nonlabored ventilation and respiratory function stable Cardiovascular status: blood pressure returned to baseline Postop Assessment: no signs of nausea or vomiting Anesthetic complications: no     Last Vitals:  Vitals:   06/12/16 0830 06/12/16 0845  BP: 126/72   Resp: (!) 27 10  Temp:      Last Pain:  Vitals:   06/12/16 0745  TempSrc: Oral                 Zyionna Pesce J

## 2016-06-12 NOTE — H&P (Signed)
I have reviewed the H&P, the patient was re-examined, and I have identified no interval changes in medical condition and plan of care since the history and physical of record  

## 2016-06-12 NOTE — Discharge Instructions (Signed)

## 2016-06-12 NOTE — Anesthesia Preprocedure Evaluation (Signed)
Anesthesia Evaluation  Patient identified by MRN, date of birth, ID band Patient awake    Reviewed: Allergy & Precautions, NPO status , Patient's Chart, lab work & pertinent test results  Airway Mallampati: I  TM Distance: >3 FB     Dental  (+) Teeth Intact   Pulmonary former smoker,    breath sounds clear to auscultation       Cardiovascular negative cardio ROS   Rhythm:Regular Rate:Normal     Neuro/Psych PSYCHIATRIC DISORDERS Depression    GI/Hepatic GERD  Medicated,  Endo/Other    Renal/GU      Musculoskeletal  (+) Arthritis ,   Abdominal   Peds  Hematology   Anesthesia Other Findings   Reproductive/Obstetrics                             Anesthesia Physical Anesthesia Plan  ASA: II  Anesthesia Plan: MAC   Post-op Pain Management:    Induction: Intravenous  Airway Management Planned: Nasal Cannula  Additional Equipment:   Intra-op Plan:   Post-operative Plan:   Informed Consent: I have reviewed the patients History and Physical, chart, labs and discussed the procedure including the risks, benefits and alternatives for the proposed anesthesia with the patient or authorized representative who has indicated his/her understanding and acceptance.     Plan Discussed with:   Anesthesia Plan Comments:         Anesthesia Quick Evaluation

## 2016-06-12 NOTE — Transfer of Care (Signed)
Immediate Anesthesia Transfer of Care Note  Patient: Dominique Cobb  Procedure(s) Performed: Procedure(s) with comments: CATARACT EXTRACTION PHACO AND INTRAOCULAR LENS PLACEMENT LEFT EYE (Left) - left  Patient Location: Short Stay  Anesthesia Type:MAC  Level of Consciousness: awake, alert , oriented and patient cooperative  Airway & Oxygen Therapy: Patient Spontanous Breathing  Post-op Assessment: Report given to RN, Post -op Vital signs reviewed and stable and Patient moving all extremities  Post vital signs: Reviewed and stable  Last Vitals:  Vitals:   06/12/16 0830 06/12/16 0845  BP: 126/72   Resp: (!) 27 10  Temp:      Last Pain:  Vitals:   06/12/16 0745  TempSrc: Oral      Patients Stated Pain Goal: 8 (74/08/14 4818)  Complications: No apparent anesthesia complications

## 2016-06-13 ENCOUNTER — Encounter (HOSPITAL_COMMUNITY): Payer: Self-pay | Admitting: Ophthalmology

## 2016-07-02 ENCOUNTER — Encounter: Payer: Self-pay | Admitting: Internal Medicine

## 2016-07-10 ENCOUNTER — Encounter (HOSPITAL_COMMUNITY)
Admission: RE | Admit: 2016-07-10 | Discharge: 2016-07-10 | Disposition: A | Discharge status: Home / Self Care | Payer: Medicare Other | Source: Ambulatory Visit | Attending: Ophthalmology | Admitting: Ophthalmology

## 2016-07-10 ENCOUNTER — Encounter (HOSPITAL_COMMUNITY): Payer: Self-pay

## 2016-07-14 ENCOUNTER — Ambulatory Visit (HOSPITAL_COMMUNITY): Admission: RE | Admit: 2016-07-14 | Payer: Medicare Other | Source: Ambulatory Visit | Admitting: Ophthalmology

## 2016-07-14 ENCOUNTER — Encounter (HOSPITAL_COMMUNITY): Admission: RE | Payer: Self-pay | Source: Ambulatory Visit

## 2016-07-14 SURGERY — PHACOEMULSIFICATION, CATARACT, WITH IOL INSERTION
Anesthesia: Monitor Anesthesia Care | Site: Eye | Laterality: Right

## 2016-07-14 NOTE — Discharge Instructions (Signed)

## 2016-07-18 ENCOUNTER — Encounter (HOSPITAL_COMMUNITY): Payer: Self-pay

## 2016-07-18 ENCOUNTER — Encounter (HOSPITAL_COMMUNITY)
Admission: RE | Admit: 2016-07-18 | Discharge: 2016-07-18 | Disposition: A | Discharge status: Home / Self Care | Payer: Medicare Other | Source: Ambulatory Visit | Attending: Ophthalmology | Admitting: Ophthalmology

## 2016-07-18 ENCOUNTER — Other Ambulatory Visit (HOSPITAL_COMMUNITY): Payer: Medicare Other

## 2016-07-24 ENCOUNTER — Encounter (HOSPITAL_COMMUNITY): Payer: Self-pay | Admitting: Ophthalmology

## 2016-07-24 ENCOUNTER — Encounter (HOSPITAL_COMMUNITY)
Admission: RE | Disposition: A | Discharge status: Home / Self Care | Payer: Self-pay | Source: Ambulatory Visit | Attending: Ophthalmology

## 2016-07-24 ENCOUNTER — Ambulatory Visit (HOSPITAL_COMMUNITY): Payer: Medicare Other | Admitting: Anesthesiology

## 2016-07-24 ENCOUNTER — Ambulatory Visit (HOSPITAL_COMMUNITY)
Admission: RE | Admit: 2016-07-24 | Discharge: 2016-07-24 | Disposition: A | Discharge status: Home / Self Care | Payer: Medicare Other | Source: Ambulatory Visit | Attending: Ophthalmology | Admitting: Ophthalmology

## 2016-07-24 DIAGNOSIS — H2511 Age-related nuclear cataract, right eye: Secondary | ICD-10-CM | POA: Insufficient documentation

## 2016-07-24 DIAGNOSIS — K219 Gastro-esophageal reflux disease without esophagitis: Secondary | ICD-10-CM | POA: Insufficient documentation

## 2016-07-24 DIAGNOSIS — F329 Major depressive disorder, single episode, unspecified: Secondary | ICD-10-CM | POA: Insufficient documentation

## 2016-07-24 DIAGNOSIS — Z87891 Personal history of nicotine dependence: Secondary | ICD-10-CM | POA: Diagnosis not present

## 2016-07-24 DIAGNOSIS — M1990 Unspecified osteoarthritis, unspecified site: Secondary | ICD-10-CM | POA: Insufficient documentation

## 2016-07-24 DIAGNOSIS — Z79899 Other long term (current) drug therapy: Secondary | ICD-10-CM | POA: Diagnosis not present

## 2016-07-24 HISTORY — PX: CATARACT EXTRACTION W/PHACO: SHX586

## 2016-07-24 SURGERY — PHACOEMULSIFICATION, CATARACT, WITH IOL INSERTION
Anesthesia: Monitor Anesthesia Care | Site: Eye | Laterality: Right

## 2016-07-24 MED ORDER — EPINEPHRINE PF 1 MG/ML IJ SOLN
INTRAOCULAR | Status: DC | PRN
  Administered 2016-07-24: 500 mL

## 2016-07-24 MED ORDER — MIDAZOLAM HCL 2 MG/2ML IJ SOLN
1.0000 mg | INTRAMUSCULAR | Status: AC
  Administered 2016-07-24 (×2): 1 mg via INTRAVENOUS
  Filled 2016-07-24: qty 2

## 2016-07-24 MED ORDER — PHENYLEPHRINE HCL 2.5 % OP SOLN
1.0000 [drp] | OPHTHALMIC | Status: AC
  Administered 2016-07-24 (×3): 1 [drp] via OPHTHALMIC

## 2016-07-24 MED ORDER — LIDOCAINE HCL 3.5 % OP GEL
1.0000 | Freq: Once | OPHTHALMIC | Status: AC
Start: 2016-07-24 — End: 2016-07-24
  Administered 2016-07-24: 1 via OPHTHALMIC

## 2016-07-24 MED ORDER — NEOMYCIN-POLYMYXIN-DEXAMETH 3.5-10000-0.1 OP SUSP
OPHTHALMIC | Status: DC | PRN
  Administered 2016-07-24: 2 [drp] via OPHTHALMIC

## 2016-07-24 MED ORDER — PROVISC 10 MG/ML IO SOLN
INTRAOCULAR | Status: DC | PRN
  Administered 2016-07-24: 0.85 mL via INTRAOCULAR

## 2016-07-24 MED ORDER — LIDOCAINE HCL (PF) 1 % IJ SOLN
INTRAMUSCULAR | Status: DC | PRN
  Administered 2016-07-24: .4 mL

## 2016-07-24 MED ORDER — CYCLOPENTOLATE-PHENYLEPHRINE 0.2-1 % OP SOLN
1.0000 [drp] | OPHTHALMIC | Status: AC
  Administered 2016-07-24 (×3): 1 [drp] via OPHTHALMIC

## 2016-07-24 MED ORDER — POVIDONE-IODINE 5 % OP SOLN
OPHTHALMIC | Status: DC | PRN
  Administered 2016-07-24: 1 via OPHTHALMIC

## 2016-07-24 MED ORDER — TETRACAINE HCL 0.5 % OP SOLN
1.0000 [drp] | OPHTHALMIC | Status: AC
  Administered 2016-07-24 (×3): 1 [drp] via OPHTHALMIC

## 2016-07-24 MED ORDER — BSS IO SOLN
INTRAOCULAR | Status: DC | PRN
  Administered 2016-07-24: 15 mL via INTRAOCULAR

## 2016-07-24 MED ORDER — FENTANYL CITRATE (PF) 100 MCG/2ML IJ SOLN
25.0000 ug | Freq: Once | INTRAMUSCULAR | Status: AC
  Administered 2016-07-24: 25 ug via INTRAVENOUS
  Filled 2016-07-24: qty 2

## 2016-07-24 MED ORDER — LACTATED RINGERS IV SOLN
INTRAVENOUS | Status: DC
  Administered 2016-07-24: 11:00:00 via INTRAVENOUS

## 2016-07-24 SURGICAL SUPPLY — 22 items
CAPSULAR TENSION RING-AMO (OPHTHALMIC RELATED) IMPLANT
CLOTH BEACON ORANGE TIMEOUT ST (SAFETY) IMPLANT
EYE SHIELD UNIVERSAL CLEAR (GAUZE/BANDAGES/DRESSINGS) ×3 IMPLANT
GLOVE BIOGEL PI IND STRL 6.5 (GLOVE) ×1 IMPLANT
GLOVE BIOGEL PI IND STRL 7.0 (GLOVE) ×1 IMPLANT
GLOVE BIOGEL PI INDICATOR 6.5 (GLOVE) ×2
GLOVE BIOGEL PI INDICATOR 7.0 (GLOVE) ×2
GLOVE EXAM NITRILE LRG STRL (GLOVE) IMPLANT
GLOVE EXAM NITRILE MD LF STRL (GLOVE) IMPLANT
KIT VITRECTOMY (OPHTHALMIC RELATED) ×3 IMPLANT
PAD ARMBOARD 7.5X6 YLW CONV (MISCELLANEOUS) ×3 IMPLANT
PROC W NO LENS (INTRAOCULAR LENS)
PROC W SPEC LENS (INTRAOCULAR LENS)
PROCESS W NO LENS (INTRAOCULAR LENS) IMPLANT
PROCESS W SPEC LENS (INTRAOCULAR LENS) IMPLANT
RETRACTOR IRIS SIGHTPATH (OPHTHALMIC RELATED) IMPLANT
RING MALYGIN (MISCELLANEOUS) IMPLANT
SIGHTPATH CAT PROC W REG LENS (Ophthalmic Related) ×3 IMPLANT
SYRINGE LUER LOK 1CC (MISCELLANEOUS) ×3 IMPLANT
TAPE PAPER 1X10 WHT MICROPORE (GAUZE/BANDAGES/DRESSINGS) ×3 IMPLANT
VISCOELASTIC ADDITIONAL (OPHTHALMIC RELATED) IMPLANT
WATER STERILE IRR 250ML POUR (IV SOLUTION) ×3 IMPLANT

## 2016-07-24 NOTE — Transfer of Care (Signed)
Immediate Anesthesia Transfer of Care Note  Patient: Dominique Cobb  Procedure(s) Performed: Procedure(s) with comments: CATARACT EXTRACTION PHACO AND INTRAOCULAR LENS PLACEMENT (IOC) (Right) - right cde 8.29  Patient Location: Short Stay  Anesthesia Type:MAC  Level of Consciousness: awake, alert  and patient cooperative  Airway & Oxygen Therapy: Patient Spontanous Breathing  Post-op Assessment: Report given to RN  Post vital signs: Reviewed and stable  Last Vitals:  Vitals:   07/24/16 1115 07/24/16 1120  BP:  134/69  Pulse:    Resp: 20 20  Temp:      Last Pain:  Vitals:   07/24/16 1059  TempSrc: Oral      Patients Stated Pain Goal: 5 (79/89/21 1941)  Complications: No apparent anesthesia complications

## 2016-07-24 NOTE — Anesthesia Postprocedure Evaluation (Signed)
Anesthesia Post Note  Patient: Dominique Cobb  Procedure(s) Performed: Procedure(s) (LRB): CATARACT EXTRACTION PHACO AND INTRAOCULAR LENS PLACEMENT (IOC) (Right)  Patient location during evaluation: Short Stay Anesthesia Type: MAC Level of consciousness: awake and alert, oriented and patient cooperative Pain management: pain level controlled Vital Signs Assessment: post-procedure vital signs reviewed and stable Respiratory status: spontaneous breathing, nonlabored ventilation and respiratory function stable Cardiovascular status: blood pressure returned to baseline Postop Assessment: no signs of nausea or vomiting Anesthetic complications: no     Last Vitals:  Vitals:   07/24/16 1115 07/24/16 1120  BP:  134/69  Pulse:    Resp: 20 20  Temp:      Last Pain:  Vitals:   07/24/16 1059  TempSrc: Oral                 Kalden Wanke J

## 2016-07-24 NOTE — H&P (Signed)
I have reviewed the H&P, the patient was re-examined, and I have identified no interval changes in medical condition and plan of care since the history and physical of record  

## 2016-07-24 NOTE — Discharge Instructions (Signed)

## 2016-07-24 NOTE — Op Note (Signed)
Date of Admission: 07/24/2016  Date of Surgery: 07/24/2016  Pre-Op Dx: Cataract Right  Eye  Post-Op Dx: Senile Nuclear Cataract  Right  Eye,  Dx Code H25.11  Surgeon: Tonny Branch, M.D.  Assistants: None  Anesthesia: Topical with MAC  Indications: Painless, progressive loss of vision with compromise of daily activities.  Surgery: Cataract Extraction with Intraocular lens Implant Right Eye  Discription: The patient had dilating drops and viscous lidocaine placed into the Right eye in the pre-op holding area. After transfer to the operating room, a time out was performed. The patient was then prepped and draped. Beginning with a 37 degree blade a paracentesis port was made at the surgeon's 2 o'clock position. The anterior chamber was then filled with 1% non-preserved lidocaine. This was followed by filling the anterior chamber with Provisc.  A 2.35m keratome blade was used to make a clear corneal incision at the temporal limbus.  A bent cystatome needle was used to create a continuous tear capsulotomy. Hydrodissection was performed with balanced salt solution on a Fine canula. The lens nucleus was then removed using the phacoemulsification handpiece. Residual cortex was removed with the I&A handpiece. The anterior chamber and capsular bag were refilled with Provisc. A posterior chamber intraocular lens was placed into the capsular bag with it's injector. The implant was positioned with the Kuglan hook. The Provisc was then removed from the anterior chamber and capsular bag with the I&A handpiece. Stromal hydration of the main incision and paracentesis port was performed with BSS on a Fine canula. The wounds were tested for leak which was negative. The patient tolerated the procedure well. There were no operative complications. The patient was then transferred to the recovery room in stable condition.  Complications: None  Specimen: None  EBL: None  Prosthetic device: Abbott Technis, PCB00, power  24.0, SN [id number]

## 2016-07-24 NOTE — Anesthesia Preprocedure Evaluation (Signed)
Anesthesia Evaluation  Patient identified by MRN, date of birth, ID band Patient awake    Reviewed: Allergy & Precautions, NPO status , Patient's Chart, lab work & pertinent test results  Airway Mallampati: I  TM Distance: >3 FB     Dental  (+) Teeth Intact   Pulmonary former smoker,    breath sounds clear to auscultation       Cardiovascular negative cardio ROS   Rhythm:Regular Rate:Normal     Neuro/Psych PSYCHIATRIC DISORDERS Depression    GI/Hepatic GERD  Medicated,  Endo/Other    Renal/GU      Musculoskeletal  (+) Arthritis ,   Abdominal   Peds  Hematology   Anesthesia Other Findings   Reproductive/Obstetrics                             Anesthesia Physical Anesthesia Plan  ASA: II  Anesthesia Plan: MAC   Post-op Pain Management:    Induction: Intravenous  Airway Management Planned: Nasal Cannula  Additional Equipment:   Intra-op Plan:   Post-operative Plan:   Informed Consent: I have reviewed the patients History and Physical, chart, labs and discussed the procedure including the risks, benefits and alternatives for the proposed anesthesia with the patient or authorized representative who has indicated his/her understanding and acceptance.     Plan Discussed with:   Anesthesia Plan Comments:         Anesthesia Quick Evaluation

## 2016-07-28 ENCOUNTER — Encounter (HOSPITAL_COMMUNITY): Payer: Self-pay | Admitting: Ophthalmology

## 2016-09-29 ENCOUNTER — Ambulatory Visit (INDEPENDENT_AMBULATORY_CARE_PROVIDER_SITE_OTHER): Payer: Medicare Other | Admitting: Otolaryngology

## 2016-09-29 DIAGNOSIS — H903 Sensorineural hearing loss, bilateral: Secondary | ICD-10-CM | POA: Diagnosis not present

## 2017-01-12 ENCOUNTER — Other Ambulatory Visit: Payer: Self-pay | Admitting: Internal Medicine

## 2017-01-12 DIAGNOSIS — Z1231 Encounter for screening mammogram for malignant neoplasm of breast: Secondary | ICD-10-CM

## 2017-01-14 ENCOUNTER — Ambulatory Visit
Admission: RE | Admit: 2017-01-14 | Discharge: 2017-01-14 | Disposition: A | Discharge status: Home / Self Care | Payer: Medicare Other | Source: Ambulatory Visit | Attending: Internal Medicine | Admitting: Internal Medicine

## 2017-01-14 DIAGNOSIS — Z1231 Encounter for screening mammogram for malignant neoplasm of breast: Secondary | ICD-10-CM

## 2017-07-30 ENCOUNTER — Ambulatory Visit
Admission: RE | Admit: 2017-07-30 | Discharge: 2017-07-30 | Disposition: A | Discharge status: Home / Self Care | Payer: Medicare Other | Source: Ambulatory Visit | Attending: Internal Medicine | Admitting: Internal Medicine

## 2017-07-30 ENCOUNTER — Other Ambulatory Visit: Payer: Self-pay | Admitting: Internal Medicine

## 2017-07-30 DIAGNOSIS — J209 Acute bronchitis, unspecified: Secondary | ICD-10-CM

## 2017-09-04 ENCOUNTER — Ambulatory Visit: Payer: Medicare Other | Admitting: Internal Medicine

## 2017-09-04 ENCOUNTER — Encounter: Payer: Self-pay | Admitting: Internal Medicine

## 2017-09-04 ENCOUNTER — Institutional Professional Consult (permissible substitution): Payer: Medicare Other | Admitting: Internal Medicine

## 2017-09-04 VITALS — BP 122/70 | HR 88 | Ht 64.0 in | Wt 141.0 lb

## 2017-09-04 DIAGNOSIS — R0609 Other forms of dyspnea: Secondary | ICD-10-CM | POA: Diagnosis not present

## 2017-09-04 DIAGNOSIS — R053 Chronic cough: Secondary | ICD-10-CM

## 2017-09-04 DIAGNOSIS — Z77120 Contact with and (suspected) exposure to mold (toxic): Secondary | ICD-10-CM | POA: Diagnosis not present

## 2017-09-04 DIAGNOSIS — R0602 Shortness of breath: Secondary | ICD-10-CM | POA: Diagnosis not present

## 2017-09-04 DIAGNOSIS — Z77123 Contact with and (suspected) exposure to radon and other naturally occuring radiation: Secondary | ICD-10-CM

## 2017-09-04 DIAGNOSIS — R05 Cough: Secondary | ICD-10-CM

## 2017-09-04 DIAGNOSIS — Z87891 Personal history of nicotine dependence: Secondary | ICD-10-CM | POA: Diagnosis not present

## 2017-09-04 DIAGNOSIS — Z7729 Contact with and (suspected ) exposure to other hazardous substances: Secondary | ICD-10-CM | POA: Diagnosis not present

## 2017-09-04 NOTE — Progress Notes (Signed)
Subjective:     Patient ID: Dominique Cobb, female   DOB: 05-04-1946, 72 y.o.   MRN: [id number]  PCP Josetta Huddle, MD  HPI  IOV 09/04/2017  Chief Complaint  Patient presents with  . Consult    Referred by Dr. Inda Merlin for cough.  Pt states she had an episode beginning 12/1 which lasted for two weeks and then in mid January 2019 had another episode with cough. Denies any current complaints of cough, or CP but has some mild SOB and has some mucus in chest/throat.  Pt is currently taking prednisone.   72 year old patient referred by Dr. Inda Merlin for evaluation of cough.  History is gained from talking to her and review of referral records.  Patient tells me that at baseline she never had any cough or shortness of breath but on deeper questioning she did admit to very mild dyspnea on exertion going on for some months or years.  But it was barely noticeable.  Then in early December 2000 and she did have an episode of bronchitis that resulted in cough lasting a few weeks.  This resolved.  Then in January 2019 she had another episode of bronchitis and the cough is still lingered as of this date.  She initially required a round of antibiotic and later prednisone which she says she did not take correctly.  Then in February another course of prednisone.  Currently on third course of prednisone for a few days and has several more days to go.  With this third course of prednisone she is beginning to feel better.  Cough was initially severe and associated with inability to lie flat and wheezing.  But with his third round of prednisone it is now resolved to mild and she is able to sleep in her bed shortness of breath is around the same there is no sputum although review of the records indicate that she did have some green sputum in the past.  She is not on any maintenance inhalers but has taken albuterol in the past with relief.  Exposure history: She lives in a 74 year old home and is lived in the same home for 30  years.  She has noted some mold in the windows.  In addition she uses a wood stove in the basement during the winter season which is been doing for many years.  Also has a history of radon exposure in the same home when she got it tested over 10 years ago.  She is also been a smoker 80 pack smoking history having quit within the last 15 years or less.  4 3 Dr Lorenza Cambridge Reflux Symptom Index (> 13-15 suggestive of LPR cough) 09/04/2017   Hoarseness of problem with voice 5  Clearing  Of Throat 5  Excess throat mucus or feeling of post nasal drip 5  Difficulty swallowing food, liquid or tablets 4  Cough after eating or lying down 4  Breathing difficulties or choking episodes 3  Troublesome or annoying cough 5  Sensation of something sticking in throat or lump in throat 5  Heartburn, chest pain, indigestion, or stomach acid coming up ?  TOTAL      Feno 09/04/2017 - 29 ppb     has a past medical history of Arthritis, Depression, Dysphagia, and GERD (gastroesophageal reflux disease).   reports that she quit smoking about 13 years ago. Her smoking use included cigarettes. She has a 80.00 pack-year smoking history. She has never used smokeless tobacco.  Past Surgical History:  Procedure Laterality Date  . BREAST CYST ASPIRATION Left   . BREAST EXCISIONAL BIOPSY Left   . BREAST SURGERY     benign.  25 yrs ago  . CARPAL TUNNEL RELEASE  1980's   bilateral   . CATARACT EXTRACTION W/PHACO Left 06/12/2016   Procedure: CATARACT EXTRACTION PHACO AND INTRAOCULAR LENS PLACEMENT LEFT EYE XHB7.16;  Surgeon: Tonny Branch, MD;  Location: [facility];  Service: Ophthalmology;  Laterality: Left;  left  . CATARACT EXTRACTION W/PHACO Right 07/24/2016   Procedure: CATARACT EXTRACTION PHACO AND INTRAOCULAR LENS PLACEMENT (IOC);  Surgeon: Tonny Branch, MD;  Location: [facility];  Service: Ophthalmology;  Laterality: Right;  right cde 8.29  . COLONOSCOPY N/A 10/29/2012   Procedure: COLONOSCOPY;  Surgeon: Rogene Houston, MD;   Location: [facility];  Service: Endoscopy;  Laterality: N/A;  140  . Dental implants    . FINGER SURGERY     trigger finger 3rd  bilaterally  . laproscopy     over 20 yrs ago  . NECK SURGERY     2007  . TONSILLECTOMY      Allergies  Allergen Reactions  . Codeine Other (See Comments)    Sweating and Patient just doesn't like.     Immunization History  Administered Date(s) Administered  . Influenza, High Dose Seasonal PF 03/02/2017    Family History  Problem Relation Age of Onset  . Arthritis Father   . Thyroid disease Father      Current Outpatient Medications:  .  BIOTIN PO, Take 1 tablet by mouth., Disp: , Rfl:  .  buPROPion (WELLBUTRIN XL) 300 MG 24 hr tablet, Take 300 mg by mouth daily.  , Disp: , Rfl:  .  escitalopram (LEXAPRO) 10 MG tablet, Take 5 mg by mouth daily. Takes 0.5 tablet, Disp: , Rfl:  .  Fexofenadine HCl (ALLEGRA ALLERGY PO), Take 1 tablet by mouth as needed (for itching)., Disp: , Rfl:  .  naproxen sodium (ALEVE) 220 MG tablet, Take 220 mg by mouth as needed., Disp: , Rfl:  .  OVER THE COUNTER MEDICATION, Apply 1 application topically daily as needed (dry skin). Water and glycerin topical solution mixed 2:1, Disp: , Rfl:  .  predniSONE (DELTASONE) 10 MG tablet, , Disp: , Rfl:  .  Propylene Glycol (SYSTANE BALANCE OP), Apply 2 drops to eye at bedtime as needed (dry eyes). , Disp: , Rfl:  .  TURMERIC PO, Take by mouth., Disp: , Rfl:  .  zolpidem (AMBIEN) 5 MG tablet, Take 2.5 mg by mouth at bedtime as needed for sleep. Takes 0.5 tablet, Disp: , Rfl:     Review of Systems     Objective:   Physical Exam  Constitutional: She is oriented to person, place, and time. She appears well-developed and well-nourished. No distress.  HENT:  Head: Normocephalic and atraumatic.  Right Ear: External ear normal.  Left Ear: External ear normal.  Mouth/Throat: Oropharynx is clear and moist. No oropharyngeal exudate.  Mild post nasal drip  Eyes: Pupils are  equal, round, and reactive to light. Conjunctivae and EOM are normal. Right eye exhibits no discharge. Left eye exhibits no discharge. No scleral icterus.  Neck: Normal range of motion. Neck supple. No JVD present. No tracheal deviation present. No thyromegaly present.  Cardiovascular: Normal rate, regular rhythm, normal heart sounds and intact distal pulses. Exam reveals no gallop and no friction rub.  No murmur heard. Pulmonary/Chest: Effort normal and breath sounds normal. No respiratory distress. She has no  wheezes. She has no rales. She exhibits no tenderness.  Abdominal: Soft. Bowel sounds are normal. She exhibits no distension and no mass. There is no tenderness. There is no rebound and no guarding.  Musculoskeletal: Normal range of motion. She exhibits no edema or tenderness.  Lymphadenopathy:    She has no cervical adenopathy.  Neurological: She is alert and oriented to person, place, and time. She has normal reflexes. No cranial nerve deficit. She exhibits normal muscle tone. Coordination normal.  Skin: Skin is warm and dry. No rash noted. She is not diaphoretic. No erythema. No pallor.  Psychiatric: She has a normal mood and affect. Her behavior is normal. Judgment and thought content normal.  Vitals reviewed.  Vitals:   09/04/17 1110  BP: 122/70  Pulse: 88  SpO2: 94%  Weight: 141 lb (64 kg)  Height: 5\' 4"  (1.626 m)    Estimated body mass index is 24.2 kg/m as calculated from the following:   Height as of this encounter: 5\' 4"  (1.626 m).   Weight as of this encounter: 141 lb (64 kg).     Assessment:       ICD-10-CM   1. Chronic cough R05   2. Dyspnea on exertion R06.09   3. Stopped smoking with greater than 40 pack year history Z87.891   4. Mold exposure Z77.120   5. Exposure to smoke from wood stove Z77.29   6. Contact with and (suspected) exposure to radon and other naturally occurring radiation Z77.123        Plan:       Glad you are better with time +  prednisone Due to above exposures need to ensure your lung health is free of cancer, fibrosis or copd  Plan Finish prednisone given by PCP Josetta Huddle, MD Do HRCT supine and prone - any time next few weeks Do full PFT - anytime next few weeks  - can do tests at [facility] due to residence in [place] but ensure charges are acceptable  Followup  few weeks with DR Chase Caller or an APP to discuss results      Dr. Brand Males, M.D., [facility].C.P Pulmonary and Critical Care Medicine Staff Physician, [place] Director - Interstitial Lung Disease  Program  Pulmonary [place] at [place], [place], [address]  Pager: [phone], If no answer or between  15:00h - 7:00h: call [phone] Telephone: [phone]

## 2017-09-04 NOTE — Patient Instructions (Signed)
ICD-10-CM   1. Chronic cough R05   2. Dyspnea on exertion R06.09   3. Stopped smoking with greater than 40 pack year history Z87.891   4. Mold exposure Z77.120   5. Exposure to smoke from wood stove Z77.29   6. Contact with and (suspected) exposure to radon and other naturally occurring radiation Z77.123     Glad you are better with time + prednisone Due to above exposures need to ensure your lung health is free of cancer, fibrosis or copd  Plan Finish prednisone given by PCP Josetta Huddle, MD Do HRCT supine and prone - any time next few weeks Do full PFT - anytime next few weeks  - can do tests at [facility] due to residence in [place] but ensure charges are acceptable  Followup  few weeks with DR Chase Caller or an APP to discuss results

## 2017-09-04 NOTE — Progress Notes (Signed)
   Subjective:    Patient ID: Dominique Cobb, female    DOB: 1946/05/15, 72 y.o.   MRN: [id number]  HPI    Review of Systems  Constitutional: Negative for fever and unexpected weight change.  HENT: Positive for congestion, postnasal drip, sinus pressure and sneezing. Negative for dental problem, ear pain, nosebleeds, rhinorrhea, sore throat and trouble swallowing.   Eyes: Negative for redness and itching.  Respiratory: Positive for cough, chest tightness, shortness of breath and wheezing.   Cardiovascular: Negative for palpitations and leg swelling.  Gastrointestinal: Negative for nausea and vomiting.  Genitourinary: Negative for dysuria.  Musculoskeletal: Negative for joint swelling.  Skin: Negative for rash.  Allergic/Immunologic: Negative.  Negative for environmental allergies, food allergies and immunocompromised state.  Neurological: Negative for headaches.  Hematological: Does not bruise/bleed easily.  Psychiatric/Behavioral: Positive for dysphoric mood. The patient is not nervous/anxious.        Objective:   Physical Exam        Assessment & Plan:

## 2017-09-10 ENCOUNTER — Encounter (HOSPITAL_COMMUNITY): Payer: Medicare Other

## 2017-09-11 ENCOUNTER — Ambulatory Visit (INDEPENDENT_AMBULATORY_CARE_PROVIDER_SITE_OTHER): Payer: Medicare Other | Admitting: Internal Medicine

## 2017-09-11 DIAGNOSIS — R053 Chronic cough: Secondary | ICD-10-CM

## 2017-09-11 DIAGNOSIS — R05 Cough: Secondary | ICD-10-CM

## 2017-09-11 LAB — PULMONARY FUNCTION TEST
DL/VA % pred: 90 %
DL/VA: 4.09 ml/min/mmHg/L
DLCO unc % pred: 88 %
DLCO unc: 19.1 ml/min/mmHg
FEF 25-75 PRE: 1.56 L/s
FEF 25-75 Post: 1.72 L/sec
FEF2575-%Change-Post: 10 %
FEF2575-%PRED-PRE: 97 %
FEF2575-%Pred-Post: 107 %
FEV1-%Change-Post: 2 %
FEV1-%PRED-POST: 109 %
FEV1-%PRED-PRE: 107 %
FEV1-POST: 2.14 L
FEV1-PRE: 2.09 L
FEV1FVC-%Change-Post: 3 %
FEV1FVC-%Pred-Pre: 99 %
FEV6-%CHANGE-POST: 0 %
FEV6-%PRED-PRE: 112 %
FEV6-%Pred-Post: 111 %
FEV6-POST: 2.78 L
FEV6-Pre: 2.78 L
FEV6FVC-%Change-Post: 0 %
FEV6FVC-%PRED-POST: 105 %
FEV6FVC-%Pred-Pre: 104 %
FVC-%Change-Post: -1 %
FVC-%PRED-PRE: 107 %
FVC-%Pred-Post: 106 %
FVC-POST: 2.78 L
FVC-Pre: 2.81 L
PRE FEV1/FVC RATIO: 74 %
Post FEV1/FVC ratio: 77 %
Post FEV6/FVC ratio: 100 %
Pre FEV6/FVC Ratio: 99 %
RV % pred: 85 %
RV: 1.86 L
TLC % pred: 107 %
TLC: 5.09 L

## 2017-09-11 NOTE — Progress Notes (Signed)
Patient completed full PFT today. 

## 2017-10-05 ENCOUNTER — Ambulatory Visit (INDEPENDENT_AMBULATORY_CARE_PROVIDER_SITE_OTHER): Payer: Medicare Other | Admitting: Otolaryngology

## 2017-10-05 DIAGNOSIS — H903 Sensorineural hearing loss, bilateral: Secondary | ICD-10-CM | POA: Diagnosis not present

## 2017-10-06 ENCOUNTER — Ambulatory Visit (HOSPITAL_COMMUNITY)
Admission: RE | Admit: 2017-10-06 | Discharge: 2017-10-06 | Disposition: A | Discharge status: Home / Self Care | Payer: Medicare Other | Source: Ambulatory Visit | Attending: Internal Medicine | Admitting: Internal Medicine

## 2017-10-06 DIAGNOSIS — I2510 Atherosclerotic heart disease of native coronary artery without angina pectoris: Secondary | ICD-10-CM | POA: Diagnosis not present

## 2017-10-06 DIAGNOSIS — I700 Atherosclerosis of aorta: Secondary | ICD-10-CM | POA: Diagnosis not present

## 2017-10-06 DIAGNOSIS — J432 Centrilobular emphysema: Secondary | ICD-10-CM | POA: Insufficient documentation

## 2017-10-06 DIAGNOSIS — R0602 Shortness of breath: Secondary | ICD-10-CM | POA: Insufficient documentation

## 2017-10-06 DIAGNOSIS — J438 Other emphysema: Secondary | ICD-10-CM | POA: Insufficient documentation

## 2017-10-16 ENCOUNTER — Encounter: Payer: Self-pay | Admitting: Internal Medicine

## 2017-10-16 ENCOUNTER — Ambulatory Visit: Payer: Medicare Other | Admitting: Internal Medicine

## 2017-10-16 VITALS — BP 132/82 | HR 72 | Ht 64.0 in | Wt 139.6 lb

## 2017-10-16 DIAGNOSIS — J45901 Unspecified asthma with (acute) exacerbation: Secondary | ICD-10-CM

## 2017-10-16 DIAGNOSIS — I2510 Atherosclerotic heart disease of native coronary artery without angina pectoris: Secondary | ICD-10-CM | POA: Diagnosis not present

## 2017-10-16 LAB — NITRIC OXIDE: Nitric Oxide: 59

## 2017-10-16 MED ORDER — ALBUTEROL SULFATE HFA 108 (90 BASE) MCG/ACT IN AERS: 2.0000 | INHALATION_SPRAY | Freq: Four times a day (QID) | RESPIRATORY_TRACT | 6 refills | Status: DC | PRN

## 2017-10-16 MED ORDER — BUDESONIDE-FORMOTEROL FUMARATE 80-4.5 MCG/ACT IN AERO: 2.0000 | INHALATION_SPRAY | Freq: Two times a day (BID) | RESPIRATORY_TRACT | 12 refills | Status: DC

## 2017-10-16 MED ORDER — PREDNISONE 10 MG PO TABS: ORAL_TABLET | ORAL | 0 refills | Status: DC

## 2017-10-16 NOTE — Patient Instructions (Addendum)
ICD-10-CM   1. Asthma with acute exacerbation, unspecified asthma severity, unspecified whether persistent J45.901   2. Coronary artery calcification seen on CAT scan I25.10     Coronary artery calcification seen on CAT scan does have coronary artery calcification and if no normal cardiac stress test past few years; please refer to cardiologist - [facility] or Dr Einar Gip, first available   Acute Asthma You have asthma based on history and feno testing - new diagnosis There is a mild element of emphysema from prior smoking but this is not primary problems  Take prednisone 40 mg daily x 2 days, then 20mg  daily x 2 days, then 10mg  daily x 2 days, then 5mg  daily x 2 days and stop Start symbicort 80/4.5 2 puff bid Start albuterol as needed   4 mm lung nodule in the setting of 80 pack smoking history -We will order at the time of next follow-up repeat CT chest without contrast for May or June 2020  Followup 4-8 weeks with APP or Dr Chase Caller tio report progress

## 2017-10-16 NOTE — Addendum Note (Signed)
Addended by: Lorretta Harp on: 10/16/2017 12:43 PM   Modules accepted: Orders

## 2017-10-16 NOTE — Progress Notes (Signed)
Subjective:     Patient ID: Dominique Cobb, female   DOB: 13-Sep-1945, 72 y.o.   MRN: [id number]  HPI  IOV 09/04/2017  Chief Complaint  Patient presents with  . Consult    Referred by Dr. Inda Merlin for cough.  Pt states she had an episode beginning 12/1 which lasted for two weeks and then in mid January 2019 had another episode with cough. Denies any current complaints of cough, or CP but has some mild SOB and has some mucus in chest/throat.  Pt is currently taking prednisone.   72 year old patient referred by Dr. Inda Merlin for evaluation of cough.  History is gained from talking to her and review of referral records.  Patient tells me that at baseline she never had any cough or shortness of breath but on deeper questioning she did admit to very mild dyspnea on exertion going on for some months or years.  But it was barely noticeable.  Then in early December 2000 and she did have an episode of bronchitis that resulted in cough lasting a few weeks.  This resolved.  Then in January 2019 she had another episode of bronchitis and the cough is still lingered as of this date.  She initially required a round of antibiotic and later prednisone which she says she did not take correctly.  Then in February another course of prednisone.  Currently on third course of prednisone for a few days and has several more days to go.  With this third course of prednisone she is beginning to feel better.  Cough was initially severe and associated with inability to lie flat and wheezing.  But with his third round of prednisone it is now resolved to mild and she is able to sleep in her bed shortness of breath is around the same there is no sputum although review of the records indicate that she did have some green sputum in the past.  She is not on any maintenance inhalers but has taken albuterol in the past with relief.  Exposure history: She lives in a 74 year old home and is lived in the same home for 30 years.  She has noted some  mold in the windows.  In addition she uses a wood stove in the basement during the winter season which is been doing for many years.  Also has a history of radon exposure in the same home when she got it tested over 10 years ago.  She is also been a smoker 80 pack smoking history having quit within the last 15 years or less.     OV 10/16/2017  Chief Complaint  Patient presents with  . Follow-up    PFT done 4/12 and HRCT done 5/7.  Pt states when she was on prednisone, the cough and wheezing stopped. Around Easter, pt's cough came back bad. Pt states it is hard for her to cough mucus up but has it in her chest.    Follow-up chronic cough in the setting of radon exposure, prior 80 pack smoking history and more recent mold exposure.  At last visit when I saw her for the first time in April 2019 exam nitric oxide was slightly in the gray zone and in the setting of prednisone her cough is getting better.  She returns now after finishing the prednisone and completing work-up with high-resolution CT chest and pulmonary function test.  She had her pulmonary function test September 11, 2017 prior to Easter and this is normal and I personally visualized the  image.  She did have high-resolution CT chest in May 2019 and there is no interstitial lung disease oror cancer.  She has tiny 4 mm lung nodule and some really mild emphysema.  However she tells me that after she finished her prednisone around Easter 2019 her cough returned with a vengeance and she has had significant wheezing.  In fact her cough symptom score documented below shows severe amount of symptoms.  She has nocturnal cough.  So we retested exam nitric oxide today and is significantly elevated at 59 ppb   Dr Lorenza Cambridge Reflux Symptom Index (> 13-15 suggestive of LPR cough) 09/04/2017  10/16/2017   Hoarseness of problem with voice 5 1  Clearing  Of Throat 5 2  Excess throat mucus or feeling of post nasal drip 5 4  Difficulty swallowing food, liquid  or tablets 4 4  Cough after eating or lying down 4 3  Breathing difficulties or choking episodes 3 4  Troublesome or annoying cough 5 5  Sensation of something sticking in throat or lump in throat 5 4  Heartburn, chest pain, indigestion, or stomach acid coming up ? 4  TOTAL  31     Feno 09/04/2017 - 29 ppb (on prednsione) ->  Repeat FeNO 10/16/2017 ->  59    IMPRESSION: - personally visualized image and agree with findings 1. No definitive evidence to suggest interstitial lung disease. There are some areas of parenchymal banding in the lung bases bilaterally which are strongly favored to reflect areas of chronic post infectious or inflammatory scarring. 2. Mild diffuse bronchial thickening with mild centrilobular and paraseptal emphysema; imaging findings suggestive of underlying COPD. 3. Multiple tiny 2-4 mm pulmonary nodules scattered throughout both lungs, nonspecific, but statistically likely benign. No follow-up needed if patient is low-risk (and has no known or suspected primary neoplasm). Non-contrast chest CT can be considered in 12 months if patient is high-risk. This recommendation follows the consensus statement: Guidelines for Management of Incidental Pulmonary Nodules Detected on CT Images: From the Fleischner Society 2017; Radiology 2017; 284:228-243. 4. Aortic atherosclerosis, in addition to 2 vessel coronary artery disease. Assessment for potential risk factor modification, dietary therapy or pharmacologic therapy may be warranted, if clinically indicated.  Aortic Atherosclerosis (ICD10-I70.0) and Emphysema (ICD10-J43.9).   Electronically Signed   By: Vinnie Langton M.D.   On: 10/06/2017 16:30     has a past medical history of Arthritis, Depression, Dysphagia, and GERD (gastroesophageal reflux disease).   reports that she quit smoking about 13 years ago. Her smoking use included cigarettes. She has a 80.00 pack-year smoking history. She has never used  smokeless tobacco.  Past Surgical History:  Procedure Laterality Date  . BREAST CYST ASPIRATION Left   . BREAST EXCISIONAL BIOPSY Left   . BREAST SURGERY     benign.  25 yrs ago  . CARPAL TUNNEL RELEASE  1980's   bilateral   . CATARACT EXTRACTION W/PHACO Left 06/12/2016   Procedure: CATARACT EXTRACTION PHACO AND INTRAOCULAR LENS PLACEMENT LEFT EYE HWE9.93;  Surgeon: Tonny Branch, MD;  Location: [facility];  Service: Ophthalmology;  Laterality: Left;  left  . CATARACT EXTRACTION W/PHACO Right 07/24/2016   Procedure: CATARACT EXTRACTION PHACO AND INTRAOCULAR LENS PLACEMENT (IOC);  Surgeon: Tonny Branch, MD;  Location: [facility];  Service: Ophthalmology;  Laterality: Right;  right cde 8.29  . COLONOSCOPY N/A 10/29/2012   Procedure: COLONOSCOPY;  Surgeon: Rogene Houston, MD;  Location: [facility];  Service: Endoscopy;  Laterality: N/A;  140  .  Dental implants    . FINGER SURGERY     trigger finger 3rd  bilaterally  . laproscopy     over 20 yrs ago  . NECK SURGERY     2007  . TONSILLECTOMY      Allergies  Allergen Reactions  . Codeine Other (See Comments)    Sweating and Patient just doesn't like.     Immunization History  Administered Date(s) Administered  . Influenza, High Dose Seasonal PF 03/02/2017    Family History  Problem Relation Age of Onset  . Arthritis Father   . Thyroid disease Father      Current Outpatient Medications:  .  BIOTIN PO, Take 1 tablet by mouth., Disp: , Rfl:  .  buPROPion (WELLBUTRIN XL) 300 MG 24 hr tablet, Take 300 mg by mouth daily.  , Disp: , Rfl:  .  escitalopram (LEXAPRO) 10 MG tablet, Take 5 mg by mouth daily. Takes 0.5 tablet, Disp: , Rfl:  .  Fexofenadine HCl (ALLEGRA ALLERGY PO), Take 1 tablet by mouth as needed (for itching)., Disp: , Rfl:  .  naproxen sodium (ALEVE) 220 MG tablet, Take 220 mg by mouth as needed., Disp: , Rfl:  .  OVER THE COUNTER MEDICATION, Apply 1 application topically daily as needed (dry skin). Water and glycerin topical  solution mixed 2:1, Disp: , Rfl:  .  Propylene Glycol (SYSTANE BALANCE OP), Apply 2 drops to eye at bedtime as needed (dry eyes). , Disp: , Rfl:  .  TURMERIC PO, Take by mouth., Disp: , Rfl:  .  zolpidem (AMBIEN) 5 MG tablet, Take 2.5 mg by mouth at bedtime as needed for sleep. Takes 0.5 tablet, Disp: , Rfl:    Review of Systems     Objective:   Physical Exam  Constitutional: She is oriented to person, place, and time. She appears well-developed and well-nourished. No distress.  HENT:  Head: Normocephalic and atraumatic.  Right Ear: External ear normal.  Left Ear: External ear normal.  Mouth/Throat: Oropharynx is clear and moist. No oropharyngeal exudate.  Eyes: Pupils are equal, round, and reactive to light. Conjunctivae and EOM are normal. Right eye exhibits no discharge. Left eye exhibits no discharge. No scleral icterus.  Neck: Normal range of motion. Neck supple. No JVD present. No tracheal deviation present. No thyromegaly present.  Cardiovascular: Normal rate, regular rhythm, normal heart sounds and intact distal pulses. Exam reveals no gallop and no friction rub.  No murmur heard. Pulmonary/Chest: Effort normal. No respiratory distress. She has no rales. She exhibits no tenderness.  Diffuse wheezign bilaterally +  Abdominal: Soft. Bowel sounds are normal. She exhibits no distension and no mass. There is no tenderness. There is no rebound and no guarding.  Musculoskeletal: Normal range of motion. She exhibits no edema or tenderness.  Lymphadenopathy:    She has no cervical adenopathy.  Neurological: She is alert and oriented to person, place, and time. She has normal reflexes. No cranial nerve deficit. She exhibits normal muscle tone. Coordination normal.  Skin: Skin is warm and dry. No rash noted. She is not diaphoretic. No erythema. No pallor.  Psychiatric: She has a normal mood and affect. Her behavior is normal. Judgment and thought content normal.  Vitals  reviewed.  Vitals:   10/16/17 1216  BP: 132/82  Pulse: 72  SpO2: 96%  Weight: 139 lb 9.6 oz (63.3 kg)  Height: 5\' 4"  (1.626 m)    Estimated body mass index is 23.96 kg/m as calculated from the following:  Height as of this encounter: 5\' 4"  (1.626 m).   Weight as of this encounter: 139 lb 9.6 oz (63.3 kg).     Assessment:       ICD-10-CM   1. Asthma with acute exacerbation, unspecified asthma severity, unspecified whether persistent J45.901   2. Coronary artery calcification seen on CAT scan I25.10 Ambulatory referral to Cardiology       Plan:      Coronary artery calcification seen on CAT scan does have coronary artery calcification and if no normal cardiac stress test past few years; please refer to cardiologist - [facility] or Dr Einar Gip, first available   Acute Asthma You have asthma based on history and feno testing - new diagnosis There is a mild element of emphysema from prior smoking but this is not primary problems  Take prednisone 40 mg daily x 2 days, then 20mg  daily x 2 days, then 10mg  daily x 2 days, then 5mg  daily x 2 days and stop Start symbicort 80/4.5 2 puff bid Start albuterol as needed   4 mm lung nodule in the setting of 80 pack smoking history -We will order at the time of next follow-up repeat CT chest without contrast for May or June 2020  Followup 4-8 weeks with APP or Dr Chase Caller tio report progress       Dr. Brand Males, M.D., [facility].C.P Pulmonary and Critical Care Medicine Staff Physician, [place] Director - Interstitial Lung Disease  Program  Pulmonary [place] at [place], [place], [address]  Pager: [phone], If no answer or between  15:00h - 7:00h: call [phone] Telephone: [phone]

## 2017-11-18 ENCOUNTER — Ambulatory Visit: Payer: Medicare Other | Admitting: Internal Medicine

## 2017-11-18 ENCOUNTER — Encounter: Payer: Self-pay | Admitting: Internal Medicine

## 2017-11-18 VITALS — BP 122/78 | HR 85 | Ht 64.0 in | Wt 138.8 lb

## 2017-11-18 DIAGNOSIS — J019 Acute sinusitis, unspecified: Secondary | ICD-10-CM

## 2017-11-18 DIAGNOSIS — J45909 Unspecified asthma, uncomplicated: Secondary | ICD-10-CM

## 2017-11-18 DIAGNOSIS — R911 Solitary pulmonary nodule: Secondary | ICD-10-CM | POA: Diagnosis not present

## 2017-11-18 DIAGNOSIS — IMO0001 Reserved for inherently not codable concepts without codable children: Secondary | ICD-10-CM

## 2017-11-18 LAB — POCT EXHALED NITRIC OXIDE: FeNO level (ppb): 7

## 2017-11-18 MED ORDER — DOXYCYCLINE HYCLATE 100 MG PO TABS: 100.0000 mg | ORAL_TABLET | Freq: Two times a day (BID) | ORAL | 0 refills | Status: DC

## 2017-11-18 MED ORDER — PREDNISONE 10 MG PO TABS: ORAL_TABLET | ORAL | 0 refills | Status: DC

## 2017-11-18 NOTE — Patient Instructions (Addendum)
ICD-10-CM   1. Acute sinusitis, recurrence not specified, unspecified location J01.90   2. Moderate asthma without complication, unspecified whether persistent J45.909   3. Lung nodule < 6cm on CT R91.1     Acute Sinusitis  - change doxy to Take doxycycline 100mg  po twice daily x 5 days; take after meals and avoid sunlight - take Please take prednisone 40 mg x1 day, then 30 mg x1 day, then 20 mg x1 day, then 10 mg x1 day, and then 5 mg x1 day and stop    Moderate persistent asthma with mild radiologic emphysema Continue  symbicort 80/4.5 2 puff twice daily conitnue albuterol as needed   4 mm lung nodule in the setting of 80 pack smoking history  do followup CT chest without contrast for May or June 2020  Followup 8-12 weeks with Dr Guadalupe Dawn or APP  - check feno at followup and coug score - de-esclate symbicort to inhaled steroid alone if doing well

## 2017-11-18 NOTE — Addendum Note (Signed)
Addended by: Madolyn Frieze on: 11/18/2017 03:25 PM   Modules accepted: Orders

## 2017-11-18 NOTE — Progress Notes (Signed)
Subjective:    Patient ID: Dominique Cobb, female    DOB: 1946/01/15, 72 y.o.   MRN: [id number]  HPI  IOV 09/04/2017  Chief Complaint  Patient presents with  . Consult    Referred by Dr. Inda Merlin for cough.  Pt states she had an episode beginning 12/1 which lasted for two weeks and then in mid January 2019 had another episode with cough. Denies any current complaints of cough, or CP but has some mild SOB and has some mucus in chest/throat.  Pt is currently taking prednisone.   72 year old patient referred by Dr. Inda Merlin for evaluation of cough.  History is gained from talking to her and review of referral records.  Patient tells me that at baseline she never had any cough or shortness of breath but on deeper questioning she did admit to very mild dyspnea on exertion going on for some months or years.  But it was barely noticeable.  Then in early December 2000 and she did have an episode of bronchitis that resulted in cough lasting a few weeks.  This resolved.  Then in January 2019 she had another episode of bronchitis and the cough is still lingered as of this date.  She initially required a round of antibiotic and later prednisone which she says she did not take correctly.  Then in February another course of prednisone.  Currently on third course of prednisone for a few days and has several more days to go.  With this third course of prednisone she is beginning to feel better.  Cough was initially severe and associated with inability to lie flat and wheezing.  But with his third round of prednisone it is now resolved to mild and she is able to sleep in her bed shortness of breath is around the same there is no sputum although review of the records indicate that she did have some green sputum in the past.  She is not on any maintenance inhalers but has taken albuterol in the past with relief.  Exposure history: She lives in a 74 year old home and is lived in the same home for 30 years.  She has noted some  mold in the windows.  In addition she uses a wood stove in the basement during the winter season which is been doing for many years.  Also has a history of radon exposure in the same home when she got it tested over 10 years ago.  She is also been a smoker 80 pack smoking history having quit within the last 15 years or less.     OV 10/16/2017  Chief Complaint  Patient presents with  . Follow-up    PFT done 4/12 and HRCT done 5/7.  Pt states when she was on prednisone, the cough and wheezing stopped. Around Easter, pt's cough came back bad. Pt states it is hard for her to cough mucus up but has it in her chest.    Follow-up chronic cough in the setting of radon exposure, prior 80 pack smoking history and more recent mold exposure.  At last visit when I saw her for the first time in April 2019 exam nitric oxide was slightly in the gray zone and in the setting of prednisone her cough is getting better.  She returns now after finishing the prednisone and completing work-up with high-resolution CT chest and pulmonary function test.  She had her pulmonary function test September 11, 2017 prior to Easter and this is normal and I personally visualized  the image.  She did have high-resolution CT chest in May 2019 and there is no interstitial lung disease oror cancer.  She has tiny 4 mm lung nodule and some really mild emphysema.  However she tells me that after she finished her prednisone around Easter 2019 her cough returned with a vengeance and she has had significant wheezing.  In fact her cough symptom score documented below shows severe amount of symptoms.  She has nocturnal cough.  So we retested exam nitric oxide today and is significantly elevated at 59 ppb   IMPRESSION: - personally visualized image and agree with findings 1. No definitive evidence to suggest interstitial lung disease. There are some areas of parenchymal banding in the lung bases bilaterally which are strongly favored to reflect  areas of chronic post infectious or inflammatory scarring. 2. Mild diffuse bronchial thickening with mild centrilobular and paraseptal emphysema; imaging findings suggestive of underlying COPD. 3. Multiple tiny 2-4 mm pulmonary nodules scattered throughout both lungs, nonspecific, but statistically likely benign. No follow-up needed if patient is low-risk (and has no known or suspected primary neoplasm). Non-contrast chest CT can be considered in 12 months if patient is high-risk. This recommendation follows the consensus statement: Guidelines for Management of Incidental Pulmonary Nodules Detected on CT Images: From the Fleischner Society 2017; Radiology 2017; 284:228-243. 4. Aortic atherosclerosis, in addition to 2 vessel coronary artery disease. Assessment for potential risk factor modification, dietary therapy or pharmacologic therapy may be warranted, if clinically indicated.  Aortic Atherosclerosis (ICD10-I70.0) and Emphysema (ICD10-J43.9).   Electronically Signed   By: Vinnie Langton M.D.   On: 10/06/2017 16:30     OV 11/18/2017  Chief Complaint  Patient presents with  . Follow-up    6/15 Sinus started to drain yellow in color. Having chest congestion, wheezing coughing productive yellow mucus. She had some left over doxycline and has taken 50mg  qd for 8 days now.   Dominique Cobb , 72 y.o. , with dob 1946/05/25 and female ,Not Hispanic or Latino from [address] [place] [address] - presents to lung clinic for chronic cough - cough variant asthma/midl radiologic emphsyema + lung nodule 73mm with prior 80ppd smokhng hx  -Last visit I started her on Symbicort and given prednisone. After this the cough resolved essentially and the wheezing completely resolved. This happened to the point that she actually stopped taking her Symbicort and then she she developed acute sinus infection with yellow sinus drainage and change in voiceand ear blockage. She had some leftover  doxycycline 50 mg tablets given earlier sometime back by her primary care physician assistant. She started taking this at 50 mg once a day and is only some better. She now feels the cough is descended into her lungs and is having some wheezing. Exhaled nitric oxide today is normal but she feels she'll benefit from prednisone. RSI cough score deteriorated and is documented below.       Dr Lorenza Cambridge Reflux Symptom Index (> 13-15 suggestive of LPR cough) 09/04/2017 feno 0 29 oon pred 10/16/2017 feno - 59ppb 11/18/2017 feno 7ppbm  Hoarseness of problem with voice 5 1 2   Clearing  Of Throat 5 2 2   Excess throat mucus or feeling of post nasal drip 5 4 5   Difficulty swallowing food, liquid or tablets 4 4 5   Cough after eating or lying down 4 3 5   Breathing difficulties or choking episodes 3 4 3   Troublesome or annoying cough 5 5 5   Sensation of something sticking  in throat or lump in throat 5 4 0  Heartburn, chest pain, indigestion, or stomach acid coming up ? 4 0  TOTAL  31 27     Feno 09/04/2017 - 29 ppb (on prednsione) ->  Repeat FeNO 10/16/2017 ->  59      has a past medical history of Arthritis, Depression, Dysphagia, and GERD (gastroesophageal reflux disease).   reports that she quit smoking about 13 years ago. Her smoking use included cigarettes. She has a 80.00 pack-year smoking history. She has never used smokeless tobacco.  Past Surgical History:  Procedure Laterality Date  . BREAST CYST ASPIRATION Left   . BREAST EXCISIONAL BIOPSY Left   . BREAST SURGERY     benign.  25 yrs ago  . CARPAL TUNNEL RELEASE  1980's   bilateral   . CATARACT EXTRACTION W/PHACO Left 06/12/2016   Procedure: CATARACT EXTRACTION PHACO AND INTRAOCULAR LENS PLACEMENT LEFT EYE XHB7.16;  Surgeon: Tonny Branch, MD;  Location: [facility];  Service: Ophthalmology;  Laterality: Left;  left  . CATARACT EXTRACTION W/PHACO Right 07/24/2016   Procedure: CATARACT EXTRACTION PHACO AND INTRAOCULAR LENS PLACEMENT (IOC);   Surgeon: Tonny Branch, MD;  Location: [facility];  Service: Ophthalmology;  Laterality: Right;  right cde 8.29  . COLONOSCOPY N/A 10/29/2012   Procedure: COLONOSCOPY;  Surgeon: Rogene Houston, MD;  Location: [facility];  Service: Endoscopy;  Laterality: N/A;  140  . Dental implants    . FINGER SURGERY     trigger finger 3rd  bilaterally  . laproscopy     over 20 yrs ago  . NECK SURGERY     2007  . TONSILLECTOMY      Allergies  Allergen Reactions  . Codeine Other (See Comments)    Sweating and Patient just doesn't like.     Immunization History  Administered Date(s) Administered  . Influenza, High Dose Seasonal PF 03/02/2017    Family History  Problem Relation Age of Onset  . Arthritis Father   . Thyroid disease Father      Current Outpatient Medications:  .  albuterol (PROVENTIL HFA;VENTOLIN HFA) 108 (90 Base) MCG/ACT inhaler, Inhale 2 puffs into the lungs every 6 (six) hours as needed for wheezing or shortness of breath., Disp: 1 Inhaler, Rfl: 6 .  BIOTIN PO, Take 1 tablet by mouth., Disp: , Rfl:  .  budesonide-formoterol (SYMBICORT) 80-4.5 MCG/ACT inhaler, Inhale 2 puffs into the lungs 2 (two) times daily., Disp: 1 Inhaler, Rfl: 12 .  buPROPion (WELLBUTRIN XL) 300 MG 24 hr tablet, Take 300 mg by mouth daily.  , Disp: , Rfl:  .  escitalopram (LEXAPRO) 10 MG tablet, Take 5 mg by mouth daily. Takes 0.5 tablet, Disp: , Rfl:  .  Fexofenadine HCl (ALLEGRA ALLERGY PO), Take 1 tablet by mouth as needed (for itching)., Disp: , Rfl:  .  naproxen sodium (ALEVE) 220 MG tablet, Take 220 mg by mouth as needed., Disp: , Rfl:  .  OVER THE COUNTER MEDICATION, Apply 1 application topically daily as needed (dry skin). Water and glycerin topical solution mixed 2:1, Disp: , Rfl:  .  Propylene Glycol (SYSTANE BALANCE OP), Apply 2 drops to eye at bedtime as needed (dry eyes). , Disp: , Rfl:  .  TURMERIC PO, Take by mouth., Disp: , Rfl:  .  zolpidem (AMBIEN) 5 MG tablet, Take 2.5 mg by mouth at  bedtime as needed for sleep. Takes 0.5 tablet, Disp: , Rfl:    Review of Systems  Objective:   Physical Exam  Constitutional: She is oriented to person, place, and time. She appears well-developed and well-nourished. No distress.  HENT:  Head: Normocephalic and atraumatic.  Right Ear: External ear normal.  Left Ear: External ear normal.  Mouth/Throat: Oropharynx is clear and moist. No oropharyngeal exudate.  Mild post nasal drip Nasal twang to voice Swollen nasal turbinates  Eyes: Pupils are equal, round, and reactive to light. Conjunctivae and EOM are normal. Right eye exhibits no discharge. Left eye exhibits no discharge. No scleral icterus.  Neck: Normal range of motion. Neck supple. No JVD present. No tracheal deviation present. No thyromegaly present.  Cardiovascular: Normal rate, regular rhythm, normal heart sounds and intact distal pulses. Exam reveals no gallop and no friction rub.  No murmur heard. Pulmonary/Chest: Effort normal and breath sounds normal. No respiratory distress. She has no wheezes. She has no rales. She exhibits no tenderness.  coarse  Abdominal: Soft. Bowel sounds are normal. She exhibits no distension and no mass. There is no tenderness. There is no rebound and no guarding.  Musculoskeletal: Normal range of motion. She exhibits no edema or tenderness.  Lymphadenopathy:    She has no cervical adenopathy.  Neurological: She is alert and oriented to person, place, and time. She has normal reflexes. No cranial nerve deficit. She exhibits normal muscle tone. Coordination normal.  Skin: Skin is warm and dry. No rash noted. She is not diaphoretic. No erythema. No pallor.  Psychiatric: She has a normal mood and affect. Her behavior is normal. Judgment and thought content normal.  Vitals reviewed.   Today's Vitals   11/18/17 1121 11/18/17 1122  BP:  122/78  Pulse:  85  SpO2:  95%  Weight: 138 lb 12.8 oz (63 kg)   Height: 5\' 4"  (1.626 m)     Estimated  body mass index is 23.82 kg/m as calculated from the following:   Height as of this encounter: 5\' 4"  (1.626 m).   Weight as of this encounter: 138 lb 12.8 oz (63 kg).      Assessment & Plan:     ICD-10-CM   1. Acute sinusitis, recurrence not specified, unspecified location J01.90   2. Moderate asthma without complication, unspecified whether persistent J45.909   3. Lung nodule < 6cm on CT R91.1      Acute Sinusitis  - change doxy to Take doxycycline 100mg  po twice daily x 5 days; take after meals and avoid sunlight - take Please take prednisone 40 mg x1 day, then 30 mg x1 day, then 20 mg x1 day, then 10 mg x1 day, and then 5 mg x1 day and stop    Moderate persistent asthma with mild radiologic emphysema Continue  symbicort 80/4.5 2 puff twice daily conitnue albuterol as needed   4 mm lung nodule in the setting of 80 pack smoking history  do followup CT chest without contrast for May or    Dr. Brand Males, M.D., [facility].C.P Pulmonary and Critical Care Medicine Staff Physician, [place] Director - Interstitial Lung Disease  Program  [facility] [place] [facility] [place], [place], [address]  Pager: [phone], If no answer or between  15:00h - 7:00h: call [phone] Telephone: [phone]

## 2017-12-15 ENCOUNTER — Emergency Department (HOSPITAL_COMMUNITY): Payer: Medicare Other

## 2017-12-15 ENCOUNTER — Emergency Department (HOSPITAL_COMMUNITY)
Admission: EM | Admit: 2017-12-15 | Discharge: 2017-12-15 | Disposition: A | Discharge status: Home / Self Care | Payer: Medicare Other | Attending: Emergency Medicine | Admitting: Emergency Medicine

## 2017-12-15 ENCOUNTER — Encounter (HOSPITAL_COMMUNITY): Payer: Self-pay | Admitting: *Deleted

## 2017-12-15 ENCOUNTER — Other Ambulatory Visit: Payer: Self-pay

## 2017-12-15 DIAGNOSIS — Z7982 Long term (current) use of aspirin: Secondary | ICD-10-CM | POA: Insufficient documentation

## 2017-12-15 DIAGNOSIS — S61419A Laceration without foreign body of unspecified hand, initial encounter: Secondary | ICD-10-CM

## 2017-12-15 DIAGNOSIS — W19XXXA Unspecified fall, initial encounter: Secondary | ICD-10-CM

## 2017-12-15 DIAGNOSIS — S61412A Laceration without foreign body of left hand, initial encounter: Secondary | ICD-10-CM | POA: Insufficient documentation

## 2017-12-15 DIAGNOSIS — Y9301 Activity, walking, marching and hiking: Secondary | ICD-10-CM | POA: Diagnosis not present

## 2017-12-15 DIAGNOSIS — Z87891 Personal history of nicotine dependence: Secondary | ICD-10-CM | POA: Diagnosis not present

## 2017-12-15 DIAGNOSIS — J45909 Unspecified asthma, uncomplicated: Secondary | ICD-10-CM | POA: Insufficient documentation

## 2017-12-15 DIAGNOSIS — W010XXA Fall on same level from slipping, tripping and stumbling without subsequent striking against object, initial encounter: Secondary | ICD-10-CM | POA: Insufficient documentation

## 2017-12-15 DIAGNOSIS — Y999 Unspecified external cause status: Secondary | ICD-10-CM | POA: Diagnosis not present

## 2017-12-15 DIAGNOSIS — Z79899 Other long term (current) drug therapy: Secondary | ICD-10-CM | POA: Diagnosis not present

## 2017-12-15 DIAGNOSIS — S61212A Laceration without foreign body of right middle finger without damage to nail, initial encounter: Secondary | ICD-10-CM

## 2017-12-15 DIAGNOSIS — Y929 Unspecified place or not applicable: Secondary | ICD-10-CM | POA: Diagnosis not present

## 2017-12-15 DIAGNOSIS — S61411A Laceration without foreign body of right hand, initial encounter: Secondary | ICD-10-CM | POA: Diagnosis not present

## 2017-12-15 HISTORY — DX: Unspecified asthma, uncomplicated: J45.909

## 2017-12-15 MED ORDER — LIDOCAINE HCL (PF) 1 % IJ SOLN
5.0000 mL | Freq: Once | INTRAMUSCULAR | Status: AC
  Administered 2017-12-15: 5 mL
  Filled 2017-12-15: qty 6

## 2017-12-15 MED ORDER — LIDOCAINE HCL (PF) 2 % IJ SOLN
INTRAMUSCULAR | Status: AC
  Administered 2017-12-15: 18:00:00
  Filled 2017-12-15: qty 20

## 2017-12-15 MED ORDER — LIDOCAINE HCL (PF) 1 % IJ SOLN
INTRAMUSCULAR | Status: AC
  Administered 2017-12-15: 15:00:00
  Filled 2017-12-15: qty 2

## 2017-12-15 MED ORDER — BACITRACIN-NEOMYCIN-POLYMYXIN 400-5-5000 EX OINT
TOPICAL_OINTMENT | Freq: Once | CUTANEOUS | Status: AC
  Administered 2017-12-15: 3 via TOPICAL
  Filled 2017-12-15: qty 3

## 2017-12-15 MED ORDER — CEPHALEXIN 500 MG PO CAPS: 500.0000 mg | ORAL_CAPSULE | Freq: Four times a day (QID) | ORAL | 0 refills | Status: DC

## 2017-12-15 MED ORDER — LIDOCAINE HCL (PF) 2 % IJ SOLN
INTRAMUSCULAR | Status: AC
  Administered 2017-12-15: 10 mL
  Filled 2017-12-15: qty 60

## 2017-12-15 NOTE — Discharge Instructions (Signed)
Follow-up either with Dr. Percell Miller or with your PCP or the ER in about 1 week for removal of the stitches.

## 2017-12-15 NOTE — ED Provider Notes (Signed)
[facility] EMERGENCY DEPARTMENT Provider Note   CSN: [id number] Arrival date & time: 12/15/17  1256     History   Chief Complaint Chief Complaint  Patient presents with  . Extremity Laceration    HPI Dominique Cobb is a 72 y.o. female.  HPI Patient presents after a fall.  States she slipped on some gravel and fell forward landing on both of her palms.  Laceration to bilateral palms and mild pain to the knees and elbows.  No loss conscious.  No neck pain.  No headache.  She is not on anticoagulation.  She has had previous trigger finger surgery and carpal tunnel surgery. Past Medical History:  Diagnosis Date  . Arthritis   . Asthma   . Depression   . Dysphagia   . GERD (gastroesophageal reflux disease)     Patient Active Problem List   Diagnosis Date Noted  . Change in stool 10/15/2012  . Dysphagia 03/20/2011  . GERD (gastroesophageal reflux disease) 03/20/2011  . Depression 03/20/2011    Past Surgical History:  Procedure Laterality Date  . BREAST CYST ASPIRATION Left   . BREAST EXCISIONAL BIOPSY Left   . BREAST SURGERY     benign.  25 yrs ago  . CARPAL TUNNEL RELEASE  1980's   bilateral   . CATARACT EXTRACTION W/PHACO Left 06/12/2016   Procedure: CATARACT EXTRACTION PHACO AND INTRAOCULAR LENS PLACEMENT LEFT EYE XVQ0.08;  Surgeon: Tonny Branch, MD;  Location: [facility];  Service: Ophthalmology;  Laterality: Left;  left  . CATARACT EXTRACTION W/PHACO Right 07/24/2016   Procedure: CATARACT EXTRACTION PHACO AND INTRAOCULAR LENS PLACEMENT (IOC);  Surgeon: Tonny Branch, MD;  Location: [facility];  Service: Ophthalmology;  Laterality: Right;  right cde 8.29  . COLONOSCOPY N/A 10/29/2012   Procedure: COLONOSCOPY;  Surgeon: Rogene Houston, MD;  Location: [facility] SUITE;  Service: Endoscopy;  Laterality: N/A;  140  . Dental implants    . FINGER SURGERY     trigger finger 3rd  bilaterally  . laproscopy     over 20 yrs ago  . NECK SURGERY     2007  . TONSILLECTOMY       OB  History   None      Home Medications    Prior to Admission medications   Medication Sig Start Date End Date Taking? Authorizing Provider  albuterol (PROVENTIL HFA;VENTOLIN HFA) 108 (90 Base) MCG/ACT inhaler Inhale 2 puffs into the lungs every 6 (six) hours as needed for wheezing or shortness of breath. 10/16/17  Yes Brand Males, MD  ASPIRIN 81 PO Take 1 tablet by mouth daily.   Yes [provider]  BIOTIN PO Take 1 tablet by mouth.   Yes [provider]  budesonide-formoterol (SYMBICORT) 80-4.5 MCG/ACT inhaler Inhale 2 puffs into the lungs 2 (two) times daily. 10/16/17  Yes Brand Males, MD  buPROPion (WELLBUTRIN SR) 200 MG 12 hr tablet Take 1 tablet by mouth 2 (two) times daily. 10/19/17  Yes [provider]  escitalopram (LEXAPRO) 10 MG tablet Take 5 mg by mouth 2 (two) times daily. Takes 0.5 tablet   Yes [provider]  Fexofenadine HCl (ALLEGRA ALLERGY PO) Take 1 tablet by mouth as needed (for itching).   Yes [provider]  naproxen sodium (ALEVE) 220 MG tablet Take 220 mg by mouth as needed.   Yes [provider]  OVER THE COUNTER MEDICATION Apply 1 application topically daily as needed (dry skin). Water and glycerin topical solution mixed 2:1  Yes [provider]  Propylene Glycol (SYSTANE BALANCE OP) Apply 2 drops to eye at bedtime as needed (dry eyes).    Yes [provider]  rosuvastatin (CRESTOR) 10 MG tablet Take 1 tablet by mouth every evening. 11/23/17  Yes [provider]  TURMERIC PO Take by mouth.   Yes [provider]  zolpidem (AMBIEN) 5 MG tablet Take 2.5 mg by mouth at bedtime as needed for sleep. Takes 0.5 tablet   Yes [provider]  cephALEXin (KEFLEX) 500 MG capsule Take 1 capsule (500 mg total) by mouth 4 (four) times daily. 12/15/17   Davonna Belling, MD  doxycycline (VIBRA-TABS) 100 MG tablet Take 1 tablet (100 mg total) by mouth 2 (two) times  daily. Patient not taking: Reported on 12/15/2017 11/18/17   Brand Males, MD  predniSONE (DELTASONE) 10 MG tablet Take 40mg x1day,30mg x1day,20mg x1day,10mg x1day,then stop Patient not taking: Reported on 12/15/2017 11/18/17   Brand Males, MD    Family History Family History  Problem Relation Age of Onset  . Arthritis Father   . Thyroid disease Father     Social History Social History   Tobacco Use  . Smoking status: Former Smoker    Packs/day: 2.00    Years: 40.00    Pack years: 80.00    Types: Cigarettes    Last attempt to quit: 09/04/2004    Years since quitting: 13.2  . Smokeless tobacco: Never Used  Substance Use Topics  . Alcohol use: Yes    Comment: occasionally+  . Drug use: No     Allergies   Codeine   Review of Systems Review of Systems  Constitutional: Negative for appetite change and fever.  HENT: Negative for congestion.   Respiratory: Negative for shortness of breath.   Cardiovascular: Negative for chest pain.  Gastrointestinal: Negative for abdominal pain.  Genitourinary: Negative for flank pain.  Musculoskeletal: Negative for gait problem and neck pain.       Lacerations to hand elbow and abrasions on knees.  Skin: Positive for wound.  Neurological: Negative for weakness.     Physical Exam Updated Vital Signs BP 139/77   Pulse 89   Temp 98.8 F (37.1 C) (Temporal)   Resp 15   Ht 5\' 4"  (1.626 m)   Wt 63.5 kg (140 lb)   SpO2 99%   BMI 24.03 kg/m   Physical Exam  Constitutional: She appears well-developed.  HENT:  Head: Atraumatic.  Eyes: Pupils are equal, round, and reactive to light.  Neck: Neck supple.  Cardiovascular: Normal rate.  Abdominal: There is no tenderness.  Musculoskeletal: She exhibits tenderness.  Laceration on right palm approximately 3 cm long in the hyperthenar area.  Able to flex and extend little finger at MCP IP and DIP joint.  Good capillary refill distally.  Sensation intact distally.  Right palm has  medial abrasion approximately 1 cm long and a approximately 1 cm long curved laceration.  Abrasion to right elbow without underlying bony tenderness.  Bilateral knee abrasions without underlying bony tenderness and good range of motion.  Neurological: She is alert.  Skin: Skin is warm. Capillary refill takes less than 2 seconds.     ED Treatments / Results  Labs (all labs ordered are listed, but only abnormal results are displayed) Labs Reviewed - No data to display  EKG None  Radiology Dg Hand Complete Left  Result Date: 12/15/2017 CLINICAL DATA:  Laceration to both hands by broken glass. Initial encounter. EXAM: LEFT HAND - COMPLETE 3+ VIEW COMPARISON:  None. FINDINGS: No fracture or dislocation identified. Soft tissues show no evidence of foreign body. IMPRESSION: No acute findings. Electronically Signed   By: Aletta Edouard M.D.   On: 12/15/2017 14:02   Dg Hand Complete Right  Result Date: 12/15/2017 CLINICAL DATA:  Laceration to both hands by broken glass. Initial encounter. EXAM: RIGHT HAND - COMPLETE 3+ VIEW COMPARISON:  None. FINDINGS: No bony fracture or dislocation. Significant gauze and bandages surround the hand. There is a small radiopaque density along the palmar aspect of the hand on the lateral view which is not localizable on other views. This may not be within the soft tissues and may lie in the material outside of the hand. Correlation is suggested. IMPRESSION: No fracture. Small radiopaque density along the palmar aspect of the hand on the lateral view may or may not be within the soft tissues. This may lie within the overlying gauze and bandages surrounding the hand. Electronically Signed   By: Aletta Edouard M.D.   On: 12/15/2017 14:05    Procedures .Marland KitchenLaceration Repair Date/Time: 12/15/2017 5:08 PM Performed by: Davonna Belling, MD Authorized by: Davonna Belling, MD   Consent:    Consent obtained:  Verbal   Consent given by:  Patient   Risks discussed:   Infection, pain, need for additional repair, poor cosmetic result, retained foreign body, tendon damage, nerve damage and poor wound healing   Alternatives discussed:  No treatment and delayed treatment Anesthesia (see MAR for exact dosages):    Anesthesia method:  Local infiltration and nerve block   Local anesthetic:  Lidocaine 2% w/o epi   Block location:  Ulnar   Block needle gauge:  25 G   Block anesthetic:  Lidocaine 1% w/o epi   Block injection procedure:  Anatomic landmarks identified, introduced needle, incremental injection and anatomic landmarks palpated   Block outcome:  Incomplete block Laceration details:    Location:  Hand   Hand location:  R palm   Length (cm):  7 Repair type:    Repair type:  Complex Pre-procedure details:    Preparation:  Patient was prepped and draped in usual sterile fashion Exploration:    Wound exploration: wound explored through full range of motion and entire depth of wound probed and visualized     Wound extent: muscle damage     Contaminated: nose seen.   Treatment:    Area cleansed with:  Saline   Amount of cleaning:  Extensive   Irrigation solution:  Sterile water   Irrigation volume:  1072ml   Irrigation method:  Pressure wash   Visualized foreign bodies/material removed: no     Debridement:  Minimal Subcutaneous repair:    Suture size:  4-0   Suture material:  Vicryl   Number of sutures:  2 Skin repair:    Repair method:  Sutures   Suture size:  4-0   Wound skin closure material used: vicryl.   Suture technique:  Simple interrupted   Number of sutures:  21 Approximation:    Approximation:  Close Post-procedure details:    Dressing:  Sterile dressing   Patient tolerance of procedure:  Tolerated well, no immediate complications .Marland KitchenLaceration Repair Date/Time: 12/15/2017 5:11 PM Performed by: Davonna Belling, MD Authorized by: Davonna Belling, MD   Consent:    Consent obtained:  Verbal   Consent given by:  Patient    Risks discussed:  Infection, pain and poor cosmetic result   Alternatives discussed:  No treatment Anesthesia (see MAR for exact dosages):  Anesthesia method:  Local infiltration   Local anesthetic:  Lidocaine 2% w/o epi Laceration details:    Location:  Finger   Finger location:  R long finger   Length (cm):  1.5 Repair type:    Repair type:  Simple Pre-procedure details:    Preparation:  Patient was prepped and draped in usual sterile fashion Exploration:    Wound exploration: wound explored through full range of motion and entire depth of wound probed and visualized     Contaminated: no   Treatment:    Amount of cleaning:  Standard   Irrigation method:  Syringe   Visualized foreign bodies/material removed: no   Skin repair:    Repair method:  Sutures   Suture size:  4-0   Wound skin closure material used: vicryl.   Suture technique:  Simple interrupted   Number of sutures:  6 Approximation:    Approximation:  Close Post-procedure details:    Dressing:  Sterile dressing   Patient tolerance of procedure:  Tolerated well, no immediate complications .Marland KitchenLaceration Repair Date/Time: 12/15/2017 5:12 PM Performed by: Davonna Belling, MD Authorized by: Davonna Belling, MD   Consent:    Consent obtained:  Verbal   Consent given by:  Patient   Risks discussed:  Infection, pain, vascular damage, tendon damage, poor cosmetic result and nerve damage   Alternatives discussed:  No treatment Anesthesia (see MAR for exact dosages):    Anesthesia method:  Local infiltration   Local anesthetic:  Lidocaine 1% w/o epi Laceration details:    Location:  Hand   Hand location:  L palm   Length (cm):  1 Repair type:    Repair type:  Simple Pre-procedure details:    Preparation:  Patient was prepped and draped in usual sterile fashion and imaging obtained to evaluate for foreign bodies Exploration:    Wound exploration: wound explored through full range of motion and entire depth of  wound probed and visualized     Wound extent: no muscle damage noted     Contaminated: no   Treatment:    Area cleansed with:  Saline   Irrigation method:  Syringe   Visualized foreign bodies/material removed: no   Skin repair:    Repair method:  Sutures   Suture size:  5-0   Wound skin closure material used: vicryl.   Number of sutures:  4 Approximation:    Approximation:  Close Post-procedure details:    Dressing:  Sterile dressing   Patient tolerance of procedure:  Tolerated well, no immediate complications   (including critical care time)  Medications Ordered in ED Medications  lidocaine (XYLOCAINE) 2 % injection (has no administration in time range)  lidocaine (PF) (XYLOCAINE) 1 % injection 5 mL (5 mLs Infiltration Given by Other 12/15/17 1455)  lidocaine (PF) (XYLOCAINE) 1 % injection 5 mL (5 mLs Infiltration Given by Other 12/15/17 1456)  lidocaine (PF) (XYLOCAINE) 1 % injection (  Given by Other 12/15/17 1456)  lidocaine (PF) (XYLOCAINE) 1 % injection (  Given by Other 12/15/17 1508)  lidocaine (XYLOCAINE) 2 % injection (10 mLs  Given by Other 12/15/17 1604)     Initial Impression / Assessment and Plan / ED Course  I have reviewed the triage vital signs and the nursing notes.  Pertinent labs & imaging results that were available during my care of the patient were reviewed by me and considered in my medical decision making (see chart for details).    Patient with fall.  X-ray done and  showed possible foreign body in right hand but not seen with examination.  Patient for the possibility of still having a piece of glass or dirt in there.  Copiously irrigated.  Wounds closed.  Follow-up with either PCP ER or Dr. Percell Miller, who she is seen in the past.  Discharge home.  Given antibiotics.  Final Clinical Impressions(s) / ED Diagnoses   Final diagnoses:  Fall, initial encounter  Laceration of hand, foreign body presence unspecified, unspecified laterality, initial encounter   Laceration of right middle finger without foreign body without damage to nail, initial encounter    ED Discharge Orders        Ordered    cephALEXin (KEFLEX) 500 MG capsule  4 times daily     12/15/17 1648       Davonna Belling, MD 12/15/17 1713

## 2017-12-15 NOTE — ED Triage Notes (Signed)
Pt fell today outside and has laceration to right palm, superficial cuts and abrasions to left hand and bilateral kness. Pt c/o some pain with movement of left knee. Denies LOC upon fall. Pt denies dizziness prior to fall and reports she just slipped. Pt's right hand bleeding, non adherent bandage placed and wrapped with gauze in triage.

## 2017-12-15 NOTE — ED Notes (Signed)
Patient transported to X-ray 

## 2018-01-18 ENCOUNTER — Ambulatory Visit: Payer: Medicare Other | Admitting: Internal Medicine

## 2018-01-22 ENCOUNTER — Ambulatory Visit: Payer: Medicare Other | Admitting: Internal Medicine

## 2018-01-22 ENCOUNTER — Encounter: Payer: Self-pay | Admitting: Internal Medicine

## 2018-01-22 VITALS — BP 140/76 | HR 76 | Ht 64.0 in | Wt 139.6 lb

## 2018-01-22 DIAGNOSIS — J45909 Unspecified asthma, uncomplicated: Secondary | ICD-10-CM

## 2018-01-22 DIAGNOSIS — R911 Solitary pulmonary nodule: Secondary | ICD-10-CM

## 2018-01-22 LAB — NITRIC OXIDE: Nitric Oxide: 10

## 2018-01-22 MED ORDER — BUDESONIDE 180 MCG/ACT IN AEPB: 2.0000 | INHALATION_SPRAY | Freq: Every day | RESPIRATORY_TRACT | 0 refills | Status: DC

## 2018-01-22 MED ORDER — BUDESONIDE 180 MCG/ACT IN AEPB: 2.0000 | INHALATION_SPRAY | Freq: Every day | RESPIRATORY_TRACT | 11 refills | Status: DC

## 2018-01-22 MED ORDER — BUDESONIDE 180 MCG/ACT IN AEPB: 2.0000 | INHALATION_SPRAY | Freq: Two times a day (BID) | RESPIRATORY_TRACT | 0 refills | Status: DC

## 2018-01-22 MED ORDER — BUDESONIDE 180 MCG/ACT IN AEPB: 2.0000 | INHALATION_SPRAY | Freq: Two times a day (BID) | RESPIRATORY_TRACT | 11 refills | Status: DC

## 2018-01-22 NOTE — Progress Notes (Signed)
Subjective:     Patient ID: Dominique Cobb, female   DOB: 13-Sep-1945, 72 y.o.   MRN: [id number]  HPI  IOV 09/04/2017  Chief Complaint  Patient presents with  . Consult    Referred by Dr. Inda Merlin for cough.  Pt states she had an episode beginning 12/1 which lasted for two weeks and then in mid January 2019 had another episode with cough. Denies any current complaints of cough, or CP but has some mild SOB and has some mucus in chest/throat.  Pt is currently taking prednisone.   72 year old patient referred by Dr. Inda Merlin for evaluation of cough.  History is gained from talking to her and review of referral records.  Patient tells me that at baseline she never had any cough or shortness of breath but on deeper questioning she did admit to very mild dyspnea on exertion going on for some months or years.  But it was barely noticeable.  Then in early December 2000 and she did have an episode of bronchitis that resulted in cough lasting a few weeks.  This resolved.  Then in January 2019 she had another episode of bronchitis and the cough is still lingered as of this date.  She initially required a round of antibiotic and later prednisone which she says she did not take correctly.  Then in February another course of prednisone.  Currently on third course of prednisone for a few days and has several more days to go.  With this third course of prednisone she is beginning to feel better.  Cough was initially severe and associated with inability to lie flat and wheezing.  But with his third round of prednisone it is now resolved to mild and she is able to sleep in her bed shortness of breath is around the same there is no sputum although review of the records indicate that she did have some green sputum in the past.  She is not on any maintenance inhalers but has taken albuterol in the past with relief.  Exposure history: She lives in a 74 year old home and is lived in the same home for 30 years.  She has noted some  mold in the windows.  In addition she uses a wood stove in the basement during the winter season which is been doing for many years.  Also has a history of radon exposure in the same home when she got it tested over 10 years ago.  She is also been a smoker 80 pack smoking history having quit within the last 15 years or less.     OV 10/16/2017  Chief Complaint  Patient presents with  . Follow-up    PFT done 4/12 and HRCT done 5/7.  Pt states when she was on prednisone, the cough and wheezing stopped. Around Easter, pt's cough came back bad. Pt states it is hard for her to cough mucus up but has it in her chest.    Follow-up chronic cough in the setting of radon exposure, prior 80 pack smoking history and more recent mold exposure.  At last visit when I saw her for the first time in April 2019 exam nitric oxide was slightly in the gray zone and in the setting of prednisone her cough is getting better.  She returns now after finishing the prednisone and completing work-up with high-resolution CT chest and pulmonary function test.  She had her pulmonary function test September 11, 2017 prior to Easter and this is normal and I personally visualized the  image.  She did have high-resolution CT chest in May 2019 and there is no interstitial lung disease oror cancer.  She has tiny 4 mm lung nodule and some really mild emphysema.  However she tells me that after she finished her prednisone around Easter 2019 her cough returned with a vengeance and she has had significant wheezing.  In fact her cough symptom score documented below shows severe amount of symptoms.  She has nocturnal cough.  So we retested exam nitric oxide today and is significantly elevated at 59 ppb   IMPRESSION: - personally visualized image and agree with findings 1. No definitive evidence to suggest interstitial lung disease. There are some areas of parenchymal banding in the lung bases bilaterally which are strongly favored to reflect  areas of chronic post infectious or inflammatory scarring. 2. Mild diffuse bronchial thickening with mild centrilobular and paraseptal emphysema; imaging findings suggestive of underlying COPD. 3. Multiple tiny 2-4 mm pulmonary nodules scattered throughout both lungs, nonspecific, but statistically likely benign. No follow-up needed if patient is low-risk (and has no known or suspected primary neoplasm). Non-contrast chest CT can be considered in 12 months if patient is high-risk. This recommendation follows the consensus statement: Guidelines for Management of Incidental Pulmonary Nodules Detected on CT Images: From the Fleischner Society 2017; Radiology 2017; 284:228-243. 4. Aortic atherosclerosis, in addition to 2 vessel coronary artery disease. Assessment for potential risk factor modification, dietary therapy or pharmacologic therapy may be warranted, if clinically indicated.  Aortic Atherosclerosis (ICD10-I70.0) and Emphysema (ICD10-J43.9).   Electronically Signed   By: Vinnie Langton M.D.   On: 10/06/2017 16:30     OV 11/18/2017  Chief Complaint  Patient presents with  . Follow-up    6/15 Sinus started to drain yellow in color. Having chest congestion, wheezing coughing productive yellow mucus. She had some left over doxycline and has taken 50mg  qd for 8 days now.   Dominique Cobb , 72 y.o. , with dob 01-Aug-1945 and female ,Not Hispanic or Latino from [address] [place] [address] - presents to lung clinic for chronic cough - cough variant asthma/midl radiologic emphsyema + lung nodule 25mm with prior 80ppd smokhng hx  -Last visit I started her on Symbicort and given prednisone. After this the cough resolved essentially and the wheezing completely resolved. This happened to the point that she actually stopped taking her Symbicort and then she she developed acute sinus infection with yellow sinus drainage and change in voiceand ear blockage. She had some leftover  doxycycline 50 mg tablets given earlier sometime back by her primary care physician assistant. She started taking this at 50 mg once a day and is only some better. She now feels the cough is descended into her lungs and is having some wheezing. Exhaled nitric oxide today is normal but she feels she'll benefit from prednisone. RSI cough score deteriorated and is documented below.    OV 01/22/2018  Chief Complaint  Patient presents with  . Follow-up    Pt states she has been doing good since last visit and denies any complaints or concerns.   For cough. Asthma with nodule and emphysema  She is doing really well. Exhaled nitric oxide is 10 ppb. RSI cough score is minimally symptomatic. The main issues that she cut her right hand with glass and she's lost significant function. She has 28 stitches. She is going to start occupational therapy. SHe is open to reducing her be escalating Symbicort 2 inhaled steroid alone.  Dr Lorenza Cambridge Reflux Symptom Index (> 13-15 suggestive of LPR cough) 09/04/2017 feno 0 29 oon pred 10/16/2017 feno - 59ppb 11/18/2017 feno 7ppbm 01/22/2018 feno 10  Hoarseness of problem with voice 5 1 2  0  Clearing  Of Throat 5 2 2 2   Excess throat mucus or feeling of post nasal drip 5 4 5 2   Difficulty swallowing food, liquid or tablets 4 4 5 4   Cough after eating or lying down 4 3 5  0  Breathing difficulties or choking episodes 3 4 3  0  Troublesome or annoying cough 5 5 5  0  Sensation of something sticking in throat or lump in throat 5 4 0 2  Heartburn, chest pain, indigestion, or stomach acid coming up ? 4 0 2  TOTAL  31 27 12      Feno 09/04/2017 - 29 ppb (on prednsione) ->  Repeat FeNO 10/16/2017 ->  59       has a past medical history of Arthritis, Asthma, Depression, Dysphagia, and GERD (gastroesophageal reflux disease).   reports that she quit smoking about 13 years ago. Her smoking use included cigarettes. She has a 80.00 pack-year smoking history. She has never  used smokeless tobacco.  Past Surgical History:  Procedure Laterality Date  . BREAST CYST ASPIRATION Left   . BREAST EXCISIONAL BIOPSY Left   . BREAST SURGERY     benign.  25 yrs ago  . CARPAL TUNNEL RELEASE  1980's   bilateral   . CATARACT EXTRACTION W/PHACO Left 06/12/2016   Procedure: CATARACT EXTRACTION PHACO AND INTRAOCULAR LENS PLACEMENT LEFT EYE HLK5.62;  Surgeon: Tonny Branch, MD;  Location: [facility];  Service: Ophthalmology;  Laterality: Left;  left  . CATARACT EXTRACTION W/PHACO Right 07/24/2016   Procedure: CATARACT EXTRACTION PHACO AND INTRAOCULAR LENS PLACEMENT (IOC);  Surgeon: Tonny Branch, MD;  Location: [facility];  Service: Ophthalmology;  Laterality: Right;  right cde 8.29  . COLONOSCOPY N/A 10/29/2012   Procedure: COLONOSCOPY;  Surgeon: Rogene Houston, MD;  Location: [facility];  Service: Endoscopy;  Laterality: N/A;  140  . Dental implants    . FINGER SURGERY     trigger finger 3rd  bilaterally  . laproscopy     over 20 yrs ago  . NECK SURGERY     2007  . TONSILLECTOMY      Allergies  Allergen Reactions  . Codeine Other (See Comments)    Sweating and Patient just doesn't like.     Immunization History  Administered Date(s) Administered  . Influenza, High Dose Seasonal PF 03/02/2017    Family History  Problem Relation Age of Onset  . Arthritis Father   . Thyroid disease Father      Current Outpatient Medications:  .  albuterol (PROVENTIL HFA;VENTOLIN HFA) 108 (90 Base) MCG/ACT inhaler, Inhale 2 puffs into the lungs every 6 (six) hours as needed for wheezing or shortness of breath., Disp: 1 Inhaler, Rfl: 6 .  ASPIRIN 81 PO, Take 1 tablet by mouth daily., Disp: , Rfl:  .  BIOTIN PO, Take 1 tablet by mouth., Disp: , Rfl:  .  budesonide-formoterol (SYMBICORT) 80-4.5 MCG/ACT inhaler, Inhale 2 puffs into the lungs 2 (two) times daily., Disp: 1 Inhaler, Rfl: 12 .  buPROPion (WELLBUTRIN SR) 200 MG 12 hr tablet, Take 1 tablet by mouth 2 (two) times daily., Disp: ,  Rfl:  .  Calcium Polycarbophil (FIBER-CAPS PO), Take 6 capsules by mouth daily., Disp: , Rfl:  .  escitalopram (LEXAPRO) 10 MG tablet, Take  5 mg by mouth 2 (two) times daily. Takes 0.5 tablet, Disp: , Rfl:  .  Fexofenadine HCl (ALLEGRA ALLERGY PO), Take 1 tablet by mouth as needed (for itching)., Disp: , Rfl:  .  naproxen sodium (ALEVE) 220 MG tablet, Take 220 mg by mouth as needed., Disp: , Rfl:  .  OVER THE COUNTER MEDICATION, Apply 1 application topically daily as needed (dry skin). Water and glycerin topical solution mixed 2:1, Disp: , Rfl:  .  rosuvastatin (CRESTOR) 10 MG tablet, Take 1 tablet by mouth every evening., Disp: , Rfl:  .  TURMERIC PO, Take by mouth., Disp: , Rfl:  .  zolpidem (AMBIEN) 5 MG tablet, Take 2.5 mg by mouth at bedtime as needed for sleep. Takes 0.5 tablet, Disp: , Rfl:  .  Propylene Glycol (SYSTANE BALANCE OP), Apply 2 drops to eye at bedtime as needed (dry eyes). , Disp: , Rfl:    Review of Systems     Objective:   Physical Exam  Constitutional: She is oriented to person, place, and time. She appears well-developed and well-nourished. No distress.  HENT:  Head: Normocephalic and atraumatic.  Right Ear: External ear normal.  Left Ear: External ear normal.  Mouth/Throat: Oropharynx is clear and moist. No oropharyngeal exudate.  Eyes: Pupils are equal, round, and reactive to light. Conjunctivae and EOM are normal. Right eye exhibits no discharge. Left eye exhibits no discharge. No scleral icterus.  Neck: Normal range of motion. Neck supple. No JVD present. No tracheal deviation present. No thyromegaly present.  Cardiovascular: Normal rate, regular rhythm, normal heart sounds and intact distal pulses. Exam reveals no gallop and no friction rub.  No murmur heard. Pulmonary/Chest: Effort normal and breath sounds normal. No respiratory distress. She has no wheezes. She has no rales. She exhibits no tenderness.  Abdominal: Soft. Bowel sounds are normal. She exhibits  no distension and no mass. There is no tenderness. There is no rebound and no guarding.  Musculoskeletal: Normal range of motion. She exhibits no edema or tenderness.  Mild atrophy in the right hand was of good function of the fingers and also long scar in the thenar area  Lymphadenopathy:    She has no cervical adenopathy.  Neurological: She is alert and oriented to person, place, and time. She has normal reflexes. No cranial nerve deficit. She exhibits normal muscle tone. Coordination normal.  Skin: Skin is warm and dry. No rash noted. She is not diaphoretic. No erythema. No pallor.  Psychiatric: She has a normal mood and affect. Her behavior is normal. Judgment and thought content normal.  Vitals reviewed.  Vitals:   01/22/18 1144  BP: 140/76  Pulse: 76  SpO2: 95%  Weight: 139 lb 9.6 oz (63.3 kg)  Height: 5\' 4"  (1.626 m)    Estimated body mass index is 23.96 kg/m as calculated from the following:   Height as of this encounter: 5\' 4"  (1.626 m).   Weight as of this encounter: 139 lb 9.6 oz (63.3 kg).     Assessment:       ICD-10-CM   1. Moderate asthma without complication, unspecified whether persistent J45.909   2. Solitary pulmonary nodule R91.1 CT Chest Wo Contrast       Plan:     Moderate persistent asthma with mild radiologic emphysema - Doing well - stop symbicort - take pulmicort 159mcg , 2 puff twice daily - conitnue albuterol as needed - flu shot in fall  - if coug gets worse, call for prednisone  and escalation back to symbicort  4 mm lung nodule in the setting of 80 pack smoking history  do followup CT chest without contrast for May or June 2020  Followup 4 months or sooner if needed  - best wishes for hand recovery   Dr. Brand Males, M.D., [facility].C.P Pulmonary and Critical Care Medicine Staff Physician, [place] Director - Interstitial Lung Disease  Program  Pulmonary [place] at [place], [place], [address]  Pager: [phone], If no answer or between  15:00h - 7:00h: call [phone] Telephone: [phone]

## 2018-01-22 NOTE — Addendum Note (Signed)
Addended by: Lorretta Harp on: 01/22/2018 01:25 PM   Modules accepted: Orders

## 2018-01-22 NOTE — Patient Instructions (Signed)
Moderate persistent asthma with mild radiologic emphysema - Doing well - stop symbicort - take pulmicort 17mcg , 2 puff twice daily - conitnue albuterol as needed - flu shot in fall  - if coug gets worse, call for prednisone and escalation back to symbicort  4 mm lung nodule in the setting of 80 pack smoking history  do followup CT chest without contrast for May or June 2020  Followup 4 months or sooner if needed  - best wishes for hand recovery

## 2018-01-26 ENCOUNTER — Telehealth: Payer: Self-pay | Admitting: Internal Medicine

## 2018-01-26 NOTE — Telephone Encounter (Signed)
PA started on CMM KEY: [id number]    PA approved from 01-26-18 through 06-01-18 through Medicare Part D. Approval has been faxed to pharmacy. Nothing more needed at this time.

## 2018-01-27 ENCOUNTER — Telehealth: Payer: Self-pay | Admitting: Internal Medicine

## 2018-01-27 NOTE — Telephone Encounter (Signed)
PA request from [facility] for Pulmicort.  PA has been initiated, and approved through 12.31.2019.  Reference #: [id number] Nothing further needed.

## 2018-03-05 ENCOUNTER — Other Ambulatory Visit: Payer: Self-pay | Admitting: Internal Medicine

## 2018-03-05 DIAGNOSIS — Z1231 Encounter for screening mammogram for malignant neoplasm of breast: Secondary | ICD-10-CM

## 2018-03-08 ENCOUNTER — Encounter (INDEPENDENT_AMBULATORY_CARE_PROVIDER_SITE_OTHER): Payer: Self-pay

## 2018-03-08 ENCOUNTER — Ambulatory Visit
Admission: RE | Admit: 2018-03-08 | Discharge: 2018-03-08 | Disposition: A | Discharge status: Home / Self Care | Payer: Medicare Other | Source: Ambulatory Visit | Attending: Internal Medicine | Admitting: Internal Medicine

## 2018-03-08 DIAGNOSIS — Z1231 Encounter for screening mammogram for malignant neoplasm of breast: Secondary | ICD-10-CM

## 2018-04-06 ENCOUNTER — Telehealth: Payer: Self-pay | Admitting: Internal Medicine

## 2018-04-06 NOTE — Telephone Encounter (Signed)
Called patient unable to reach and unable to leave message as phone kept ringing.  

## 2018-04-08 MED ORDER — BUDESONIDE 180 MCG/ACT IN AEPB: 2.0000 | INHALATION_SPRAY | Freq: Two times a day (BID) | RESPIRATORY_TRACT | 5 refills | Status: DC

## 2018-04-08 NOTE — Telephone Encounter (Signed)
Called and spoke to patient, requesting that we send pulmicort to [facility]. Script sent. Patient voiced understanding. Nothing further needed at this time.

## 2018-04-08 NOTE — Telephone Encounter (Signed)
ATC pt, no answer. Left message for pt to call back.  

## 2018-04-08 NOTE — Telephone Encounter (Signed)
Pt is returning call. Cb is [phone].

## 2018-04-13 ENCOUNTER — Other Ambulatory Visit: Payer: Self-pay

## 2018-04-13 MED ORDER — BUDESONIDE 180 MCG/ACT IN AEPB: 2.0000 | INHALATION_SPRAY | Freq: Two times a day (BID) | RESPIRATORY_TRACT | 3 refills | Status: DC

## 2018-06-04 ENCOUNTER — Telehealth: Payer: Self-pay | Admitting: Internal Medicine

## 2018-06-04 NOTE — Telephone Encounter (Signed)
Called and spoke with Patient. Wyn Quaker, NP, recommendations given.  Understanding stated.  Nothing further at this time.

## 2018-06-04 NOTE — Telephone Encounter (Signed)
Called and spoke with pt who stated symptoms began 2 days ago of wheezing, coughing clear mucus currently, ears are stopped up, and sinuses are stopped up too.   Pt denies any fever.   Pt stated that she has not tried any OTC meds and stated due to her symptoms just beginning, she wanted to know what could be recommended for her to take or if anything could be prescribed to help her out.  Pt does have a f/u visit scheduled with MR 06/10/18 at [place], please advise recs for pt. Thanks!

## 2018-06-04 NOTE — Telephone Encounter (Signed)
I am sorry the patient is a feeling well.  Patient is not been seen here in 6 months.  This does sound viral by nature of her description.  Patient has not even tried over-the-counter measures prior to contact our office.  I do not believe antibiotics are indicated at this time.  Patient to keep follow-up with Dr. Chase Caller on 06/10/2018 at 3 PM.  If symptoms are still occurring at that time he can assess her for medication needs.  Over-the-counter options patient can start: Flonase 1 spray each nostril daily for allergy-like symptoms or nasal congestion Patient can also use Mucinex D or Delsym over-the-counter cough syrup for management of congestion  Wyn Quaker, FNP

## 2018-06-10 ENCOUNTER — Encounter: Payer: Self-pay | Admitting: Internal Medicine

## 2018-06-10 ENCOUNTER — Ambulatory Visit: Payer: Medicare Other | Admitting: Internal Medicine

## 2018-06-10 VITALS — BP 118/70 | HR 80 | Ht 64.0 in | Wt 140.6 lb

## 2018-06-10 DIAGNOSIS — R911 Solitary pulmonary nodule: Secondary | ICD-10-CM

## 2018-06-10 DIAGNOSIS — J45901 Unspecified asthma with (acute) exacerbation: Secondary | ICD-10-CM | POA: Diagnosis not present

## 2018-06-10 DIAGNOSIS — J3489 Other specified disorders of nose and nasal sinuses: Secondary | ICD-10-CM | POA: Diagnosis not present

## 2018-06-10 DIAGNOSIS — IMO0001 Reserved for inherently not codable concepts without codable children: Secondary | ICD-10-CM

## 2018-06-10 MED ORDER — FLUTICASONE PROPIONATE 50 MCG/ACT NA SUSP: 2.0000 | Freq: Every day | NASAL | 2 refills | Status: DC

## 2018-06-10 MED ORDER — PREDNISONE 10 MG PO TABS: ORAL_TABLET | ORAL | 0 refills | Status: DC

## 2018-06-10 MED ORDER — DOXYCYCLINE HYCLATE 100 MG PO TABS: 100.0000 mg | ORAL_TABLET | Freq: Two times a day (BID) | ORAL | 0 refills | Status: DC

## 2018-06-10 NOTE — Patient Instructions (Addendum)
Asthma with acute exacerbation, unspecified asthma severity, unspecified whether persistent  - Take prednisone 40 mg daily x 2 days, then 20mg  daily x 2 days, then 10mg  daily x 2 days, then 5mg  daily x 2 days and stop - Take doxycycline 100mg  po twice daily x 5 days; take after meals and avoid sunlight - do spirometry/dlco for followup in June 2020 - continue pulmicort as before with albuterol as needed   Sinus drainage - treat as above but in a week from now if sinus drainage still persists - start  take generic fluticasone inhaler 2 squirts each nostril daily (take prescription)   Lung nodule < 6cm on CT - do ct scan chest without contrast in June 2020  Followup  - after spirometry/dlco and CT in June 2020; return sooner if needed

## 2018-06-10 NOTE — Progress Notes (Signed)
IOV 09/04/2017  Chief Complaint  Patient presents with  . Consult    Referred by Dr. Inda Merlin for cough.  Pt states she had an episode beginning 12/1 which lasted for two weeks and then in mid January 2019 had another episode with cough. Denies any current complaints of cough, or CP but has some mild SOB and has some mucus in chest/throat.  Pt is currently taking prednisone.   73 year old patient referred by Dr. Inda Merlin for evaluation of cough.  History is gained from talking to her and review of referral records.  Patient tells me that at baseline she never had any cough or shortness of breath but on deeper questioning she did admit to very mild dyspnea on exertion going on for some months or years.  But it was barely noticeable.  Then in early December 2000 and she did have an episode of bronchitis that resulted in cough lasting a few weeks.  This resolved.  Then in January 2019 she had another episode of bronchitis and the cough is still lingered as of this date.  She initially required a round of antibiotic and later prednisone which she says she did not take correctly.  Then in February another course of prednisone.  Currently on third course of prednisone for a few days and has several more days to go.  With this third course of prednisone she is beginning to feel better.  Cough was initially severe and associated with inability to lie flat and wheezing.  But with his third round of prednisone it is now resolved to mild and she is able to sleep in her bed shortness of breath is around the same there is no sputum although review of the records indicate that she did have some green sputum in the past.  She is not on any maintenance inhalers but has taken albuterol in the past with relief.  Exposure history: She lives in a 75 year old home and is lived in the same home for 30 years.  She has noted some mold in the windows.  In addition she uses a wood stove in the basement during the winter season  which is been doing for many years.  Also has a history of radon exposure in the same home when she got it tested over 10 years ago.  She is also been a smoker 80 pack smoking history having quit within the last 15 years or less.     OV 10/16/2017  Chief Complaint  Patient presents with  . Follow-up    PFT done 4/12 and HRCT done 5/7.  Pt states when she was on prednisone, the cough and wheezing stopped. Around Easter, pt's cough came back bad. Pt states it is hard for her to cough mucus up but has it in her chest.    Follow-up chronic cough in the setting of radon exposure, prior 80 pack smoking history and more recent mold exposure.  At last visit when I saw her for the first time in April 2019 exam nitric oxide was slightly in the gray zone and in the setting of prednisone her cough is getting better.  She returns now after finishing the prednisone and completing work-up with high-resolution CT chest and pulmonary function test.  She had her pulmonary function test September 11, 2017 prior to Easter and this is normal and I personally visualized the image.  She did have high-resolution CT chest in May 2019 and there is no interstitial lung disease oror cancer.  She has tiny 4 mm lung nodule and some really mild emphysema.  However she tells me that after she finished her prednisone around Easter 2019 her cough returned with a vengeance and she has had significant wheezing.  In fact her cough symptom score documented below shows severe amount of symptoms.  She has nocturnal cough.  So we retested exam nitric oxide today and is significantly elevated at 59 ppb   IMPRESSION: - personally visualized image and agree with findings 1. No definitive evidence to suggest interstitial lung disease. There are some areas of parenchymal banding in the lung bases bilaterally which are strongly favored to reflect areas of chronic post infectious or inflammatory scarring. 2. Mild diffuse bronchial thickening  with mild centrilobular and paraseptal emphysema; imaging findings suggestive of underlying COPD. 3. Multiple tiny 2-4 mm pulmonary nodules scattered throughout both lungs, nonspecific, but statistically likely benign. No follow-up needed if patient is low-risk (and has no known or suspected primary neoplasm). Non-contrast chest CT can be considered in 12 months if patient is high-risk. This recommendation follows the consensus statement: Guidelines for Management of Incidental Pulmonary Nodules Detected on CT Images: From the Fleischner Society 2017; Radiology 2017; 284:228-243. 4. Aortic atherosclerosis, in addition to 2 vessel coronary artery disease. Assessment for potential risk factor modification, dietary therapy or pharmacologic therapy may be warranted, if clinically indicated.  Aortic Atherosclerosis (ICD10-I70.0) and Emphysema (ICD10-J43.9).   Electronically Signed   By: Vinnie Langton M.D.   On: 10/06/2017 16:30     OV 11/18/2017  Chief Complaint  Patient presents with  . Follow-up    6/15 Sinus started to drain yellow in color. Having chest congestion, wheezing coughing productive yellow mucus. She had some left over doxycline and has taken 50mg  qd for 8 days now.   Dominique Cobb , 73 y.o. , with dob 1945-12-06 and female ,Not Hispanic or Latino from [address] [place] [address] - presents to lung clinic for chronic cough - cough variant asthma/midl radiologic emphsyema + lung nodule 54mm with prior 80ppd smokhng hx  -Last visit I started her on Symbicort and given prednisone. After this the cough resolved essentially and the wheezing completely resolved. This happened to the point that she actually stopped taking her Symbicort and then she she developed acute sinus infection with yellow sinus drainage and change in voiceand ear blockage. She had some leftover doxycycline 50 mg tablets given earlier sometime back by her primary care physician assistant. She  started taking this at 50 mg once a day and is only some better. She now feels the cough is descended into her lungs and is having some wheezing. Exhaled nitric oxide today is normal but she feels she'll benefit from prednisone. RSI cough score deteriorated and is documented below.    OV 01/22/2018  Chief Complaint  Patient presents with  . Follow-up    Pt states she has been doing good since last visit and denies any complaints or concerns.   For cough. Asthma with nodule and emphysema  She is doing really well. Exhaled nitric oxide is 10 ppb. RSI cough score is minimally symptomatic. The main issues that she cut her right hand with glass and she's lost significant function. She has 28 stitches. She is going to start occupational therapy. SHe is open to reducing her be escalating Symbicort 2 inhaled steroid alone.   [place]eno 09/04/2017 - 29 ppb (on prednsione) ->  Repeat FeNO 10/16/2017 ->  59    OV 06/10/2018  Subjective:  Patient ID: Dominique Cobb, female , DOB: 09-Feb-1946 , age 68 y.o. , MRN: [id number] , ADDRESS: [address] [place] [address]   06/10/2018 -   Chief Complaint  Patient presents with  . Follow-up    Pt states she had been doing good until the first of January 2020. States she has a cough with yellow mucus and clear sinus drainage. Pt states she does have some mild chest tightness.     HPI DECEMBER HEDTKE 73 y.o. -here on an acute visit.  She has asthma with emphysema and a lung nodule  She tells me that the house she lives is on a gravel road and has timber dust and she is constantly being exposed to it but in reality for the holiday she went to [place] and after the new year 2020 she developed respiratory infection with sinus congestion that is distended down to her chest and is causing coughing, wheezing and no sputum.  She is only somewhat better but she still has a wheeze.  She continues to be on Pulmicort.  She says that compared to previous times she is still  able to sleep because of pulmicort     Asthma Control Panel 06/10/2018   Current Med Regimen   ACQ 5 point- 1 week. wtd avg score. <1.0 is good control 0.75-1.25 is grey zone. >1.25 poor control. Delta 0.5 is clinically meaningful 1.4  FeNO ppB 8  FeV1    Planned intervention  for visit        ROS - per HPI     has a past medical history of Arthritis, Asthma, Depression, Dysphagia, and GERD (gastroesophageal reflux disease).   reports that she quit smoking about 13 years ago. Her smoking use included cigarettes. She has a 80.00 pack-year smoking history. She has never used smokeless tobacco.  Past Surgical History:  Procedure Laterality Date  . BREAST CYST ASPIRATION Left   . BREAST EXCISIONAL BIOPSY Left   . BREAST SURGERY     benign.  25 yrs ago  . CARPAL TUNNEL RELEASE  1980's   bilateral   . CATARACT EXTRACTION W/PHACO Left 06/12/2016   Procedure: CATARACT EXTRACTION PHACO AND INTRAOCULAR LENS PLACEMENT LEFT EYE AGT3.64;  Surgeon: Tonny Branch, MD;  Location: [facility];  Service: Ophthalmology;  Laterality: Left;  left  . CATARACT EXTRACTION W/PHACO Right 07/24/2016   Procedure: CATARACT EXTRACTION PHACO AND INTRAOCULAR LENS PLACEMENT (IOC);  Surgeon: Tonny Branch, MD;  Location: [facility];  Service: Ophthalmology;  Laterality: Right;  right cde 8.29  . COLONOSCOPY N/A 10/29/2012   Procedure: COLONOSCOPY;  Surgeon: Rogene Houston, MD;  Location: [facility];  Service: Endoscopy;  Laterality: N/A;  140  . Dental implants    . FINGER SURGERY     trigger finger 3rd  bilaterally  . laproscopy     over 20 yrs ago  . NECK SURGERY     2007  . TONSILLECTOMY      Allergies  Allergen Reactions  . Codeine Other (See Comments)    Sweating and Patient just doesn't like.     Immunization History  Administered Date(s) Administered  . Influenza, High Dose Seasonal PF 03/02/2017    Family History  Problem Relation Age of Onset  . Arthritis Father   . Thyroid disease Father       Current Outpatient Medications:  .  albuterol (PROVENTIL HFA;VENTOLIN HFA) 108 (90 Base) MCG/ACT inhaler, Inhale 2 puffs into the lungs every 6 (six)  hours as needed for wheezing or shortness of breath., Disp: 1 Inhaler, Rfl: 6 .  ASPIRIN 81 PO, Take 1 tablet by mouth daily., Disp: , Rfl:  .  budesonide (PULMICORT FLEXHALER) 180 MCG/ACT inhaler, Inhale 2 puffs into the lungs 2 (two) times daily., Disp: 1 each, Rfl: 11 .  buPROPion (WELLBUTRIN SR) 200 MG 12 hr tablet, Take 1 tablet by mouth 2 (two) times daily., Disp: , Rfl:  .  dextromethorphan-guaiFENesin (MUCINEX DM) 30-600 MG 12hr tablet, Take 1 tablet by mouth 2 (two) times daily., Disp: , Rfl:  .  escitalopram (LEXAPRO) 10 MG tablet, Take 5 mg by mouth 2 (two) times daily. Takes 0.5 tablet, Disp: , Rfl:  .  ibuprofen (ADVIL,MOTRIN) 200 MG tablet, Take 200 mg by mouth every 6 (six) hours as needed., Disp: , Rfl:  .  OVER THE COUNTER MEDICATION, Apply 1 application topically daily as needed (dry skin). Water and glycerin topical solution mixed 2:1, Disp: , Rfl:  .  Propylene Glycol (SYSTANE BALANCE OP), Apply 2 drops to eye at bedtime as needed (dry eyes). , Disp: , Rfl:  .  rosuvastatin (CRESTOR) 10 MG tablet, Take 1 tablet by mouth every evening., Disp: , Rfl:  .  TURMERIC PO, Take by mouth., Disp: , Rfl:  .  Vitamin D, Cholecalciferol, 25 MCG (1000 UT) CAPS, Take 1,000 Units by mouth 2 (two) times daily., Disp: , Rfl:  .  zolpidem (AMBIEN) 5 MG tablet, Take 2.5 mg by mouth at bedtime as needed for sleep. Takes 0.5 tablet, Disp: , Rfl:  .  doxycycline (VIBRA-TABS) 100 MG tablet, Take 1 tablet (100 mg total) by mouth 2 (two) times daily., Disp: 10 tablet, Rfl: 0 .  Fexofenadine HCl (ALLEGRA ALLERGY PO), Take 1 tablet by mouth as needed (for itching)., Disp: , Rfl:  .  fluticasone (FLONASE) 50 MCG/ACT nasal spray, Place 2 sprays into both nostrils daily., Disp: 16 g, Rfl: 2 .  predniSONE (DELTASONE) 10 MG tablet, Take 4tabsx2days,  2tabsx2days, 1tabx2days, 0.5tabx2days, then stop, Disp: 15 tablet, Rfl: 0      Objective:   Vitals:   06/10/18 1454  BP: 118/70  Pulse: 80  SpO2: 98%  Weight: 140 lb 9.6 oz (63.8 kg)  Height: 5\' 4"  (1.626 m)    Estimated body mass index is 24.13 kg/m as calculated from the following:   Height as of this encounter: 5\' 4"  (1.626 m).   Weight as of this encounter: 140 lb 9.6 oz (63.8 kg).  @WEIGHTCHANGE @  [employer]   06/10/18 1454  Weight: 140 lb 9.6 oz (63.8 kg)     Physical Exam  General Appearance:    Alert, cooperative, no distress, appears stated age - yes , Deconditioned looking - no , OBESE  - no, Sitting on Wheelchair -  no  Head:    Normocephalic, without obvious abnormality, atraumatic  Eyes:    PERRL, conjunctiva/corneas clear,  Ears:    Normal TM's and external ear canals, both ears  Nose:   Nares normal, septum midline, mucosa normal,  YES SINUS drainage    or sinus tenderness. OXYGEN ON  - no . Patient is @ ra   Throat:   Lips, mucosa, and tongue normal; teeth and gums normal. Cyanosis on lips - no  Neck:   Supple, symmetrical, trachea midline, no adenopathy;    thyroid:  no enlargement/tenderness/nodules; no carotid   bruit or JVD  Back:     Symmetric, no curvature, ROM normal, no CVA tenderness  Lungs:     Distress - no , Wheeze no, Barrell Chest - no, Purse lip breathing - no, Crackles - no   Chest Wall:    No tenderness or deformity.    Heart:    Regular rate and rhythm, S1 and S2 normal, no rub   or gallop, Murmur - no  Breast Exam:    NOT DONE  Abdomen:     Soft, non-tender, bowel sounds active all four quadrants,    no masses, no organomegaly. Visceral obesity - no  Genitalia:   NOT DONE  Rectal:   NOT DONE  Extremities:   Extremities - normal, Has Cane - no, Clubbing - no, Edema - no  Pulses:   2+ and symmetric all extremities  Skin:   Stigmata of Connective Tissue Disease - no  Lymph nodes:   Cervical, supraclavicular, and axillary nodes  normal  Psychiatric:  Neurologic:   Pleasant - yes, Anxious - mild, Flat affect - no  CAm-ICU - neg, Alert and Oriented x 3 - yes, Moves all 4s - yes, Speech - normal, Cognition - intact           Assessment:       ICD-10-CM   1. Asthma with acute exacerbation, unspecified asthma severity, unspecified whether persistent J45.901 Pulmonary function test  2. Sinus drainage J34.89   3. Lung nodule < 6cm on CT R91.1   4. Solitary pulmonary nodule R91.1 CT Chest Wo Contrast       Plan:     Patient Instructions  Asthma with acute exacerbation, unspecified asthma severity, unspecified whether persistent  - Take prednisone 40 mg daily x 2 days, then 20mg  daily x 2 days, then 10mg  daily x 2 days, then 5mg  daily x 2 days and stop - Take doxycycline 100mg  po twice daily x 5 days; take after meals and avoid sunlight - do spirometry/dlco for followup in June 2020 - continue pulmicort as before with albuterol as needed   Sinus drainage - treat as above but in a week from now if sinus drainage still persists - start  take generic fluticasone inhaler 2 squirts each nostril daily (take prescription)   Lung nodule < 6cm on CT - do ct scan chest without contrast in June 2020  Followup  - after spirometry/dlco and CT in June 2020; return sooner if needed      SIGNATURE    Dr. Brand Males, M.D., F.C.C.P,  Pulmonary and Critical Care Medicine Staff Physician, [facility] Director - Interstitial Lung Disease  Program  [facility] [place] [facility] [place], [place], [address]  Pager: [phone], If no answer or between  15:00h - 7:00h: call [phone] Telephone: [phone]  4:00 PM 06/10/2018

## 2018-07-23 ENCOUNTER — Other Ambulatory Visit: Payer: Self-pay | Admitting: Cardiology

## 2018-08-11 IMAGING — CT CT CHEST HIGH RESOLUTION W/O CM
2 of 5 series · 14 of 36 positions shown, 17 images · non-contrast
Comparison: No priors.

CLINICAL DATA: 72-year-old female with shortness of breath and
cough. Recurrent bronchitis since May 2017.

EXAM:
CT CHEST WITHOUT CONTRAST
TECHNIQUE: Multidetector CT imaging of the chest was performed following the
standard protocol without intravenous contrast. High resolution
imaging of the lungs, as well as inspiratory and expiratory imaging,
was performed.

[Series 3: thorax · axial · 0.60mm/px · z∈[-350,-74]mm · 11 of 166 slices shown, 14 images]
[im 14/166  mediastinal]
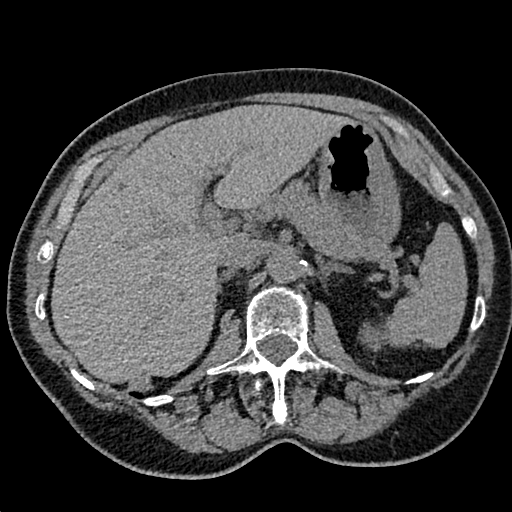
[im 14/166  lung]
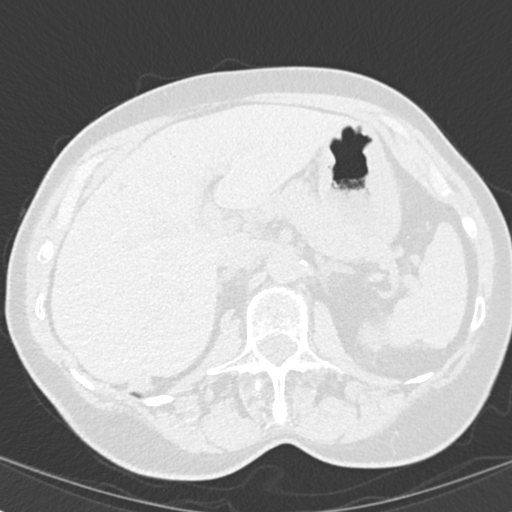
[im 28/166  lung]
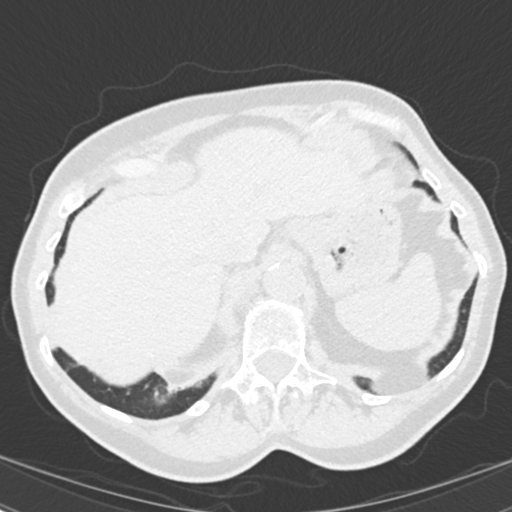
[im 42/166  lung]
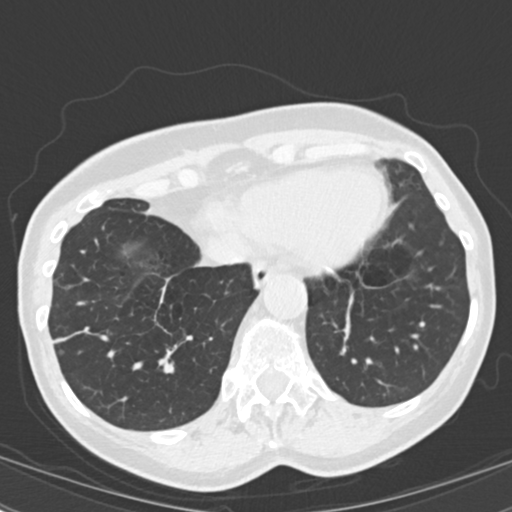
[im 56/166  lung]
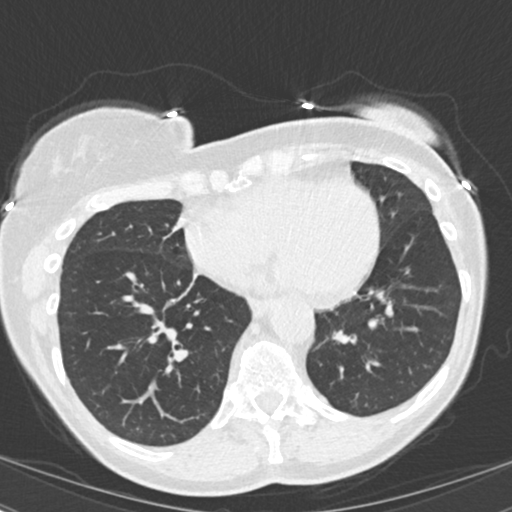
[im 69/166  mediastinal]
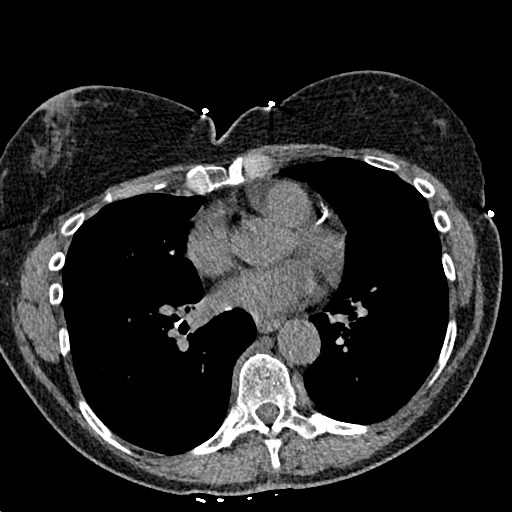
[im 69/166  lung]
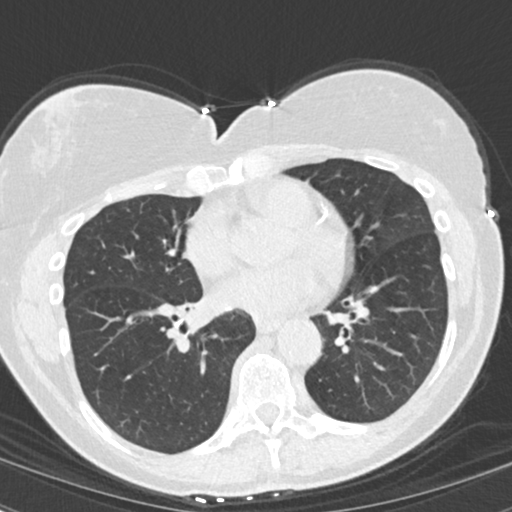
[im 83/166  lung]
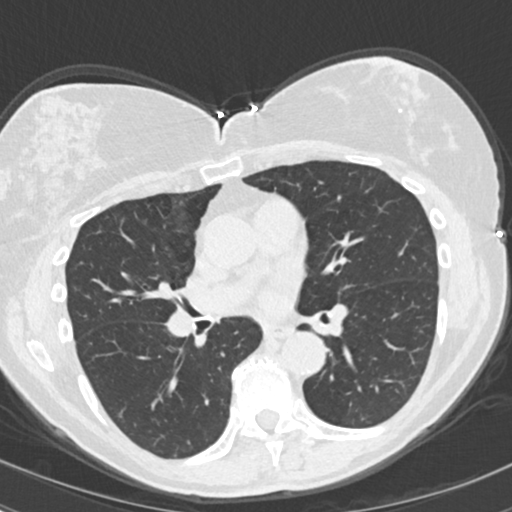
[im 97/166  lung]
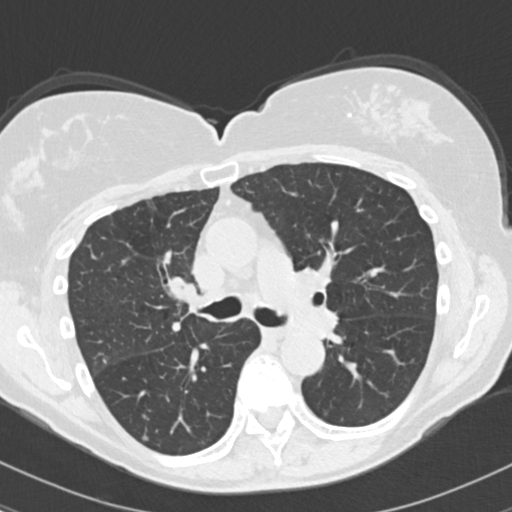
[im 111/166  lung]
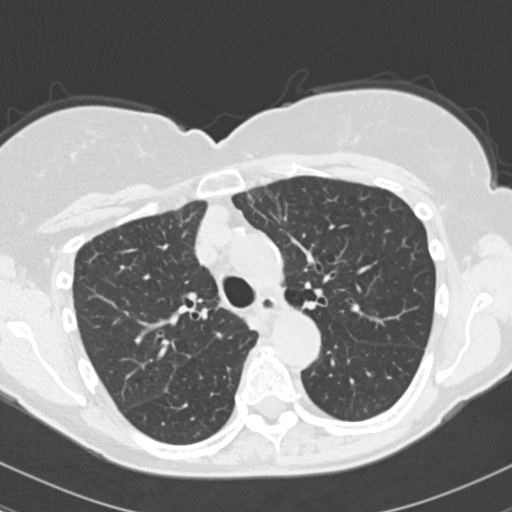
[im 124/166  mediastinal]
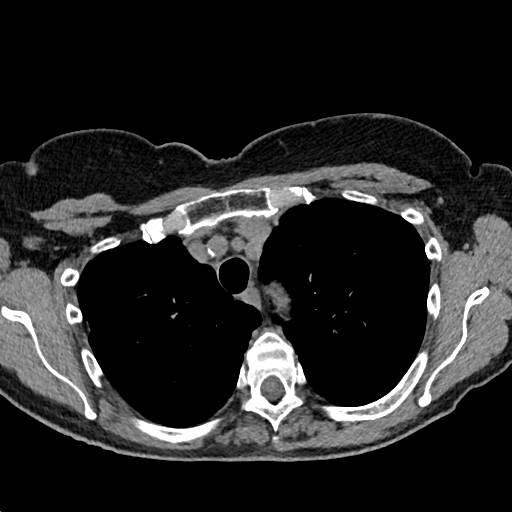
[im 124/166  lung]
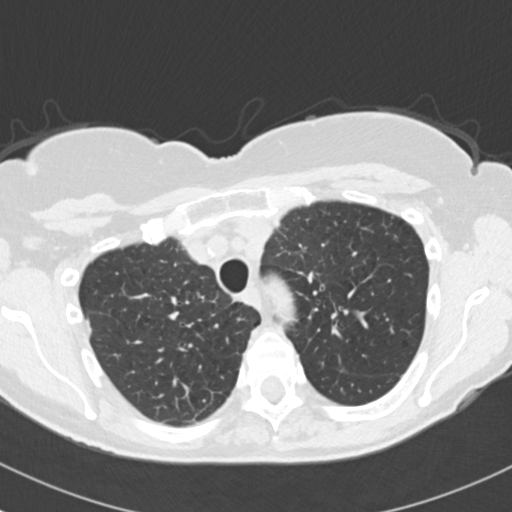
[im 138/166  lung]
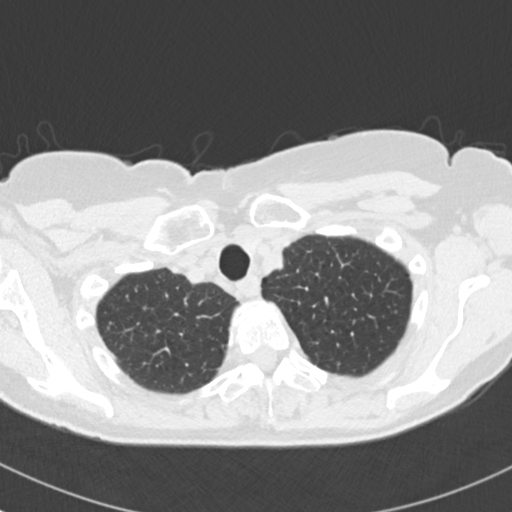
[im 152/166  lung]
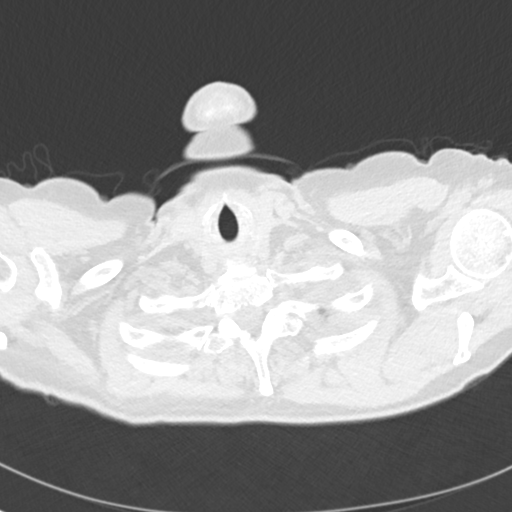

[Series 10: coronal · coronal · 0.59mm/px · 3 of 101 slices shown]
[im 21/101  lung]
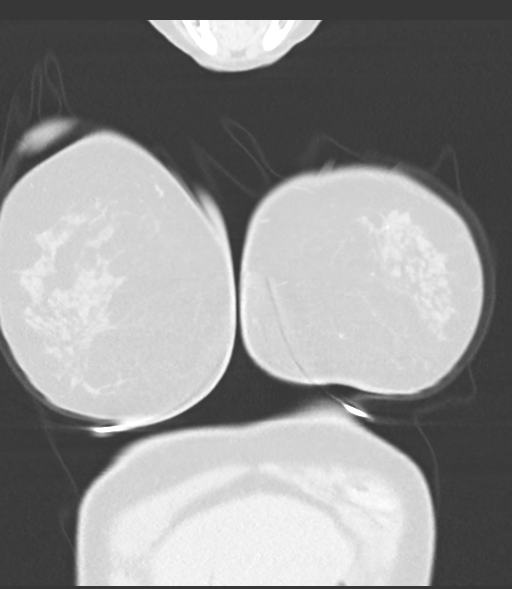
[im 41/101  lung]
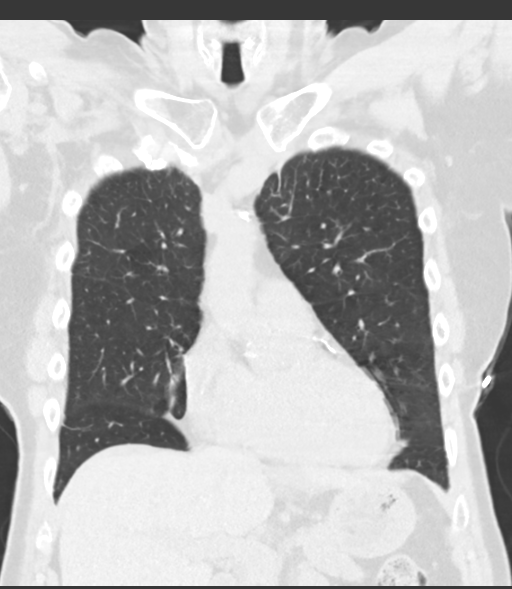
[im 61/101  lung]
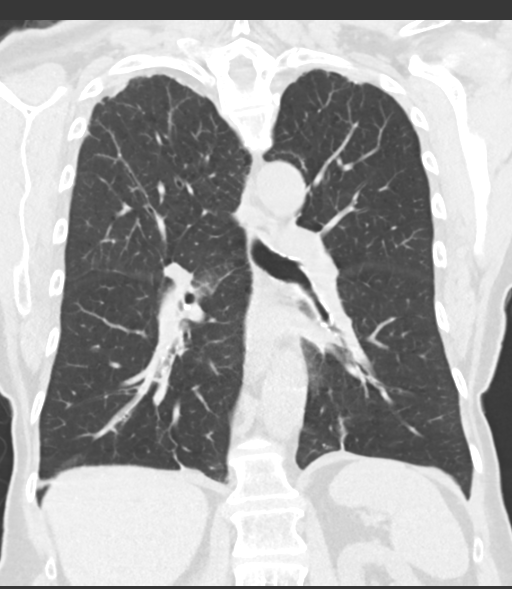

[14 of 36 positions shown; findings below may reference images not displayed]

FINDINGS: Cardiovascular: Heart size is normal. There is no significant
pericardial fluid, thickening or pericardial calcification. There is
aortic atherosclerosis, as well as atherosclerosis of the great
vessels of the mediastinum and the coronary arteries, including
calcified atherosclerotic plaque in the left anterior descending and
right coronary arteries.

Mediastinum/Nodes: No pathologically enlarged mediastinal or hilar
lymph nodes. Please note that accurate exclusion of hilar adenopathy
is limited on noncontrast CT scans. Esophagus is unremarkable in
appearance. No axillary lymphadenopathy.

Lungs/Pleura: High-resolution images demonstrate some areas of
parenchymal banding architectural distortion in the lung bases
bilaterally. Pattern of this is nonspecific, favored to reflect some
chronic post infectious or inflammatory scarring. There are no more
generalized areas of ground-glass attenuation, subpleural
reticulation, traction bronchiectasis or frank honeycombing.
Inspiratory and expiratory imaging demonstrates some mild air
trapping indicative of small airways disease. Diffuse bronchial wall
thickening with mild centrilobular and paraseptal emphysema. No
acute consolidative airspace disease. No pleural effusions. Multiple
small pulmonary nodules are scattered throughout the lungs
bilaterally measuring 2-4 mm in size, with the largest of these in
the left upper lobe measuring 4 mm on axial image 39 of series 7. No
larger more suspicious appearing pulmonary nodules or masses are
noted.

Upper Abdomen: Aortic atherosclerosis.

Musculoskeletal: Orthopedic fixation hardware in the lower cervical
spine. There are no aggressive appearing lytic or blastic lesions
noted in the visualized portions of the skeleton.
IMPRESSION: 1. No definitive evidence to suggest interstitial lung disease.
There are some areas of parenchymal banding in the lung bases
bilaterally which are strongly favored to reflect areas of chronic
post infectious or inflammatory scarring.
2. Mild diffuse bronchial thickening with mild centrilobular and
paraseptal emphysema; imaging findings suggestive of underlying
COPD.
3. Multiple tiny 2-4 mm pulmonary nodules scattered throughout both
lungs, nonspecific, but statistically likely benign. No follow-up
needed if patient is low-risk (and has no known or suspected primary
neoplasm). Non-contrast chest CT can be considered in 12 months if
patient is high-risk. This recommendation follows the consensus
statement: Guidelines for Management of Incidental Pulmonary Nodules
Detected on CT Images: From the [HOSPITAL] 8210; Radiology
8210; [DATE].
4. Aortic atherosclerosis, in addition to 2 vessel coronary artery
disease. Assessment for potential risk factor modification, dietary
therapy or pharmacologic therapy may be warranted, if clinically
indicated.

Aortic Atherosclerosis (S2ILF-2UJ.J) and Emphysema (S2ILF-EZK.J).

## 2018-10-11 ENCOUNTER — Ambulatory Visit: Payer: Self-pay | Admitting: Cardiology

## 2018-11-08 ENCOUNTER — Telehealth: Payer: Self-pay | Admitting: Internal Medicine

## 2018-11-08 MED ORDER — BUDESONIDE 180 MCG/ACT IN AEPB: 2.0000 | INHALATION_SPRAY | Freq: Two times a day (BID) | RESPIRATORY_TRACT | 11 refills | Status: DC

## 2018-11-08 MED ORDER — ALBUTEROL SULFATE HFA 108 (90 BASE) MCG/ACT IN AERS: 2.0000 | INHALATION_SPRAY | Freq: Four times a day (QID) | RESPIRATORY_TRACT | 6 refills | Status: DC | PRN

## 2018-11-08 NOTE — Telephone Encounter (Signed)
Refills on both albuterol and pulmicort inhalers have been sent to pt's preferred pharmacy. Called and spoke with pt letting her know this had been done and pt verbalized understanding. Nothing further needed.

## 2018-11-09 ENCOUNTER — Ambulatory Visit (HOSPITAL_COMMUNITY)
Admission: RE | Admit: 2018-11-09 | Discharge: 2018-11-09 | Disposition: A | Discharge status: Home / Self Care | Payer: Medicare Other | Source: Ambulatory Visit | Attending: Internal Medicine | Admitting: Internal Medicine

## 2018-11-09 ENCOUNTER — Other Ambulatory Visit: Payer: Self-pay

## 2018-11-09 DIAGNOSIS — R911 Solitary pulmonary nodule: Secondary | ICD-10-CM | POA: Diagnosis present

## 2019-05-03 ENCOUNTER — Other Ambulatory Visit: Payer: Self-pay | Admitting: Internal Medicine

## 2019-06-07 ENCOUNTER — Other Ambulatory Visit: Payer: Self-pay | Admitting: Internal Medicine

## 2019-07-17 NOTE — Progress Notes (Signed)
Apologize for delay in results. OVerall CT findings are reassuring  IMPRESSION: Small subpleural pulmonary nodules bilaterally, measuring 2-4 mm, benign. Dedicated follow-up imaging is not required per Fleischner Society guidelines. This recommendation follows the consensus statement: Guidelines for Management of Small Pulmonary Nodules Detected on CT Images: From the Fleischner Society 2017; Radiology 2017; 284:228-243.  Aortic Atherosclerosis (ICD10-I70.0) and Emphysema (ICD10-J43.9).   Electronically Signed   By: Julian Hy M.D.   On: 11/09/2018 14:47

## 2019-12-14 ENCOUNTER — Other Ambulatory Visit: Payer: Self-pay | Admitting: Internal Medicine

## 2019-12-14 DIAGNOSIS — Z1231 Encounter for screening mammogram for malignant neoplasm of breast: Secondary | ICD-10-CM

## 2019-12-21 ENCOUNTER — Other Ambulatory Visit: Payer: Self-pay | Admitting: Internal Medicine

## 2019-12-23 ENCOUNTER — Other Ambulatory Visit: Payer: Self-pay

## 2019-12-23 ENCOUNTER — Ambulatory Visit
Admission: RE | Admit: 2019-12-23 | Discharge: 2019-12-23 | Disposition: A | Discharge status: Home / Self Care | Payer: Medicare PPO | Source: Ambulatory Visit | Attending: Internal Medicine | Admitting: Internal Medicine

## 2019-12-23 DIAGNOSIS — Z1231 Encounter for screening mammogram for malignant neoplasm of breast: Secondary | ICD-10-CM

## 2019-12-28 ENCOUNTER — Other Ambulatory Visit: Payer: Self-pay | Admitting: Internal Medicine

## 2019-12-28 DIAGNOSIS — R928 Other abnormal and inconclusive findings on diagnostic imaging of breast: Secondary | ICD-10-CM

## 2020-01-10 ENCOUNTER — Ambulatory Visit
Admission: RE | Admit: 2020-01-10 | Discharge: 2020-01-10 | Disposition: A | Discharge status: Home / Self Care | Payer: Medicare PPO | Source: Ambulatory Visit | Attending: Internal Medicine | Admitting: Internal Medicine

## 2020-01-10 ENCOUNTER — Other Ambulatory Visit: Payer: Self-pay

## 2020-01-10 ENCOUNTER — Other Ambulatory Visit: Payer: Self-pay | Admitting: Internal Medicine

## 2020-01-10 DIAGNOSIS — R928 Other abnormal and inconclusive findings on diagnostic imaging of breast: Secondary | ICD-10-CM

## 2020-01-10 DIAGNOSIS — N6489 Other specified disorders of breast: Secondary | ICD-10-CM

## 2020-01-20 ENCOUNTER — Ambulatory Visit
Admission: RE | Admit: 2020-01-20 | Discharge: 2020-01-20 | Disposition: A | Discharge status: Home / Self Care | Payer: Medicare PPO | Source: Ambulatory Visit | Attending: Internal Medicine | Admitting: Internal Medicine

## 2020-01-20 ENCOUNTER — Other Ambulatory Visit: Payer: Self-pay

## 2020-01-20 DIAGNOSIS — N6489 Other specified disorders of breast: Secondary | ICD-10-CM

## 2020-01-30 ENCOUNTER — Other Ambulatory Visit: Payer: Self-pay | Admitting: General Surgery

## 2020-01-30 DIAGNOSIS — Z170 Estrogen receptor positive status [ER+]: Secondary | ICD-10-CM

## 2020-01-31 ENCOUNTER — Other Ambulatory Visit: Payer: Self-pay | Admitting: General Surgery

## 2020-01-31 DIAGNOSIS — Z170 Estrogen receptor positive status [ER+]: Secondary | ICD-10-CM

## 2020-02-10 ENCOUNTER — Encounter (HOSPITAL_COMMUNITY): Payer: Self-pay

## 2020-02-10 NOTE — Progress Notes (Signed)
I have attempted to place an introductory phone call to this patient. Unable to reach the patient at this time. I have left a VM with my contact information. I will plan to meet with this patient during her initial visit with Dr. Delton Coombes.

## 2020-02-14 ENCOUNTER — Encounter (HOSPITAL_COMMUNITY): Payer: Self-pay

## 2020-02-14 ENCOUNTER — Other Ambulatory Visit: Payer: Self-pay

## 2020-02-15 ENCOUNTER — Inpatient Hospital Stay (HOSPITAL_COMMUNITY): Payer: Medicare PPO | Attending: Hematology | Admitting: Hematology

## 2020-02-15 DIAGNOSIS — Z79899 Other long term (current) drug therapy: Secondary | ICD-10-CM | POA: Diagnosis not present

## 2020-02-15 DIAGNOSIS — Z170 Estrogen receptor positive status [ER+]: Secondary | ICD-10-CM | POA: Diagnosis not present

## 2020-02-15 DIAGNOSIS — Z803 Family history of malignant neoplasm of breast: Secondary | ICD-10-CM | POA: Insufficient documentation

## 2020-02-15 DIAGNOSIS — M1990 Unspecified osteoarthritis, unspecified site: Secondary | ICD-10-CM | POA: Diagnosis not present

## 2020-02-15 DIAGNOSIS — Z7982 Long term (current) use of aspirin: Secondary | ICD-10-CM | POA: Diagnosis not present

## 2020-02-15 DIAGNOSIS — C50412 Malignant neoplasm of upper-outer quadrant of left female breast: Secondary | ICD-10-CM | POA: Insufficient documentation

## 2020-02-15 DIAGNOSIS — K219 Gastro-esophageal reflux disease without esophagitis: Secondary | ICD-10-CM | POA: Insufficient documentation

## 2020-02-15 DIAGNOSIS — F329 Major depressive disorder, single episode, unspecified: Secondary | ICD-10-CM | POA: Insufficient documentation

## 2020-02-15 DIAGNOSIS — Z87891 Personal history of nicotine dependence: Secondary | ICD-10-CM | POA: Insufficient documentation

## 2020-02-15 DIAGNOSIS — C50512 Malignant neoplasm of lower-outer quadrant of left female breast: Secondary | ICD-10-CM | POA: Diagnosis not present

## 2020-02-15 NOTE — Progress Notes (Signed)
[facility] [address], [place] [address]   Patient Care Team: Josetta Huddle, MD as PCP - General (Internal Medicine) Dishmon, Garwin Brothers, RN as Oncology Nurse Navigator (Oncology)  CHIEF COMPLAINTS/PURPOSE OF CONSULTATION:  Newly diagnosed left breast cancer  HISTORY OF PRESENTING ILLNESS:  Dominique Cobb 74 y.o. female is here because of recent diagnosis of left breast cancer. She was referred by Dr. Rolm Bookbinder from [facility]. She is scheduled for a left breast lumpectomy with left axillary SLNB on 10/7 with Dr. Donne Hazel.  Today she is accompanied by her partner, [name]. She reports that she had a screening mammogram, but did not feel anything on palpation due to her breasts being so lumpy. She had 2 previous breast biopsies, 20 years ago and over 50 years ago, but both were negative. She has been noticing a very small amount of discharge coming from the left breast since she had the biopsy done on 8/20.  Her paternal great aunt had breast cancer late in life. She used to work as a Pharmacist, hospital. She quit smoking in 2006 after peaking at 1-2 PPD for 43 years.  I reviewed her records extensively and collaborated the history with the patient.  SUMMARY OF ONCOLOGIC HISTORY: Oncology History   No history exists.    In terms of breast cancer risk profile:  She menarched at early age of 40 and went to menopause at age 49.  She had 0 pregnancy.  She has received birth control pills for approximately 2 years.  She was never exposed to fertility medications or hormone replacement therapy.  She has positive family history of Breast/GYN/GI cancer.  MEDICAL HISTORY:  Past Medical History:  Diagnosis Date  . Arthritis   . Asthma   . Depression   . Dysphagia   . GERD (gastroesophageal reflux disease)     SURGICAL HISTORY: Past Surgical History:  Procedure Laterality Date  . BREAST CYST ASPIRATION Left   . BREAST EXCISIONAL BIOPSY Left    . BREAST SURGERY     benign.  25 yrs ago  . CARPAL TUNNEL RELEASE  1980's   bilateral   . CATARACT EXTRACTION W/PHACO Left 06/12/2016   Procedure: CATARACT EXTRACTION PHACO AND INTRAOCULAR LENS PLACEMENT LEFT EYE ZRA0.76;  Surgeon: Tonny Branch, MD;  Location: [facility];  Service: Ophthalmology;  Laterality: Left;  left  . CATARACT EXTRACTION W/PHACO Right 07/24/2016   Procedure: CATARACT EXTRACTION PHACO AND INTRAOCULAR LENS PLACEMENT (IOC);  Surgeon: Tonny Branch, MD;  Location: [facility];  Service: Ophthalmology;  Laterality: Right;  right cde 8.29  . COLONOSCOPY N/A 10/29/2012   Procedure: COLONOSCOPY;  Surgeon: Rogene Houston, MD;  Location: [facility] SUITE;  Service: Endoscopy;  Laterality: N/A;  140  . Dental implants    . FINGER SURGERY     trigger finger 3rd  bilaterally  . laproscopy     over 20 yrs ago  . NECK SURGERY     2007  . TONSILLECTOMY      SOCIAL HISTORY: Social History   Socioeconomic History  . Marital status: Divorced    Spouse name: Not on file  . Number of children: Not on file  . Years of education: Not on file  . Highest education level: Not on file  Occupational History  . Occupation: retired  Tobacco Use  . Smoking status: Former Smoker    Packs/day: 2.00    Years: 40.00    Pack years: 80.00    Types:  Cigarettes    Quit date: 09/04/2004    Years since quitting: 15.4  . Smokeless tobacco: Never Used  Substance and Sexual Activity  . Alcohol use: Yes    Comment: occasionally+  . Drug use: No  . Sexual activity: Yes  Other Topics Concern  . Not on file  Social History Narrative  . Not on file   Social Determinants of Health   Financial Resource Strain:   . Difficulty of Paying Living Expenses: Not on file  Food Insecurity:   . Worried About Charity fundraiser in the Last Year: Not on file  . Ran Out of Food in the Last Year: Not on file  Transportation Needs:   . Lack of Transportation (Medical): Not on file  . Lack of Transportation  (Non-Medical): Not on file  Physical Activity:   . Days of Exercise per Week: Not on file  . Minutes of Exercise per Session: Not on file  Stress:   . Feeling of Stress : Not on file  Social Connections:   . Frequency of Communication with Friends and Family: Not on file  . Frequency of Social Gatherings with Friends and Family: Not on file  . Attends Religious Services: Not on file  . Active Member of Clubs or Organizations: Not on file  . Attends Archivist Meetings: Not on file  . Marital Status: Not on file  Intimate Partner Violence:   . Fear of Current or Ex-Partner: Not on file  . Emotionally Abused: Not on file  . Physically Abused: Not on file  . Sexually Abused: Not on file    FAMILY HISTORY: Family History  Problem Relation Age of Onset  . Alcoholism Mother   . Depression Mother   . Arthritis Father   . Thyroid disease Father   . Heart attack Paternal Uncle   . Hypertension Maternal Grandmother   . Dementia Maternal Grandmother   . Heart attack Maternal Grandfather   . Arthritis Maternal Grandfather   . Hip fracture Paternal Grandmother     ALLERGIES:  is allergic to codeine.  MEDICATIONS:  Current Outpatient Medications  Medication Sig Dispense Refill  . albuterol (VENTOLIN HFA) 108 (90 Base) MCG/ACT inhaler Inhale 2 puffs into the lungs every 6 (six) hours as needed for wheezing or shortness of breath. (Patient not taking: Reported on 02/14/2020) 1 Inhaler 6  . ASPIRIN 81 PO Take 1 tablet by mouth daily. (Patient not taking: Reported on 02/14/2020)    . BIOTIN PO Take by mouth.    Marland Kitchen buPROPion (WELLBUTRIN SR) 200 MG 12 hr tablet Take 1 tablet by mouth 2 (two) times daily.    . cycloSPORINE (RESTASIS) 0.05 % ophthalmic emulsion     . escitalopram (LEXAPRO) 10 MG tablet Take 5 mg by mouth 2 (two) times daily. Takes 0.5 tablet    . fluticasone (FLONASE) 50 MCG/ACT nasal spray Place 2 sprays into both nostrils daily. (Patient not taking: Reported on  02/14/2020) 16 g 2  . ibuprofen (ADVIL,MOTRIN) 200 MG tablet Take 200 mg by mouth every 6 (six) hours as needed. (Patient not taking: Reported on 02/14/2020)    . Menaquinone-7 (VITAMIN K2) 100 MCG CAPS     . Omega-3 Fatty Acids (FISH OIL) 1200 MG CAPS Take 2 capsules by mouth daily.    Marland Kitchen OVER THE COUNTER MEDICATION Apply 1 application topically daily as needed (dry skin). Water and glycerin topical solution mixed 2:1    . Propylene Glycol (SYSTANE BALANCE OP) Apply  2 drops to eye at bedtime as needed (dry eyes).     Marland Kitchen PULMICORT FLEXHALER 180 MCG/ACT inhaler INHALE TWO PUFFS TWICE DAILY 1 each 3  . rosuvastatin (CRESTOR) 10 MG tablet TAKE 1 TABLET BY MOUTH IN THE EVENING AFTER DINNER 90 tablet 0  . TURMERIC PO Take by mouth.    . Vitamin D, Cholecalciferol, 25 MCG (1000 UT) CAPS Take 1,000 Units by mouth 2 (two) times daily.    Marland Kitchen zolpidem (AMBIEN) 5 MG tablet Take 2.5 mg by mouth at bedtime as needed for sleep. Takes 0.5 tablet     No current facility-administered medications for this visit.    REVIEW OF SYSTEMS:   Review of Systems  Constitutional: Negative for appetite change and fatigue.  Respiratory: Positive for shortness of breath (d/t asthma).   Gastrointestinal: Positive for constipation and diarrhea.  Psychiatric/Behavioral: Positive for depression (chronic). The patient is nervous/anxious.   All other systems reviewed and are negative.   PHYSICAL EXAMINATION: ECOG PERFORMANCE STATUS: 1 - Symptomatic but completely ambulatory  Vitals:   02/15/20 0811  BP: (!) 156/75  Pulse: 72  Resp: 18  Temp: (!) 97.3 F (36.3 C)  SpO2: 95%   Filed Weights   02/15/20 0811  Weight: 135 lb 6.4 oz (61.4 kg)   Physical Exam Vitals reviewed.  Constitutional:      Appearance: Normal appearance.  Cardiovascular:     Rate and Rhythm: Normal rate and regular rhythm.     Pulses: Normal pulses.     Heart sounds: Normal heart sounds.  Pulmonary:     Effort: Pulmonary effort is normal.      Breath sounds: Normal breath sounds.  Chest:     Breasts:        Right: Normal. No mass or nipple discharge.        Left: Mass (1 cm hard free-mobile mass at 3 o'clock) present. No nipple discharge.  Abdominal:     Palpations: Abdomen is soft. There is no hepatomegaly, splenomegaly or mass.     Tenderness: There is no abdominal tenderness.     Hernia: No hernia is present.  Lymphadenopathy:     Cervical: No cervical adenopathy.     Upper Body:     Right upper body: No supraclavicular, axillary or pectoral adenopathy.     Left upper body: No supraclavicular, axillary or pectoral adenopathy.     Lower Body: No right inguinal adenopathy. No left inguinal adenopathy.  Neurological:     General: No focal deficit present.     Mental Status: She is alert and oriented to person, place, and time.  Psychiatric:        Mood and Affect: Mood normal.        Behavior: Behavior normal.      LABORATORY DATA:  I have reviewed the data as listed No results found for this or any previous visit (from the past 2160 hour(s)).  Surgical pathology from 01/20/2020: Left breast needle core biopsy: invasive ductal carcinoma and ductal carcinoma in situ; ER/PR positive, HER-2 negative, Ki67 10%.  RADIOGRAPHIC STUDIES: I have personally reviewed the radiological reports and agreed with the findings in the report. MM CLIP PLACEMENT LEFT  Result Date: 01/20/2020 CLINICAL DATA:  Status post ultrasound-guided core biopsy of a left mass/distortion. EXAM: 3D DIAGNOSTIC LEFT MAMMOGRAM POST ULTRASOUND BIOPSY COMPARISON:  Previous exam(s). FINDINGS: 3D Mammographic images were obtained following ultrasound guided biopsy of mass/distortion in the 3 o'clock region of the left breast. The biopsy marking clip  is at the posterior margin the distortion in the 3 o'clock region of the left breast. IMPRESSION: The ribbon shaped is at the posterior margin the distortion in the 3 o'clock region of the left breast. Final  Assessment: Post Procedure Mammograms for Marker Placement Electronically Signed   By: Lillia Mountain M.D.   On: 01/20/2020 12:04   [place] LT BREAST BX W LOC DEV 1ST LESION IMG BX SPEC US GUIDE  Addendum Date: 01/30/2020   ADDENDUM REPORT: 01/24/2020 10:11 ADDENDUM: Pathology revealed GRADE II INVASIVE DUCTAL CARCINOMA. DUCTAL CARCINOMA IN SITU of the LEFT breast, 3 o'clock. This was found to be concordant by Dr. Lillia Mountain. Pathology results were discussed with the patient by telephone. The patient reported doing well after the biopsy with tenderness at the site. Post biopsy instructions and care were reviewed and questions were answered. The patient was encouraged to call [facility] for any additional concerns. Surgical consultation has been arranged with Dr. Rolm Bookbinder at [facility] on January 30, 2020. Pathology results reported by Stacie Acres RN on 01/24/2020. Electronically Signed   By: Lillia Mountain M.D.   On: 01/24/2020 10:11   Result Date: 01/30/2020 CLINICAL DATA:  Left breast mass and distortion. EXAM: ULTRASOUND GUIDED LEFT BREAST CORE NEEDLE BIOPSY COMPARISON:  Previous exam(s). FINDINGS: I met with the patient and we discussed the procedure of ultrasound-guided biopsy, including benefits and alternatives. We discussed the high likelihood of a successful procedure. We discussed the risks of the procedure, including infection, bleeding, tissue injury, clip migration, and inadequate sampling. Informed written consent was given. The usual time-out protocol was performed immediately prior to the procedure. Using sterile technique and 1% lidocaine and 1% lidocaine with epinephrine as local anesthetic, under direct ultrasound visualization, a 14 gauge spring-loaded device was used to perform biopsy of a mass in the 3 o'clock region of the left breast using an inferior to superior approach. Prior to the biopsy of ribbon shaped clip was placed in the distortion.  Follow up 2 view mammogram was performed and dictated separately. IMPRESSION: Ultrasound guided biopsy of the left breast. No apparent complications. Electronically Signed: By: Lillia Mountain M.D. On: 01/20/2020 11:44     ASSESSMENT:  1.  Stage I (T1CN0) left breast IDC: -Screening mammogram on 12/23/2019 indicated BI-RADS 0 with possible left breast mass. -Left breast ultrasound and mammogram with additional views on 01/10/2020 shows 1.6 x 1.1 x 1.2 cm mass in the left breast at 3 o'clock position.  No left axillary adenopathy. -Biopsy on 01/20/2020 shows grade 2 IDC, ER 95%, PR 75% positive, Ki-67 10% and HER-2/neu 1+ by IHC. -She had lumpectomy x2 in the left breast more than 20 years ago which were benign.  2.  Social/family history: -She is retired Pharmacist, hospital.  She quit smoking in 2006.  Smoked for 40+ pack years. -Paternal great aunt had breast cancer.  No other malignancies.    PLAN:  1.  Stage I (T1CN0) left breast IDC: -Patient seen at the request of Dr. Donne Hazel. -Left breast lump palpable between 3 and 4:00, measuring 1.5 cm, freely mobile.  No clearly palpable adenopathy.  No nipple discharge at the time of exam. -We discussed the normal treatment plan for early stage breast cancer with surgical resection followed by chemotherapy if needed, radiation therapy and antiestrogen therapy. -She reported slight discharge from the left nipple for the past few days. -She will proceed with lumpectomy and SLNB scheduled on 03/08/2020 by Dr. Donne Hazel. -I  will see her back in 2 to 3 weeks after surgery.  We will discuss the need for Oncotype DX at that time based on pathology.  2.  Bone health: -Last DEXA scan was on 11/06/2014 with a T score of -3.1. -I do not see any bisphosphonates on her medication list.  I will order DEXA scan at next visit.   All questions were answered. The patient knows to call the clinic with any problems, questions or concerns.   Derek Jack, MD 02/15/20  8:23 AM  [place] [phone]   I, Milinda Antis, am acting as a scribe for Dr. Sanda Linger.  I, Derek Jack MD, have reviewed the above documentation for accuracy and completeness, and I agree with the above.

## 2020-02-15 NOTE — Patient Instructions (Addendum)
[place] at [facility] Discharge Instructions  You were seen today by Dr. Delton Coombes. He is a Futures trader.  He talked with you about your family history, your personal history and the events that led you here today.  He discussed your upcoming surgery with Dr. Donne Hazel on 10/7.  He discussed the need for radiation after surgery and then you will need to be on an antiestrogen pill for 5 years.  You need this pill because it blocks the estrogen receptors found on your cancer cells.  Even though your ovaries are not working, there are other organs that produce estrogen in your body and this is what is feeding your cancer.  We will see you back about 2-3 weeks after surgery.     Thank you for choosing [place] at [facility] to provide your oncology and hematology care.  To afford each patient quality time with our provider, please arrive at least 15 minutes before your scheduled appointment time.   If you have a lab appointment with the [facility] please come in thru the Main Entrance and check in at the main information desk.  You need to re-schedule your appointment should you arrive 10 or more minutes late.  We strive to give you quality time with our providers, and arriving late affects you and other patients whose appointments are after yours.  Also, if you no show three or more times for appointments you may be dismissed from the clinic at the providers discretion.     Again, thank you for choosing [facility].  Our hope is that these requests will decrease the amount of time that you wait before being seen by our physicians.       _____________________________________________________________  Should you have questions after your visit to [facility], please contact our office at [phone] and follow the prompts.  Our office hours are 8:00 a.m. and 4:30 p.m. Monday - Friday.  Please note that voicemails left  after 4:00 p.m. may not be returned until the following business day.  We are closed weekends and major holidays.  You do have access to a nurse 24-7, just call the main number to the clinic [phone] and do not press any options, hold on the line and a nurse will answer the phone.    For prescription refill requests, have your pharmacy contact our office and allow 72 hours.    Due to Covid, you will need to wear a mask upon entering the hospital. If you do not have a mask, a mask will be given to you at the Main Entrance upon arrival. For doctor visits, patients may have 1 support person age 61 or older with them. For treatment visits, patients can not have anyone with them due to social distancing guidelines and our immunocompromised population.

## 2020-02-21 ENCOUNTER — Other Ambulatory Visit: Payer: Self-pay

## 2020-02-21 ENCOUNTER — Ambulatory Visit: Payer: Medicare PPO | Attending: General Surgery | Admitting: Physical Therapy

## 2020-02-21 ENCOUNTER — Encounter: Payer: Self-pay | Admitting: Physical Therapy

## 2020-02-21 DIAGNOSIS — C50412 Malignant neoplasm of upper-outer quadrant of left female breast: Secondary | ICD-10-CM | POA: Diagnosis present

## 2020-02-21 DIAGNOSIS — Z170 Estrogen receptor positive status [ER+]: Secondary | ICD-10-CM | POA: Diagnosis present

## 2020-02-21 DIAGNOSIS — R293 Abnormal posture: Secondary | ICD-10-CM

## 2020-02-21 NOTE — Therapy (Signed)
[place], [place], [address] Phone: [phone]   Fax:  [phone]  Physical Therapy Evaluation  Patient Details  Name: Dominique Cobb MRN: [id number] Date of Birth: May 15, 1946 Referring Provider (PT): Donne Hazel   Encounter Date: 02/21/2020   PT End of Session - 02/21/20 1352    Visit Number 1    Number of Visits 2    Date for PT Re-Evaluation 04/17/20    PT Start Time 9528    PT Stop Time 1345    PT Time Calculation (min) 39 min    Activity Tolerance Patient tolerated treatment well    Behavior During Therapy [facility] for tasks assessed/performed           Past Medical History:  Diagnosis Date   Arthritis    Asthma    Depression    Dysphagia    GERD (gastroesophageal reflux disease)     Past Surgical History:  Procedure Laterality Date   BREAST CYST ASPIRATION Left    BREAST EXCISIONAL BIOPSY Left    BREAST SURGERY     benign.  25 yrs ago   [place]  1980's   bilateral    CATARACT EXTRACTION W/PHACO Left 06/12/2016   Procedure: CATARACT EXTRACTION PHACO AND INTRAOCULAR LENS PLACEMENT LEFT EYE UXL2.44;  Surgeon: Tonny Branch, MD;  Location: [facility] ORS;  Service: Ophthalmology;  Laterality: Left;  left   CATARACT EXTRACTION W/PHACO Right 07/24/2016   Procedure: CATARACT EXTRACTION PHACO AND INTRAOCULAR LENS PLACEMENT (IOC);  Surgeon: Tonny Branch, MD;  Location: [facility] ORS;  Service: Ophthalmology;  Laterality: Right;  right cde 8.29   COLONOSCOPY N/A 10/29/2012   Procedure: COLONOSCOPY;  Surgeon: Rogene Houston, MD;  Location: [facility] ENDO SUITE;  Service: Endoscopy;  Laterality: N/A;  140   Dental implants     FINGER SURGERY     trigger finger 3rd  bilaterally   laproscopy     over 20 yrs ago   NECK SURGERY     2007   TONSILLECTOMY      There were no vitals filed for this visit.    Subjective Assessment - 02/21/20 1309    Subjective My right shoulder is an  issue. I had surgery for the rotator cuff some years back. I have a slightly torn bicep muscle on the right and it might snap apart someday. My left shoulder is fine.    Pertinent History L breast cancer, L breast lumpectomy and SLNB scheduled for 10/7 ER/PR+/HER2-, previous L breast lumpectomy with lymph node biopsy that turned out to not be cancer    Patient Stated Goals to gain info from provider    Currently in Pain? No/denies    Pain Score 0-No pain              [facility] PT Assessment - 02/21/20 0001      Assessment   Medical Diagnosis left breast cancer    Referring Provider (PT) Donne Hazel    Onset Date/Surgical Date 03/08/29    Hand Dominance Right    Prior Therapy none recently      Precautions   Precautions Other (comment)    Precaution Comments active cancer      Restrictions   Weight Bearing Restrictions No      Balance Screen   Has the patient fallen in the past 6 months No    Has the patient had a decrease in activity level because of a fear of falling?  No  Is the patient reluctant to leave their home because of a fear of falling?  No      Home Ecologist residence    Living Arrangements Spouse/significant other    Available Help at Discharge Family    Type of [place]      Prior Function   Level of [place] Retired    Leisure walks a couple of times a week 20 min      Cognition   Overall Cognitive Status Within Functional Limits for tasks assessed      Posture/Postural Control   Posture/Postural Control Postural limitations    Postural Limitations Rounded Shoulders;Forward head      ROM / Strength   AROM / PROM / Strength AROM      AROM   AROM Assessment Site Shoulder    Right/Left Shoulder Right;Left    Right Shoulder Flexion 150 Degrees    Right Shoulder ABduction 154 Degrees    Right Shoulder Internal Rotation 59 Degrees    Right Shoulder External Rotation 74 Degrees    Left  Shoulder Flexion 165 Degrees    Left Shoulder ABduction 179 Degrees    Left Shoulder Internal Rotation 58 Degrees    Left Shoulder External Rotation 90 Degrees             LYMPHEDEMA/ONCOLOGY QUESTIONNAIRE - 02/21/20 0001      Type   Cancer Type left breast cancer      Surgeries   Lumpectomy Date 03/08/20    Sentinel Lymph Node Biopsy Date 03/08/20      Lymphedema Assessments   Lymphedema Assessments Upper extremities      Right Upper Extremity Lymphedema   15 cm Proximal to Olecranon Process 26.3 cm    Olecranon Process 21.5 cm    15 cm Proximal to Ulnar Styloid Process 22.5 cm    Just Proximal to Ulnar Styloid Process 14.5 cm    Across Hand at [employer] 18 cm    At [facility] of 2nd Digit 5.5 cm      Left Upper Extremity Lymphedema   15 cm Proximal to Olecranon Process 26.3 cm    Olecranon Process 22.5 cm    15 cm Proximal to Ulnar Styloid Process 22.4 cm    Just Proximal to Ulnar Styloid Process 14.5 cm    Across Hand at [employer] 18 cm    At [place] of 2nd Digit 5.5 cm           L-DEX FLOWSHEETS - 02/21/20 1300      L-DEX LYMPHEDEMA SCREENING   Measurement Type Unilateral    L-DEX MEASUREMENT EXTREMITY Upper Extremity    POSITION  Standing    DOMINANT SIDE Right    At Risk Side Left    BASELINE SCORE (UNILATERAL) 2.2              The patient was assessed using the L-Dex machine today to produce a lymphedema index baseline score. The patient will be reassessed on a regular basis (typically every 3 months) to obtain new L-Dex scores. If the score is > 6.5 points away from his/her baseline score indicating onset of subclinical lymphedema, it will be recommended to wear a compression garment for 4 weeks, 12 hours per day and then be reassessed. If the score continues to be > 6.5 points from baseline at reassessment, we will initiate lymphedema treatment. Assessing in this manner has a 95% rate of preventing  clinically significant  lymphedema.      Objective measurements completed on examination: See above findings.               PT Education - 02/21/20 1346    Education Details Educated pt about signs and symptoms of lymphedema as well as anatomy and physiology of lymphatic system. Educated her in importance of staying as active as possible throughout treatment to decreas fatigue and in head and neck ROM exercises to decrease loss of ROM    Person(s) Educated Patient    Methods Explanation;Handout    Comprehension Verbalized understanding               PT Long Term Goals - 02/21/20 1354      PT LONG TERM GOAL #1   Title Pt will return to baseline ROM measurements to allow pt to return to PLOF.    Time 8    Period Weeks    Status New    Target Date 04/17/20           Breast Clinic Goals - 02/21/20 1354      Patient will be able to verbalize understanding of pertinent lymphedema risk reduction practices relevant to her diagnosis specifically related to skin care.   Status Achieved      Patient will be able to return demonstrate and/or verbalize understanding of the post-op home exercise program related to regaining shoulder range of motion.   Status Achieved      Patient will be able to verbalize understanding of the importance of attending the postoperative After Breast Cancer Class for further lymphedema risk reduction education and therapeutic exercise.   Status Achieved                 Plan - 02/21/20 1352    Clinical Impression Statement Pt was diagnosed with L breast cancer ER/PR +, HER2-. Plan is for her to undergo a L breast lumpectomy and SLNB on 03/08/20. She may require radiation. She has had a previous L breast biopsy with lymph node removal but it turned out to not be cancerous. Pt was instructed in post op exercises and educated that if she has a mastectomy then not to begin exercises until a week after the last drain is removed. Baseline ROM and SOZO measurements  taken today. She will benefit from a post op PT reassessment to determine needs and in every 3 months for additional L dex screening to detect subclinical lymphedema.    Personal Factors and Comorbidities Comorbidity 1    Comorbidities previous L breast biopsy with nodes biopsied but was not cancer, hx of R rotator cuff surgery    Stability/Clinical Decision Making Stable/Uncomplicated    Clinical Decision Making Low    Rehab Potential Good    PT Frequency --   eval and 1 f/u visit   PT Duration 8 weeks    PT Treatment/Interventions ADLs/Self Care Home Management;Therapeutic exercise;Patient/family education    PT Next Visit Plan reassess baselines    PT Home Exercise Plan post op breast exercises    Consulted and Agree with Plan of Care Patient          Patient will follow up at outpatient cancer rehab 3-4 weeks following surgery.  If the patient requires physical therapy at that time, a specific plan will be dictated and sent to the referring physician for approval. The patient was educated today on appropriate basic range of motion exercises to begin post operatively and the importance of attending  the After Breast Cancer class following surgery.  Patient was educated today on lymphedema risk reduction practices as it pertains to recommendations that will benefit the patient immediately following surgery.  She verbalized good understanding.    Patient will benefit from skilled therapeutic intervention in order to improve the following deficits and impairments:  Postural dysfunction, Decreased knowledge of precautions  Visit Diagnosis: Abnormal posture  Malignant neoplasm of upper-outer quadrant of left breast in female, estrogen receptor positive [facility])     Problem List Patient Active Problem List   Diagnosis Date Noted   Breast cancer of lower-outer quadrant of left female breast ([place]) 02/15/2020   Change in stool 10/15/2012   Dysphagia 03/20/2011   GERD (gastroesophageal  reflux disease) 03/20/2011   Depression 03/20/2011    Allyson Sabal [facility] 02/21/2020, 1:55 PM  [place], [place], [address] Phone: [phone]   Fax:  [phone]  Name: Dominique Cobb MRN: [id number] Date of Birth: [birth year]   Manus Gunning, PT 02/21/20 1:55 PM

## 2020-02-21 NOTE — Patient Instructions (Signed)
° ° °          [facility]         [address], [place] [address]         [phone]         Annia Friendly, PT, CLT   After Breast Cancer Class It is recommended you attend the ABC class to be educated on lymphedema risk reduction. This class is free of charge and lasts for 1 hour. It is a 1-time class.      Post op appointment:     Home exercise Program    Follow up PT: It is recommended you return every 3 months for the first 3 years following surgery to be assessed on the SOZO machine for an L-Dex score. This helps prevent clinically significant lymphedema in 95% of patients. These follow up screens are 15 minute appointments that you are not billed for.

## 2020-03-02 ENCOUNTER — Other Ambulatory Visit (HOSPITAL_COMMUNITY): Payer: Medicare PPO

## 2020-03-05 ENCOUNTER — Other Ambulatory Visit (HOSPITAL_COMMUNITY)
Admission: RE | Admit: 2020-03-05 | Discharge: 2020-03-05 | Disposition: A | Discharge status: Home / Self Care | Payer: Medicare PPO | Source: Ambulatory Visit | Attending: General Surgery | Admitting: General Surgery

## 2020-03-05 DIAGNOSIS — Z01812 Encounter for preprocedural laboratory examination: Secondary | ICD-10-CM | POA: Diagnosis present

## 2020-03-05 DIAGNOSIS — Z20822 Contact with and (suspected) exposure to covid-19: Secondary | ICD-10-CM | POA: Insufficient documentation

## 2020-03-05 LAB — SARS CORONAVIRUS 2 (TAT 6-24 HRS): SARS Coronavirus 2: NEGATIVE

## 2020-03-05 NOTE — Pre-Procedure Instructions (Signed)
Dominique Cobb  03/05/2020      [facility] # [place], [place] [address] Phone: [phone] Fax: [phone]  [place], [place] [address] [place] [address] Phone: [phone] Fax: [phone]    Your procedure is scheduled on Oct.7  Report to [facility] cone Entrance A at 5:30 A.M.  Call this number if you have problems the morning of surgery:  [phone]   Remember:  Do not eat  after midnight.  You may drink clear liquids until 4:30 A.M..  Clear liquids allowed are:                    Water, Juice (non-citric and without pulp - diabetics please choose diet or no sugar options), Carbonated beverages - (diabetics please choose diet or no sugar options), Clear Tea, Black Coffee only (no creamer, milk or cream including half and half), Plain Jell-O only (diabetics please choose diet or no sugar options), Gatorade (diabetics please choose diet or no sugar options) and Plain Popsicles only                Please complete your PRE-SURGERY ENSURE that was provided to you by 4:30 A.M. the morning of surgery.  Please, if able, drink it in one setting. DO NOT SIP.    Take these medicines the morning of surgery with A SIP OF WATER :              Albuterol inhaler if needed--bring to hospital             Bupropion (wellbutrion)             restasis             escitalopram (lexapro)       7 days prior to surgery STOP taking any Aspirin (unless otherwise instructed by your surgeon), Aleve, Naproxen, Ibuprofen, Motrin, Advil, Goody's, BC's, all herbal medications, fish oil, and all vitamins.             pulmicort-bring to hospital    Do not wear jewelry, make-up or nail polish.  Do not wear lotions, powders, or perfumes, or deodorant.  Do not shave 48 hours prior to surgery.  Men may shave face and neck.  Do not bring valuables to the hospital.  [facility] is not  responsible for any belongings or valuables.  Contacts, dentures or bridgework may not be worn into surgery.  Leave your suitcase in the car.  After surgery it may be brought to your room.  For patients admitted to the hospital, discharge time will be determined by your treatment team.  Patients discharged the day of surgery will not be allowed to drive home.    Special instructions:   [place]- Preparing For Surgery  Before surgery, you can play an important role. Because skin is not sterile, your skin needs to be as free of germs as possible. You can reduce the number of germs on your skin by washing with CHG (chlorahexidine gluconate) Soap before surgery.  CHG is an antiseptic cleaner which kills germs and bonds with the skin to continue killing germs even after washing.    Oral Hygiene is also important to reduce your risk of infection.  Remember - BRUSH YOUR TEETH THE MORNING OF SURGERY WITH YOUR REGULAR TOOTHPASTE  Please do not use if you have an allergy to  CHG or antibacterial soaps. If your skin becomes reddened/irritated stop using the CHG.  Do not shave (including legs and underarms) for at least 48 hours prior to first CHG shower. It is OK to shave your face.  Please follow these instructions carefully.   1. Shower the NIGHT BEFORE SURGERY and the MORNING OF SURGERY with CHG.   2. If you chose to wash your hair, wash your hair first as usual with your normal shampoo.  3. After you shampoo, rinse your hair and body thoroughly to remove the shampoo.  4. Use CHG as you would any other liquid soap. You can apply CHG directly to the skin and wash gently with a scrungie or a clean washcloth.   5. Apply the CHG Soap to your body ONLY FROM THE NECK DOWN.  Do not use on open wounds or open sores. Avoid contact with your eyes, ears, mouth and genitals (private parts). Wash Face and genitals (private parts)  with your normal soap.  6. Wash thoroughly, paying special attention to  the area where your surgery will be performed.  7. Thoroughly rinse your body with warm water from the neck down.  8. DO NOT shower/wash with your normal soap after using and rinsing off the CHG Soap.  9. Pat yourself dry with a CLEAN TOWEL.  10. Wear CLEAN PAJAMAS to bed the night before surgery, wear comfortable clothes the morning of surgery  11. Place CLEAN SHEETS on your bed the night of your first shower and DO NOT SLEEP WITH PETS.    Day of Surgery:  Do not apply any deodorants/lotions.  Please wear clean clothes to the hospital/surgery center.   Remember to brush your teeth WITH YOUR REGULAR TOOTHPASTE.    Please read over the following fact sheets that you were given.

## 2020-03-06 ENCOUNTER — Encounter (HOSPITAL_COMMUNITY)
Admission: RE | Admit: 2020-03-06 | Discharge: 2020-03-06 | Disposition: A | Discharge status: Home / Self Care | Payer: Medicare PPO | Source: Ambulatory Visit | Attending: General Surgery | Admitting: General Surgery

## 2020-03-06 ENCOUNTER — Other Ambulatory Visit: Payer: Self-pay

## 2020-03-06 ENCOUNTER — Ambulatory Visit (HOSPITAL_COMMUNITY): Payer: Medicare PPO

## 2020-03-06 ENCOUNTER — Encounter (HOSPITAL_COMMUNITY): Payer: Self-pay

## 2020-03-06 DIAGNOSIS — I700 Atherosclerosis of aorta: Secondary | ICD-10-CM | POA: Diagnosis not present

## 2020-03-06 DIAGNOSIS — Z87891 Personal history of nicotine dependence: Secondary | ICD-10-CM | POA: Insufficient documentation

## 2020-03-06 DIAGNOSIS — Z01818 Encounter for other preprocedural examination: Secondary | ICD-10-CM | POA: Insufficient documentation

## 2020-03-06 DIAGNOSIS — C50912 Malignant neoplasm of unspecified site of left female breast: Secondary | ICD-10-CM | POA: Diagnosis not present

## 2020-03-06 DIAGNOSIS — R0600 Dyspnea, unspecified: Secondary | ICD-10-CM | POA: Diagnosis not present

## 2020-03-06 DIAGNOSIS — K219 Gastro-esophageal reflux disease without esophagitis: Secondary | ICD-10-CM | POA: Insufficient documentation

## 2020-03-06 DIAGNOSIS — J439 Emphysema, unspecified: Secondary | ICD-10-CM | POA: Insufficient documentation

## 2020-03-06 DIAGNOSIS — Z7984 Long term (current) use of oral hypoglycemic drugs: Secondary | ICD-10-CM | POA: Diagnosis not present

## 2020-03-06 DIAGNOSIS — Z79899 Other long term (current) drug therapy: Secondary | ICD-10-CM | POA: Diagnosis not present

## 2020-03-06 DIAGNOSIS — F32A Depression, unspecified: Secondary | ICD-10-CM | POA: Insufficient documentation

## 2020-03-06 DIAGNOSIS — Z7951 Long term (current) use of inhaled steroids: Secondary | ICD-10-CM | POA: Insufficient documentation

## 2020-03-06 DIAGNOSIS — J45909 Unspecified asthma, uncomplicated: Secondary | ICD-10-CM | POA: Diagnosis not present

## 2020-03-06 DIAGNOSIS — F419 Anxiety disorder, unspecified: Secondary | ICD-10-CM | POA: Diagnosis not present

## 2020-03-06 HISTORY — DX: Atherosclerosis of aorta: I70.0

## 2020-03-06 HISTORY — DX: Family history of other specified conditions: Z84.89

## 2020-03-06 HISTORY — DX: Dyspnea, unspecified: R06.00

## 2020-03-06 HISTORY — DX: Anxiety disorder, unspecified: F41.9

## 2020-03-06 LAB — CBC
HCT: 45.9 % (ref 36.0–46.0)
Hemoglobin: 14.5 g/dL (ref 12.0–15.0)
MCH: 31.6 pg (ref 26.0–34.0)
MCHC: 31.6 g/dL (ref 30.0–36.0)
MCV: 100 fL (ref 80.0–100.0)
Platelets: 209 10*3/uL (ref 150–400)
RBC: 4.59 MIL/uL (ref 3.87–5.11)
RDW: 12.1 % (ref 11.5–15.5)
WBC: 5.6 10*3/uL (ref 4.0–10.5)
nRBC: 0 % (ref 0.0–0.2)

## 2020-03-06 LAB — BASIC METABOLIC PANEL
Anion gap: 10 (ref 5–15)
BUN: 17 mg/dL (ref 8–23)
CO2: 29 mmol/L (ref 22–32)
Calcium: 9.7 mg/dL (ref 8.9–10.3)
Chloride: 103 mmol/L (ref 98–111)
Creatinine, Ser: 0.67 mg/dL (ref 0.44–1.00)
GFR calc non Af Amer: >60 mL/min (ref >60)
Glucose, Bld: 91 mg/dL (ref 70–99)
Potassium: 4.1 mmol/L (ref 3.5–5.1)
Sodium: 142 mmol/L (ref 135–145)

## 2020-03-06 NOTE — Progress Notes (Signed)
PCP - Architectural technologist -  Ganji--been several yrs. Pulm: Ramaswamy    Chest x-ray -  na EKG -  03/06/20 Stress Test -  Several yrs. ago ECHO -  na Cardiac Cath - na  Sleep Study - na   Fasting Blood Sugar -  na   Blood Thinner Instructions: na Aspirin Instructions: na  ERAS Protcol -yes PRE-SURGERY Ensure given  COVID TEST- 03/05/20   Anesthesia review: cardiac hx.  Patient denies shortness of breath, fever, cough and chest pain at PAT appointment   All instructions explained to the patient, with a verbal understanding of the material. Patient agrees to go over the instructions while at home for a better understanding. Patient also instructed to self quarantine after being tested for COVID-19. The opportunity to ask questions was provided.

## 2020-03-06 NOTE — Progress Notes (Addendum)
Anesthesia PAT Evaluation:  Case: [id number] Date/Time: 03/08/20 0715   Procedure: LEFT BREAST LUMPECTOMY WITH RADIOACTIVE SEED AND LEFT AXILLARY SENTINEL LYMPH NODE BIOPSY (Left Breast) - PEC BLOCK   Anesthesia type: General   Pre-op diagnosis: LEFT BREAST CANCER   Location: [place] OR ROOM 02 / [place] OR   Surgeons: Rolm Bookbinder, MD      DISCUSSION: Patient is a 74 year old female scheduled for the above procedure.  History includes former smoker (quit 09/04/04), left breast cancer (01/20/20), asthma, GERD, depression, anxiety, dysphagia, exertional dyspnea, aortic atherosclerosis, dental implants (bilateral), neck surgery.   Reported cardiology evaluation with normal stress test a few years ago for finding of aortic and coronary calcifications on chest imaging. Records requested from [facility].  She denied chest pain, edema, syncope, palpitations, SOB at rest. [place] walking [place] the property without difficulty. She does reports stable DOE when climbing 2 flights of stairs. She feels her asthma is controlled (has not needed rescue inhaler). She is compliant with Pulmicort.   Preoperative COVID-19 test was negative on 03/05/20. She is getting RSL on 03/07/20 1:15 PM  ADDENDUM 03/07/20 10:26 AM: Records received from [facility]. Last visit 04/16/18 with Jeri Lager, FNP-C. She had been seen a few months earlier for coronary calcifications on CT and dyspnea. No chest pain. ETT and echo had been ordered, but due to an injury she had only had the echo at that point showing LVEF 61% with normal wall motion, grade 1 diastolic dysfunction, mild TR, PASP 26. Dyspnea felt likely related to former tobacco and emphysema (already seeing pulmonology); however, still recommended proceeding with stress test, but given her arm injury ETT changed to [facility] nuclear stress test. Follow-up planned if stress tet abnormal or new/worsening symptoms. Stress  test was done on 05/10/18 and showed normal perfusion but was classified as intermediate risk because her LVEF was 46% although visually normal. Echo recommended to confirm EF, but this had already been done on 04/01/18 which showed EF 61%.    Based on evaluation at PAT, patient without chest pain or SOB at rest. EKG without ST/T wave abnormality. Able to walk up 2 flights of stairs with stable DOE with this level of exertion. No edema or signs of volume overload. No recent asthma exacerbations. She is getting her RSL placed this afternoon. Based on available information, I would anticipate that she can proceed as planned if no acute changes. Also discussed with anesthesiologist Annye Asa, MD.    VS: BP 128/74   Pulse 71   Temp 36.7 C (Oral)   Resp 18   Ht 5\' 4"  (1.626 m)   Wt 62 kg   SpO2 98%   BMI 23.45 kg/m  Provider PPE included N95 and face shield. Patient wore face mask. Heart RRR, no murmur noted. Lungs clear without wheezing. No carotid bruits noted. No ankle edema.  She denied any issues with mouth opening.  She reported posterior dental implants bilateral lower jaw and left upper jaw.   PROVIDERS: Josetta Huddle, MD is PCP  Derek Jack, MD is Deno Etienne, MD is pulmonologist. Last visit 06/10/18.    LABS: Labs reviewed: Acceptable for surgery. (all labs ordered are listed, but only abnormal results are displayed)  Labs Reviewed  BASIC METABOLIC PANEL  CBC    PFTs: FVC 2.81 (107%), post 2.78 (106%). FEV1 2.09 (107%), post 2.14 (109%), FEV1R 74% (99%), post 77%. DLCO unc 19.10 (88%).  Conclusions: Minimal  airway obstruction is present suggesting small airway disease.    IMAGES: CT Chest 11/09/18: IMPRESSION: Small subpleural pulmonary nodules bilaterally, measuring 2-4 mm, benign. Dedicated follow-up imaging is not required per Fleischner Society guidelines. This recommendation follows the consensus statement: Guidelines for Management of Small  Pulmonary Nodules Detected on CT Images: From the Fleischner Society 2017; Radiology 2017; 284:228-243. - Aortic Atherosclerosis (ICD10-I70.0; Atherosclerotic calcifications of the aortic arch. Coronary atherosclerosis of the LAD.) and Emphysema (ICD10-J43.9).   EKG: 03/06/20:  Normal sinus rhythm Possible Left atrial enlargement Borderline ECG   CV: Stress test 05/10/18 [facility]):  Impression: 1.  The patient performed treadmill exercise using Bruce protocol, completing 5:02 minutes.  The patient completed an estimated workload of 7 METS, reaching 103% of the maximum predicted heart rate.  Normal hemodynamic response was seen.  Stress symptoms included dyspnea, dizziness.  Exercise capacity was low.  Stress electrocardiogram showed 1-1.5 horizontal/upsloping ST depressions inferior lateral leads, which are equivocal changes for ischemia. 2.  The overall quality of the study is good.  There is no evidence of abnormal lung activity.  Stress and rest SPECT images demonstrate homogeneous tracer distribution throughout the myocardium.  Gated SPECT imaging reveals normal myocardial thickening and wall motion.  The left ventricular ejection fraction was calculated 46%, although visually appears normal. 3.  Intermediate risk stress due to reduced LV EF.  Recommend correlation with echocardiogram. [EF 61% by 04/01/18 echo]   Echo 04/01/18 [facility]): Conclusions: 1.  Left ventricle cavity is normal in size.  Normal global wall motion.  Doppler evidence of grade 1 (impaired) diastolic dysfunction, normal LAP.  Calculated EF 61%. 2.  Mild tricuspid regurgitation.  Estimated pulmonary artery systolic pressure 26 mmHg.   Past Medical History:  Diagnosis Date  . Anxiety   . Aortic atherosclerosis ([place])   . Arthritis   . Asthma   . Depression   . Dysphagia   . Dyspnea    on exertion  . Family history of adverse reaction to anesthesia    father's lung collapsed  during lumbar surgery  . GERD (gastroesophageal reflux disease)     Past Surgical History:  Procedure Laterality Date  . BREAST CYST ASPIRATION Left   . BREAST EXCISIONAL BIOPSY Left   . BREAST SURGERY     benign.  25 yrs ago  . CARPAL TUNNEL RELEASE  1980's   bilateral   . CATARACT EXTRACTION W/PHACO Left 06/12/2016   Procedure: CATARACT EXTRACTION PHACO AND INTRAOCULAR LENS PLACEMENT LEFT EYE AYT0.16;  Surgeon: Tonny Branch, MD;  Location: [facility];  Service: Ophthalmology;  Laterality: Left;  left  . CATARACT EXTRACTION W/PHACO Right 07/24/2016   Procedure: CATARACT EXTRACTION PHACO AND INTRAOCULAR LENS PLACEMENT (IOC);  Surgeon: Tonny Branch, MD;  Location: [facility] ORS;  Service: Ophthalmology;  Laterality: Right;  right cde 8.29  . COLONOSCOPY N/A 10/29/2012   Procedure: COLONOSCOPY;  Surgeon: Rogene Houston, MD;  Location: [facility];  Service: Endoscopy;  Laterality: N/A;  140  . Dental implants    . FINGER SURGERY     trigger finger 3rd  bilaterally  . laproscopy     over 20 yrs ago  . NECK SURGERY     2007  . TONSILLECTOMY      MEDICATIONS: . albuterol (VENTOLIN HFA) 108 (90 Base) MCG/ACT inhaler  . ASPIRIN 81 PO  . BIOTIN PO  . buPROPion (WELLBUTRIN SR) 200 MG 12 hr tablet  . cycloSPORINE (RESTASIS) 0.05 % ophthalmic emulsion  . escitalopram (LEXAPRO) 10  MG tablet  . fluticasone (FLONASE) 50 MCG/ACT nasal spray  . ibuprofen (ADVIL) 400 MG tablet  . Menaquinone-7 (VITAMIN K2) 100 MCG CAPS  . Multiple Vitamin (MULTIVITAMIN WITH MINERALS) TABS tablet  . Omega-3 Fatty Acids (FISH OIL) 1000 MG CAPS  . OVER THE COUNTER MEDICATION  . Propylene Glycol (SYSTANE BALANCE OP)  . PULMICORT FLEXHALER 180 MCG/ACT inhaler  . rosuvastatin (CRESTOR) 10 MG tablet  . rosuvastatin (CRESTOR) 5 MG tablet  . Turmeric 500 MG CAPS  . Vitamin D, Cholecalciferol, 25 MCG (1000 UT) CAPS  . zolpidem (AMBIEN) 5 MG tablet   No current facility-administered medications for this encounter.  Not  currently taking ASAS, Biotin, Flonase, Vitamin K2, Vitamin D, Crestor 10 mg (taking 5 mg). Using Pulmicort 2 puff BID and albuterol MDI as needed.   Myra Gianotti, PA-C Surgical Short Stay/Anesthesiology [facility] Phone [phone] [facility] Phone [phone] 03/06/2020 4:07 PM

## 2020-03-07 ENCOUNTER — Ambulatory Visit
Admission: RE | Admit: 2020-03-07 | Discharge: 2020-03-07 | Disposition: A | Discharge status: Home / Self Care | Payer: Medicare PPO | Source: Ambulatory Visit | Attending: General Surgery | Admitting: General Surgery

## 2020-03-07 DIAGNOSIS — Z170 Estrogen receptor positive status [ER+]: Secondary | ICD-10-CM

## 2020-03-07 NOTE — Anesthesia Preprocedure Evaluation (Addendum)
Anesthesia Evaluation  Patient identified by MRN, date of birth, ID band Patient awake    Reviewed: Allergy & Precautions, NPO status , Patient's Chart, lab work & pertinent test results  History of Anesthesia Complications Negative for: history of anesthetic complications  Airway Mallampati: I  TM Distance: >3 FB Neck ROM: Full    Dental no notable dental hx. (+) Teeth Intact, Dental Advisory Given   Pulmonary shortness of breath and with exertion, asthma , former smoker,  80 pack year history, quit 2006 Took pulmicort this morning  Last used albuterol last week   Pulmonary exam normal breath sounds clear to auscultation       Cardiovascular Normal cardiovascular exam Rhythm:Regular Rate:Normal      LVEF 61% with normal wall motion, grade 1 diastolic dysfunction, mild TR, PASP 26. Dyspnea felt likely related to former tobacco and emphysema (already seeing pulmonology); however, still recommended proceeding with stress test, but given her arm injury ETT changed to [facility] nuclear stress test. Follow-up planned if stress tet abnormal or new/worsening symptoms. Stress test was done on 05/10/18 and showed normal perfusion but was classified as intermediate risk because her LVEF was 46% although visually normal. Echo recommended to confirm EF, but this had already been done on 04/01/18 which showed EF 61%.     Neuro/Psych PSYCHIATRIC DISORDERS Anxiety Depression negative neurological ROS     GI/Hepatic Neg liver ROS, GERD  Controlled,  Endo/Other  negative endocrine ROS  Renal/GU negative Renal ROS  negative genitourinary   Musculoskeletal  (+) Arthritis , Osteoarthritis,    Abdominal   Peds  Hematology negative hematology ROS (+)   Anesthesia Other Findings L breast ca   Reproductive/Obstetrics negative OB ROS                           Anesthesia Physical Anesthesia Plan  ASA: II  Anesthesia  Plan: General and Regional   Post-op Pain Management: GA combined w/ Regional for post-op pain   Induction: Intravenous  PONV Risk Score and Plan: 3 and Ondansetron, Dexamethasone and Treatment may vary due to age or medical condition  Airway Management Planned: LMA  Additional Equipment: None  Intra-op Plan:   Post-operative Plan: Extubation in OR  Informed Consent: I have reviewed the patients History and Physical, chart, labs and discussed the procedure including the risks, benefits and alternatives for the proposed anesthesia with the patient or authorized representative who has indicated his/her understanding and acceptance.     Dental advisory given  Plan Discussed with: CRNA  Anesthesia Plan Comments: ( )      Anesthesia Quick Evaluation

## 2020-03-08 ENCOUNTER — Encounter (HOSPITAL_COMMUNITY)
Admission: RE | Admit: 2020-03-08 | Discharge: 2020-03-08 | Disposition: A | Discharge status: Home / Self Care | Payer: Medicare PPO | Source: Ambulatory Visit | Attending: General Surgery | Admitting: General Surgery

## 2020-03-08 ENCOUNTER — Ambulatory Visit (HOSPITAL_COMMUNITY): Payer: Medicare PPO | Admitting: Certified Registered Nurse Anesthetist

## 2020-03-08 ENCOUNTER — Ambulatory Visit (HOSPITAL_BASED_OUTPATIENT_CLINIC_OR_DEPARTMENT_OTHER)
Admission: RE | Admit: 2020-03-08 | Discharge: 2020-03-08 | Disposition: A | Discharge status: Home / Self Care | Payer: Medicare PPO | Attending: General Surgery | Admitting: General Surgery

## 2020-03-08 ENCOUNTER — Encounter (HOSPITAL_COMMUNITY)
Admission: RE | Disposition: A | Discharge status: Home / Self Care | Payer: Self-pay | Source: Home / Self Care | Attending: General Surgery

## 2020-03-08 ENCOUNTER — Other Ambulatory Visit: Payer: Self-pay

## 2020-03-08 ENCOUNTER — Ambulatory Visit
Admission: RE | Admit: 2020-03-08 | Discharge: 2020-03-08 | Disposition: A | Discharge status: Home / Self Care | Payer: Medicare PPO | Source: Ambulatory Visit | Attending: General Surgery | Admitting: General Surgery

## 2020-03-08 ENCOUNTER — Ambulatory Visit (HOSPITAL_COMMUNITY): Payer: Medicare PPO | Admitting: Vascular Surgery

## 2020-03-08 ENCOUNTER — Encounter (HOSPITAL_COMMUNITY): Payer: Self-pay | Admitting: General Surgery

## 2020-03-08 DIAGNOSIS — Z818 Family history of other mental and behavioral disorders: Secondary | ICD-10-CM | POA: Insufficient documentation

## 2020-03-08 DIAGNOSIS — F32A Depression, unspecified: Secondary | ICD-10-CM | POA: Diagnosis not present

## 2020-03-08 DIAGNOSIS — Z8261 Family history of arthritis: Secondary | ICD-10-CM | POA: Diagnosis not present

## 2020-03-08 DIAGNOSIS — Z170 Estrogen receptor positive status [ER+]: Secondary | ICD-10-CM | POA: Diagnosis not present

## 2020-03-08 DIAGNOSIS — F419 Anxiety disorder, unspecified: Secondary | ICD-10-CM | POA: Diagnosis not present

## 2020-03-08 DIAGNOSIS — J45909 Unspecified asthma, uncomplicated: Secondary | ICD-10-CM | POA: Diagnosis not present

## 2020-03-08 DIAGNOSIS — C50412 Malignant neoplasm of upper-outer quadrant of left female breast: Secondary | ICD-10-CM

## 2020-03-08 DIAGNOSIS — Z803 Family history of malignant neoplasm of breast: Secondary | ICD-10-CM | POA: Insufficient documentation

## 2020-03-08 DIAGNOSIS — K219 Gastro-esophageal reflux disease without esophagitis: Secondary | ICD-10-CM | POA: Insufficient documentation

## 2020-03-08 DIAGNOSIS — R0602 Shortness of breath: Secondary | ICD-10-CM | POA: Diagnosis not present

## 2020-03-08 DIAGNOSIS — Z8349 Family history of other endocrine, nutritional and metabolic diseases: Secondary | ICD-10-CM | POA: Diagnosis not present

## 2020-03-08 DIAGNOSIS — M1990 Unspecified osteoarthritis, unspecified site: Secondary | ICD-10-CM | POA: Insufficient documentation

## 2020-03-08 DIAGNOSIS — Z8249 Family history of ischemic heart disease and other diseases of the circulatory system: Secondary | ICD-10-CM | POA: Insufficient documentation

## 2020-03-08 DIAGNOSIS — Z833 Family history of diabetes mellitus: Secondary | ICD-10-CM | POA: Diagnosis not present

## 2020-03-08 DIAGNOSIS — C50912 Malignant neoplasm of unspecified site of left female breast: Secondary | ICD-10-CM | POA: Diagnosis present

## 2020-03-08 DIAGNOSIS — J449 Chronic obstructive pulmonary disease, unspecified: Secondary | ICD-10-CM | POA: Diagnosis not present

## 2020-03-08 DIAGNOSIS — M549 Dorsalgia, unspecified: Secondary | ICD-10-CM | POA: Insufficient documentation

## 2020-03-08 DIAGNOSIS — Z87891 Personal history of nicotine dependence: Secondary | ICD-10-CM | POA: Diagnosis not present

## 2020-03-08 DIAGNOSIS — Z86010 Personal history of colonic polyps: Secondary | ICD-10-CM | POA: Insufficient documentation

## 2020-03-08 DIAGNOSIS — Z79899 Other long term (current) drug therapy: Secondary | ICD-10-CM | POA: Diagnosis not present

## 2020-03-08 DIAGNOSIS — G43909 Migraine, unspecified, not intractable, without status migrainosus: Secondary | ICD-10-CM | POA: Insufficient documentation

## 2020-03-08 DIAGNOSIS — Z811 Family history of alcohol abuse and dependence: Secondary | ICD-10-CM | POA: Insufficient documentation

## 2020-03-08 HISTORY — PX: BREAST LUMPECTOMY WITH RADIOACTIVE SEED AND SENTINEL LYMPH NODE BIOPSY: SHX6550

## 2020-03-08 SURGERY — BREAST LUMPECTOMY WITH RADIOACTIVE SEED AND SENTINEL LYMPH NODE BIOPSY
Anesthesia: Regional | Site: Breast | Laterality: Left

## 2020-03-08 MED ORDER — PHENYLEPHRINE HCL-NACL 10-0.9 MG/250ML-% IV SOLN: INTRAVENOUS | Status: DC | PRN

## 2020-03-08 MED ORDER — SODIUM CHLORIDE (PF) 0.9 % IJ SOLN
INTRAMUSCULAR | Status: AC
  Filled 2020-03-08: qty 10

## 2020-03-08 MED ORDER — FENTANYL CITRATE (PF) 100 MCG/2ML IJ SOLN: 25.0000 ug | INTRAMUSCULAR | Status: DC | PRN

## 2020-03-08 MED ORDER — METHYLENE BLUE 0.5 % INJ SOLN
INTRAVENOUS | Status: AC
  Filled 2020-03-08: qty 10

## 2020-03-08 MED ORDER — ACETAMINOPHEN 500 MG PO TABS: 1000.0000 mg | ORAL_TABLET | ORAL | Status: AC

## 2020-03-08 MED ORDER — ENSURE PRE-SURGERY PO LIQD: 296.0000 mL | Freq: Once | ORAL | Status: DC

## 2020-03-08 MED ORDER — GABAPENTIN 100 MG PO CAPS: 100.0000 mg | ORAL_CAPSULE | ORAL | Status: AC

## 2020-03-08 MED ORDER — DEXAMETHASONE SODIUM PHOSPHATE 10 MG/ML IJ SOLN
INTRAMUSCULAR | Status: AC
  Filled 2020-03-08: qty 1

## 2020-03-08 MED ORDER — ONDANSETRON HCL 4 MG/2ML IJ SOLN: 4.0000 mg | Freq: Once | INTRAMUSCULAR | Status: DC | PRN

## 2020-03-08 MED ORDER — ACETAMINOPHEN 500 MG PO TABS
ORAL_TABLET | ORAL | Status: AC
  Administered 2020-03-08: 1000 mg via ORAL
  Filled 2020-03-08: qty 2

## 2020-03-08 MED ORDER — FENTANYL CITRATE (PF) 250 MCG/5ML IJ SOLN
INTRAMUSCULAR | Status: DC | PRN
Start: 2020-03-08 — End: 2020-03-08
  Administered 2020-03-08 (×2): 50 ug via INTRAVENOUS

## 2020-03-08 MED ORDER — PHENYLEPHRINE HCL-NACL 10-0.9 MG/250ML-% IV SOLN
INTRAVENOUS | Status: DC | PRN
  Administered 2020-03-08: 20 ug/min via INTRAVENOUS

## 2020-03-08 MED ORDER — ACETAMINOPHEN 500 MG PO TABS: 1000.0000 mg | ORAL_TABLET | Freq: Once | ORAL | Status: DC

## 2020-03-08 MED ORDER — 0.9 % SODIUM CHLORIDE (POUR BTL) OPTIME
TOPICAL | Status: DC | PRN
  Administered 2020-03-08: 1000 mL

## 2020-03-08 MED ORDER — BUPIVACAINE HCL (PF) 0.5 % IJ SOLN
INTRAMUSCULAR | Status: DC | PRN
  Administered 2020-03-08: 20 mL via PERINEURAL

## 2020-03-08 MED ORDER — BUPIVACAINE HCL (PF) 0.25 % IJ SOLN
INTRAMUSCULAR | Status: AC
  Filled 2020-03-08: qty 30

## 2020-03-08 MED ORDER — CHLORHEXIDINE GLUCONATE 0.12 % MT SOLN: 15.0000 mL | Freq: Once | OROMUCOSAL | Status: AC

## 2020-03-08 MED ORDER — LACTATED RINGERS IV SOLN: INTRAVENOUS | Status: DC

## 2020-03-08 MED ORDER — TECHNETIUM TC 99M SULFUR COLLOID FILTERED
1.0000 | Freq: Once | INTRAVENOUS | Status: AC | PRN
  Administered 2020-03-08: 1 via INTRADERMAL

## 2020-03-08 MED ORDER — ONDANSETRON HCL 4 MG/2ML IJ SOLN
INTRAMUSCULAR | Status: AC
  Filled 2020-03-08: qty 2

## 2020-03-08 MED ORDER — ONDANSETRON HCL 4 MG/2ML IJ SOLN
INTRAMUSCULAR | Status: DC | PRN
  Administered 2020-03-08: 4 mg via INTRAVENOUS

## 2020-03-08 MED ORDER — LIDOCAINE 2% (20 MG/ML) 5 ML SYRINGE
INTRAMUSCULAR | Status: AC
  Filled 2020-03-08: qty 5

## 2020-03-08 MED ORDER — PHENYLEPHRINE 40 MCG/ML (10ML) SYRINGE FOR IV PUSH (FOR BLOOD PRESSURE SUPPORT)
PREFILLED_SYRINGE | INTRAVENOUS | Status: DC | PRN
  Administered 2020-03-08: 80 ug via INTRAVENOUS

## 2020-03-08 MED ORDER — CHLORHEXIDINE GLUCONATE 0.12 % MT SOLN
OROMUCOSAL | Status: AC
  Administered 2020-03-08: 15 mL via OROMUCOSAL
  Filled 2020-03-08: qty 15

## 2020-03-08 MED ORDER — BUPIVACAINE LIPOSOME 1.3 % IJ SUSP
INTRAMUSCULAR | Status: DC | PRN
  Administered 2020-03-08: 10 mL via PERINEURAL

## 2020-03-08 MED ORDER — LIDOCAINE 2% (20 MG/ML) 5 ML SYRINGE
INTRAMUSCULAR | Status: DC | PRN
  Administered 2020-03-08: 40 mg via INTRAVENOUS

## 2020-03-08 MED ORDER — PROPOFOL 10 MG/ML IV BOLUS
INTRAVENOUS | Status: AC
  Filled 2020-03-08: qty 20

## 2020-03-08 MED ORDER — GABAPENTIN 100 MG PO CAPS
ORAL_CAPSULE | ORAL | Status: AC
  Administered 2020-03-08: 100 mg via ORAL
  Filled 2020-03-08: qty 1

## 2020-03-08 MED ORDER — CEFAZOLIN SODIUM-DEXTROSE 2-4 GM/100ML-% IV SOLN
INTRAVENOUS | Status: AC
  Filled 2020-03-08: qty 100

## 2020-03-08 MED ORDER — DEXAMETHASONE SODIUM PHOSPHATE 10 MG/ML IJ SOLN
INTRAMUSCULAR | Status: DC | PRN
  Administered 2020-03-08: 5 mg via INTRAVENOUS

## 2020-03-08 MED ORDER — CEFAZOLIN SODIUM-DEXTROSE 2-4 GM/100ML-% IV SOLN
2.0000 g | INTRAVENOUS | Status: AC
  Administered 2020-03-08: 2 g via INTRAVENOUS

## 2020-03-08 MED ORDER — BUPIVACAINE HCL (PF) 0.25 % IJ SOLN
INTRAMUSCULAR | Status: DC | PRN
  Administered 2020-03-08: 4 mL

## 2020-03-08 MED ORDER — ORAL CARE MOUTH RINSE: 15.0000 mL | Freq: Once | OROMUCOSAL | Status: AC

## 2020-03-08 MED ORDER — FENTANYL CITRATE (PF) 250 MCG/5ML IJ SOLN
INTRAMUSCULAR | Status: AC
  Filled 2020-03-08: qty 5

## 2020-03-08 MED ORDER — HYDROCODONE-ACETAMINOPHEN 10-325 MG PO TABS: 1.0000 | ORAL_TABLET | Freq: Four times a day (QID) | ORAL | 0 refills | Status: AC | PRN

## 2020-03-08 MED ORDER — PROPOFOL 10 MG/ML IV BOLUS
INTRAVENOUS | Status: DC | PRN
  Administered 2020-03-08: 130 mg via INTRAVENOUS

## 2020-03-08 SURGICAL SUPPLY — 46 items
APPLIER CLIP 9.375 MED OPEN (MISCELLANEOUS)
BINDER BREAST LRG (GAUZE/BANDAGES/DRESSINGS) IMPLANT
BINDER BREAST XLRG (GAUZE/BANDAGES/DRESSINGS) IMPLANT
CANISTER SUCT 3000ML PPV (MISCELLANEOUS) ×3 IMPLANT
CHLORAPREP W/TINT 26 (MISCELLANEOUS) ×3 IMPLANT
CLIP APPLIE 9.375 MED OPEN (MISCELLANEOUS) IMPLANT
CLIP VESOCCLUDE MED 6/CT (CLIP) ×6 IMPLANT
CLOSURE STERI-STRIP 1/2X4 (GAUZE/BANDAGES/DRESSINGS) ×1
CLOSURE WOUND 1/2 X4 (GAUZE/BANDAGES/DRESSINGS) ×1
CLSR STERI-STRIP ANTIMIC 1/2X4 (GAUZE/BANDAGES/DRESSINGS) ×2 IMPLANT
COVER PROBE W GEL 5X96 (DRAPES) ×3 IMPLANT
COVER SURGICAL LIGHT HANDLE (MISCELLANEOUS) ×3 IMPLANT
COVER WAND RF STERILE (DRAPES) IMPLANT
DERMABOND ADVANCED (GAUZE/BANDAGES/DRESSINGS) ×2
DERMABOND ADVANCED .7 DNX12 (GAUZE/BANDAGES/DRESSINGS) ×1 IMPLANT
DEVICE DUBIN SPECIMEN MAMMOGRA (MISCELLANEOUS) ×3 IMPLANT
DRAPE CHEST BREAST 15X10 FENES (DRAPES) ×3 IMPLANT
ELECT COATED BLADE 2.86 ST (ELECTRODE) ×3 IMPLANT
ELECT REM PT RETURN 9FT ADLT (ELECTROSURGICAL) ×3
ELECTRODE REM PT RTRN 9FT ADLT (ELECTROSURGICAL) ×1 IMPLANT
GAUZE 4X4 16PLY RFD (DISPOSABLE) ×3 IMPLANT
GLOVE BIO SURGEON STRL SZ7 (GLOVE) ×3 IMPLANT
GLOVE BIOGEL PI IND STRL 7.5 (GLOVE) ×1 IMPLANT
GLOVE BIOGEL PI INDICATOR 7.5 (GLOVE) ×2
GOWN STRL REUS W/ TWL LRG LVL3 (GOWN DISPOSABLE) ×2 IMPLANT
GOWN STRL REUS W/TWL LRG LVL3 (GOWN DISPOSABLE) ×6
ILLUMINATOR WAVEGUIDE N/F (MISCELLANEOUS) IMPLANT
KIT BASIN OR (CUSTOM PROCEDURE TRAY) ×3 IMPLANT
KIT MARKER MARGIN INK (KITS) ×3 IMPLANT
NEEDLE 18GX1X1/2 (RX/OR ONLY) (NEEDLE) IMPLANT
NEEDLE FILTER BLUNT 18X 1/2SAF (NEEDLE)
NEEDLE FILTER BLUNT 18X1 1/2 (NEEDLE) IMPLANT
NEEDLE HYPO 25GX1X1/2 BEV (NEEDLE) ×3 IMPLANT
NS IRRIG 1000ML POUR BTL (IV SOLUTION) ×3 IMPLANT
PACK GENERAL/GYN (CUSTOM PROCEDURE TRAY) ×3 IMPLANT
STRIP CLOSURE SKIN 1/2X4 (GAUZE/BANDAGES/DRESSINGS) ×2 IMPLANT
SUT MNCRL AB 4-0 PS2 18 (SUTURE) ×6 IMPLANT
SUT MON AB 5-0 PS2 18 (SUTURE) IMPLANT
SUT SILK 2 0 SH (SUTURE) IMPLANT
SUT VIC AB 2-0 SH 27 (SUTURE) ×6
SUT VIC AB 2-0 SH 27XBRD (SUTURE) ×2 IMPLANT
SUT VIC AB 3-0 SH 27 (SUTURE) ×6
SUT VIC AB 3-0 SH 27X BRD (SUTURE) ×2 IMPLANT
SYR CONTROL 10ML LL (SYRINGE) ×3 IMPLANT
TOWEL GREEN STERILE (TOWEL DISPOSABLE) ×3 IMPLANT
TOWEL GREEN STERILE FF (TOWEL DISPOSABLE) ×3 IMPLANT

## 2020-03-08 NOTE — Anesthesia Postprocedure Evaluation (Signed)
Anesthesia Post Note  Patient: Dominique Cobb  Procedure(s) Performed: LEFT BREAST LUMPECTOMY WITH RADIOACTIVE SEED AND LEFT AXILLARY SENTINEL LYMPH NODE BIOPSY (Left Breast)     Patient location during evaluation: PACU Anesthesia Type: Regional and General Level of consciousness: awake and alert Pain management: pain level controlled Vital Signs Assessment: post-procedure vital signs reviewed and stable Respiratory status: spontaneous breathing, nonlabored ventilation, respiratory function stable and patient connected to nasal cannula oxygen Cardiovascular status: blood pressure returned to baseline and stable Postop Assessment: no apparent nausea or vomiting Anesthetic complications: no   No complications documented.  Last Vitals:  Vitals:   03/08/20 0538 03/08/20 0834  BP: (!) 153/63 135/67  Pulse: 74 63  Resp: 17 12  Temp: 36.4 C 36.5 C  SpO2: 100% 100%    Last Pain:  Vitals:   03/08/20 0834  TempSrc:   PainSc: 0-No pain                 Pervis Hocking

## 2020-03-08 NOTE — Interval H&P Note (Signed)
History and Physical Interval Note:  03/08/2020 7:07 AM  Dominique Cobb  has presented today for surgery, with the diagnosis of LEFT BREAST CANCER.  The various methods of treatment have been discussed with the patient and family. After consideration of risks, benefits and other options for treatment, the patient has consented to  Procedure(s) with comments: LEFT BREAST LUMPECTOMY WITH RADIOACTIVE SEED AND LEFT AXILLARY SENTINEL LYMPH NODE BIOPSY (Left) - PEC BLOCK as a surgical intervention.  The patient's history has been reviewed, patient examined, no change in status, stable for surgery.  I have reviewed the patient's chart and labs.  Questions were answered to the patient's satisfaction.     Rolm Bookbinder

## 2020-03-08 NOTE — Op Note (Addendum)
Preoperative diagnosis: Clinical stage I left breast cancer Postoperative diagnosis: Same as above Procedure: 1. Left breast radioactive seed guided lumpectomy 2. Left deep axillary sentinel lymph node biopsy Surgeon: Dr. Serita Grammes Anesthesia: General with a pectoral block Estimated blood loss: Minimal Complications: None Drains: None Specimens: 1. Left breast tissue containing seed and clip 2. Left deep axillary sentinel lymph nodes with highest count of 253 3.  Additional margins inferior,medial, and posterior margins marked short superior, long lateral double deep Sponge needle count was correct completion Disposition to recovery stable condition  Indications: 42 yof with prior benign left breast biopsy presents after screening mm found left breast distortion. has fh of bca in elderly paternal great aunt. no mass or dc now. she has c density breasts. on mm there is left breast distortion that on US shows a 16x11x12 mm mass, no ax adenopathy. biopsy was done and is grade II IDC with DCIS that is 95er/75pr, her 2 negative and Ki is 10%. she is here to discuss options. We elected to proceed with lumpectomy and sentinel node biopsy  Procedure: After informed consent was obtained the patient was given antibiotics. She underwent a pectoralblock. She was given technetium in the standard periareolar fashion. She had SCDs in place. She was then placed under general anesthesia without complication. Her right side was then prepped and draped in the standard sterile surgical fashion. A surgical timeout was then performed.  I did the breast cancer first. I identified the radioactive seed in the lower outer quadrant. I infiltrated Marcaine and made a periareolar incision to hide the scar later. I then dissected down to the seed. I then removed the seed and the surrounding tissue with an attempt to get a clear margin. I then passed this off the table. Mammogram confirmed removal  of the seed and the clip. I did remove some additional margins and the posterior margin is the muscle now.  I then obtained hemostasis. Clips were placed in the cavity. I then closed the breast tissue with 2-0 Vicryl. The skin was closed with 3-0 Vicryl and the 5-0 Monocryl for the last layer. Steri-Strips and glue were eventually applied to this.  I then made a incision below the axillary hairline. I carried this through the axillary fascia. I was able to identify what appeared to be several small lymph nodes.I removed the radioactive tissue.   There was no background radioactivity. Hemostasis was obtained. I closed the axillary fascia with 2-0 Vicryl. The skin was closed with 3-0 Vicryl and 4-0 Monocryl. Steri-Strips and dry were placed. Binder was placed.  She was then awakened and transferred to recovery in stable condition.

## 2020-03-08 NOTE — Anesthesia Procedure Notes (Signed)
Procedure Name: LMA Insertion Date/Time: 03/08/2020 7:33 AM Performed by: Bryson Corona, CRNA Pre-anesthesia Checklist: Patient identified, Emergency Drugs available, Suction available and Patient being monitored Patient Re-evaluated:Patient Re-evaluated prior to induction Oxygen Delivery Method: Circle System Utilized Preoxygenation: Pre-oxygenation with 100% oxygen Induction Type: IV induction Ventilation: Mask ventilation without difficulty LMA: LMA inserted LMA Size: 4.0 Number of attempts: 1 Placement Confirmation: positive ETCO2 Tube secured with: Tape Dental Injury: Teeth and Oropharynx as per pre-operative assessment

## 2020-03-08 NOTE — H&P (Signed)
74 yof with prior benign left breast biopsy presents after screening mm found left breast distortion. has fh of bca in elderly paternal great aunt. no mass or dc now. she has c density breasts. on mm there is left breast distortion that on US shows a 16x11x12 mm mass, no ax adenopathy. biopsy was done and is grade II IDC with DCIS that is 95er/75pr, her 2 negative and Ki is 10%. she is here to discuss options she is retired, lives in [place] and is here with significant other.   Past Surgical History  Breast Biopsy  Left. multiple Breast Mass; Local Excision  Left. Cataract Surgery  Bilateral. Colon Polyp Removal - Colonoscopy  Oral Surgery  Shoulder Surgery  Right. Spinal Surgery - Neck  Tonsillectomy   Diagnostic Studies History  Colonoscopy  5-10 years ago Mammogram  within last year  Medication History  Zolpidem Tartrate (10MG  Tablet, Oral) Active. buPROPion HCl ER (SR) (200MG  Tablet ER 12HR, Oral) Active. Rosuvastatin Calcium (5MG  Tablet, Oral) Active. Pulmicort Flexhaler (180MCG/ACT Aero Pow Br Act, Inhalation) Active. Medications Reconciled  Social History  Alcohol use  Moderate alcohol use. Caffeine use  Coffee. Illicit drug use  Remotely quit drug use. Tobacco use  Former smoker.  Family History  Alcohol Abuse  Family Members In General, Mother. Anesthetic complications  Father. Arthritis  Family Members In General, Father. Depression  Mother. Diabetes Mellitus  Family Members In General. Heart Disease  Family Members In General. Heart disease in female family member before age 44  Hypertension  Family Members In General. Thyroid problems  Father.  Pregnancy / Birth History  Age at menarche  55 years. Age of menopause  51-55 Contraceptive History  Oral contraceptives. Gravida  1 Irregular periods  Maternal age  17-30 Para  0  Other Problems Anxiety Disorder  Arthritis  Asthma  Back Pain  Chronic Obstructive  Lung Disease  Depression  Gastroesophageal Reflux Disease  Lump In Breast  Migraine Headache    Review of Systems  General Not Present- Appetite Loss, Chills, Fatigue, Fever, Night Sweats, Weight Gain and Weight Loss. Skin Not Present- Change in Wart/Mole, Dryness, Hives, Jaundice, New Lesions, Non-Healing Wounds, Rash and Ulcer. HEENT Present- Seasonal Allergies and Wears glasses/contact lenses. Not Present- Earache, Hearing Loss, Hoarseness, Nose Bleed, Oral Ulcers, Ringing in the Ears, Sinus Pain, Sore Throat, Visual Disturbances and Yellow Eyes. Respiratory Present- Difficulty Breathing and Snoring. Not Present- Bloody sputum, Chronic Cough and Wheezing. Cardiovascular Present- Difficulty Breathing Lying Down. Not Present- Chest Pain, Leg Cramps, Palpitations, Rapid Heart Rate, Shortness of Breath and Swelling of Extremities. Gastrointestinal Not Present- Abdominal Pain, Bloating, Bloody Stool, Change in Bowel Habits, Chronic diarrhea, Constipation, Difficulty Swallowing, Excessive gas, Gets full quickly at meals, Hemorrhoids, Indigestion, Nausea, Rectal Pain and Vomiting. Female Genitourinary Present- Frequency. Not Present- Nocturia, Painful Urination, Pelvic Pain and Urgency. Musculoskeletal Present- Back Pain, Joint Pain and Joint Stiffness. Not Present- Muscle Pain, Muscle Weakness and Swelling of Extremities. Psychiatric Present- Anxiety and Depression. Not Present- Bipolar, Change in Sleep Pattern, Fearful and Frequent crying. Endocrine Present- Hair Changes. Not Present- Cold Intolerance, Excessive Hunger, Heat Intolerance, Hot flashes and New Diabetes. Hematology Not Present- Blood Thinners, Easy Bruising, Excessive bleeding, Gland problems, HIV and Persistent Infections.  Physical Exam  General Mental Status-Alert. Orientation-Oriented X3. Breast Nipples-No Discharge. Breast Lump-No Palpable Breast Mass. Lymphatic Head & Neck General Head & Neck Lymphatics:  Bilateral - Description - Normal. Axillary General Axillary Region: Bilateral - Description - Normal. Note: no [place] adenopathy  Assessment & Plan  BREAST CANCER OF UPPER-OUTER QUADRANT OF LEFT FEMALE BREAST (C50.412) Story: Left breast seed guided lumpectomy, left ax sn biopsy We discussed the staging and pathophysiology of breast cancer. We discussed all of the different options for treatment for breast cancer including surgery, chemotherapy, radiation therapy, Herceptin, and antiestrogen therapy. We discussed a sentinel lymph node biopsy as she does not appear to having lymph node involvement right now. We discussed the performance of that with injection of radioactive tracer. I think reasonable with grade II 16 mm cancer to do this although she is over 74 and er pos. she is very healthy otherwise We discussed up to a 5% risk lifetime of chronic shoulder pain as well as lymphedema associated with a sentinel lymph node biopsy. We discussed the options for treatment of the breast cancer which included lumpectomy versus a mastectomy. We discussed the performance of the lumpectomy with radioactive seed placement. We discussed a 5-10% chance of a positive margin requiring reexcision in the operating room. We also discussed that she might need radiation therapy if she undergoes lumpectomy. The breast cannot undergo more radiation therapy in the same breast after lumpectomy in the future. We discussed mastectomy and the postoperative care for that as well. Mastectomy can be followed by reconstruction. The decision for lumpectomy vs mastectomy has no impact on decision for chemotherapy. Most mastectomy patients will not need radiation therapy. We discussed that there is no difference in her survival whether she undergoes lumpectomy with radiation therapy or antiestrogen therapy versus a mastectomy. There is also no real difference between her recurrence in the breast. discussed postop oncotype as possibility We  discussed the risks of operation including bleeding, infection, possible reoperation. She understands her further therapy will be based on what her stages at the time of her operation.

## 2020-03-08 NOTE — Transfer of Care (Signed)
Immediate Anesthesia Transfer of Care Note  Patient: Dominique Cobb  Procedure(s) Performed: LEFT BREAST LUMPECTOMY WITH RADIOACTIVE SEED AND LEFT AXILLARY SENTINEL LYMPH NODE BIOPSY (Left Breast)  Patient Location: PACU  Anesthesia Type:General  Level of Consciousness: awake and alert   Airway & Oxygen Therapy: Patient Spontanous Breathing and Patient connected to face mask oxygen  Post-op Assessment: Report given to RN and Post -op Vital signs reviewed and stable  Post vital signs: Reviewed and stable  Last Vitals:  Vitals Value Taken Time  BP 135/67   Temp    Pulse 63 03/08/20 0833  Resp 12 03/08/20 0833  SpO2 100 % 03/08/20 0833  Vitals shown include unvalidated device data.  Last Pain:  Vitals:   03/08/20 0614  TempSrc:   PainSc: 0-No pain      Patients Stated Pain Goal: 6 (63/89/37 [id number])  Complications: No complications documented.

## 2020-03-08 NOTE — Discharge Instructions (Signed)
[facility] Office Phone Number [phone]  BREAST BIOPSY/ PARTIAL MASTECTOMY: POST OP INSTRUCTIONS Take 400 mg of ibuprofen every 8 hours or 650 mg tylenol every 6 hours for next 72 hours then as needed. Use ice several times daily also. Always review your discharge instruction sheet given to you by the facility where your surgery was performed.  IF YOU HAVE DISABILITY OR FAMILY LEAVE FORMS, YOU MUST BRING THEM TO THE OFFICE FOR PROCESSING.  DO NOT GIVE THEM TO YOUR DOCTOR.  1. A prescription for pain medication may be given to you upon discharge.  Take your pain medication as prescribed, if needed.  If narcotic pain medicine is not needed, then you may take acetaminophen (Tylenol), naprosyn (Alleve) or ibuprofen (Advil) as needed. 2. Take your usually prescribed medications unless otherwise directed 3. If you need a refill on your pain medication, please contact your pharmacy.  They will contact our office to request authorization.  Prescriptions will not be filled after 5pm or on week-ends. 4. You should eat very light the first 24 hours after surgery, such as soup, crackers, pudding, etc.  Resume your normal diet the day after surgery. 5. Most patients will experience some swelling and bruising in the breast.  Ice packs and a good support bra will help.  Wear the breast binder provided or a sports bra for 72 hours day and night.  After that wear a sports bra during the day until you return to the office. Swelling and bruising can take several days to resolve.  6. It is common to experience some constipation if taking pain medication after surgery.  Increasing fluid intake and taking a stool softener will usually help or prevent this problem from occurring.  A mild laxative (Milk of Magnesia or Miralax) should be taken according to package directions if there are no bowel movements after 48 hours. 7. Unless discharge instructions indicate otherwise, you may remove your bandages 48  hours after surgery and you may shower at that time.  You may have steri-strips (small skin tapes) in place directly over the incision.  These strips should be left on the skin for 7-10 days and will come off on their own.  If your surgeon used skin glue on the incision, you may shower in 24 hours.  The glue will flake off over the next 2-3 weeks.  Any sutures or staples will be removed at the office during your follow-up visit. 8. ACTIVITIES:  You may resume regular daily activities (gradually increasing) beginning the next day.  Wearing a good support bra or sports bra minimizes pain and swelling.  You may have sexual intercourse when it is comfortable. a. You may drive when you no longer are taking prescription pain medication, you can comfortably wear a seatbelt, and you can safely maneuver your car and apply brakes. b. RETURN TO WORK:  ______________________________________________________________________________________ 9. You should see your doctor in the office for a follow-up appointment approximately two weeks after your surgery.  Your doctor's nurse will typically make your follow-up appointment when she calls you with your pathology report.  Expect your pathology report 3-4 business days after your surgery.  You may call to check if you do not hear from us after three days. 10. OTHER INSTRUCTIONS: _______________________________________________________________________________________________ _____________________________________________________________________________________________________________________________________ _____________________________________________________________________________________________________________________________________ _____________________________________________________________________________________________________________________________________  WHEN TO CALL DR Tameem Pullara: 1. Fever over 101.0 2. Nausea and/or vomiting. 3. Extreme swelling or  bruising. 4. Continued bleeding from incision. 5. Increased pain, redness, or drainage from the incision.  The clinic   staff is available to answer your questions during regular business hours.  Please don't hesitate to call and ask to speak to one of the nurses for clinical concerns.  If you have a medical emergency, go to the nearest emergency room or call 911.  A surgeon from [facility] is always on call at the hospital.  For further questions, please visit [email or web address]  

## 2020-03-08 NOTE — Anesthesia Procedure Notes (Signed)
Anesthesia Regional Block: Pectoralis block   Pre-Anesthetic Checklist: ,, timeout performed, Correct Patient, Correct Site, Correct Laterality, Correct Procedure, Correct Position, site marked, Risks and benefits discussed,  Surgical consent,  Pre-op evaluation,  At surgeon's request and post-op pain management  Laterality: Left  Prep: Maximum Sterile Barrier Precautions used, chloraprep       Needles:  Injection technique: Single-shot  Needle Type: Echogenic Stimulator Needle     Needle Length: 9cm  Needle Gauge: 22     Additional Needles:   Procedures:,,,, ultrasound used (permanent image in chart),,,,  Narrative:  Start time: 03/08/2020 7:05 AM End time: 03/08/2020 7:10 AM Injection made incrementally with aspirations every 5 mL.  Performed by: Personally  Anesthesiologist: Pervis Hocking, DO  Additional Notes: Monitors applied. No increased pain on injection. No increased resistance to injection. Injection made in 5cc increments. Good needle visualization. Patient tolerated procedure well.

## 2020-03-09 ENCOUNTER — Encounter (HOSPITAL_COMMUNITY): Payer: Self-pay | Admitting: General Surgery

## 2020-03-09 LAB — SURGICAL PATHOLOGY

## 2020-03-26 ENCOUNTER — Inpatient Hospital Stay (HOSPITAL_COMMUNITY): Payer: Medicare PPO | Attending: Hematology | Admitting: Hematology

## 2020-03-26 ENCOUNTER — Encounter (HOSPITAL_COMMUNITY): Payer: Self-pay | Admitting: Hematology

## 2020-03-26 ENCOUNTER — Other Ambulatory Visit: Payer: Self-pay

## 2020-03-26 VITALS — BP 133/69 | HR 72 | Temp 97.0°F | Resp 18 | Wt 139.6 lb

## 2020-03-26 DIAGNOSIS — Z79899 Other long term (current) drug therapy: Secondary | ICD-10-CM | POA: Diagnosis not present

## 2020-03-26 DIAGNOSIS — F329 Major depressive disorder, single episode, unspecified: Secondary | ICD-10-CM | POA: Diagnosis not present

## 2020-03-26 DIAGNOSIS — Z7951 Long term (current) use of inhaled steroids: Secondary | ICD-10-CM | POA: Diagnosis not present

## 2020-03-26 DIAGNOSIS — M1990 Unspecified osteoarthritis, unspecified site: Secondary | ICD-10-CM | POA: Insufficient documentation

## 2020-03-26 DIAGNOSIS — C50412 Malignant neoplasm of upper-outer quadrant of left female breast: Secondary | ICD-10-CM | POA: Diagnosis not present

## 2020-03-26 DIAGNOSIS — F419 Anxiety disorder, unspecified: Secondary | ICD-10-CM | POA: Diagnosis not present

## 2020-03-26 DIAGNOSIS — I700 Atherosclerosis of aorta: Secondary | ICD-10-CM | POA: Insufficient documentation

## 2020-03-26 DIAGNOSIS — K219 Gastro-esophageal reflux disease without esophagitis: Secondary | ICD-10-CM | POA: Diagnosis not present

## 2020-03-26 DIAGNOSIS — J449 Chronic obstructive pulmonary disease, unspecified: Secondary | ICD-10-CM | POA: Diagnosis not present

## 2020-03-26 DIAGNOSIS — C50512 Malignant neoplasm of lower-outer quadrant of left female breast: Secondary | ICD-10-CM | POA: Diagnosis not present

## 2020-03-26 DIAGNOSIS — Z170 Estrogen receptor positive status [ER+]: Secondary | ICD-10-CM | POA: Diagnosis not present

## 2020-03-26 DIAGNOSIS — Z87891 Personal history of nicotine dependence: Secondary | ICD-10-CM | POA: Insufficient documentation

## 2020-03-26 NOTE — Progress Notes (Signed)
[place], [place] [address]   CLINIC:  Medical Oncology/Hematology  PCP:  Josetta Huddle, MD [address] / [place] [address] [phone]   REASON FOR VISIT:  Follow-up for left breast cancer  PRIOR THERAPY: Left breast lumpectomy with seeds implants on 03/08/2020  NGS Results: Not done  CURRENT THERAPY: Under work-up  BRIEF ONCOLOGIC HISTORY:  Oncology History   No history exists.    CANCER STAGING: Cancer Staging No matching staging information was found for the patient.  INTERVAL HISTORY:  [name], a 74 y.o. female, returns for routine follow-up of her left breast cancer. Dominique Cobb was last seen on 02/15/2020.   Today she reports feeling well. She tolerated the lumpectomy well and has not needed any Norco or pain meds. She complains of soreness in her left arm and armpit with intermittent numbness. She is taking calcium and vitamin D daily.  She stays active despite her COPD. She has dental implants and has refused to take bisphosphanates.   REVIEW OF SYSTEMS:  Review of Systems  Constitutional: Positive for fatigue (75%). Negative for appetite change.  Respiratory: Positive for shortness of breath (d/t COPD).   Gastrointestinal: Positive for diarrhea (intermittent loose BM).  Musculoskeletal: Positive for arthralgias (L arm & armpit soreness).  Neurological: Positive for numbness (intermittent L arm and hand).  All other systems reviewed and are negative.   PAST MEDICAL/SURGICAL HISTORY:  Past Medical History:  Diagnosis Date  . Anxiety   . Aortic atherosclerosis ([place])   . Arthritis   . Asthma   . Depression   . Dysphagia   . Dyspnea    on exertion  . Family history of adverse reaction to anesthesia    father's lung collapsed during lumbar surgery  . GERD (gastroesophageal reflux disease)    Past Surgical History:  Procedure Laterality Date  . BREAST CYST ASPIRATION Left   . BREAST  EXCISIONAL BIOPSY Left   . BREAST LUMPECTOMY WITH RADIOACTIVE SEED AND SENTINEL LYMPH NODE BIOPSY Left 03/08/2020   Procedure: LEFT BREAST LUMPECTOMY WITH RADIOACTIVE SEED AND LEFT AXILLARY SENTINEL LYMPH NODE BIOPSY;  Surgeon: Rolm Bookbinder, MD;  Location: [facility];  Service: General;  Laterality: Left;  PEC BLOCK  . BREAST SURGERY     benign.  25 yrs ago  . CARPAL TUNNEL RELEASE  1980's   bilateral   . CATARACT EXTRACTION W/PHACO Left 06/12/2016   Procedure: CATARACT EXTRACTION PHACO AND INTRAOCULAR LENS PLACEMENT LEFT EYE GHW2.99;  Surgeon: Tonny Branch, MD;  [facility];  Service: Ophthalmology;  Laterality: Left;  left  . CATARACT EXTRACTION W/PHACO Right 07/24/2016   Procedure: CATARACT EXTRACTION PHACO AND INTRAOCULAR LENS PLACEMENT (IOC);  Surgeon: Tonny Branch, MD;  Location: [facility];  Service: Ophthalmology;  Laterality: Right;  right cde 8.29  . COLONOSCOPY N/A 10/29/2012   Procedure: COLONOSCOPY;  Surgeon: Rogene Houston, MD;  [facility];  Service: Endoscopy;  Laterality: N/A;  140  . Dental implants    . FINGER SURGERY     trigger finger 3rd  bilaterally  . laproscopy     over 20 yrs ago  . NECK SURGERY     2007  . TONSILLECTOMY      SOCIAL HISTORY:  Social History   Socioeconomic History  . Marital status: Divorced    Spouse name: Not on file  . Number of children: Not on file  . Years of education: Not on file  .  Highest education level: Not on file  Occupational History  . Occupation: retired  Tobacco Use  . Smoking status: Former Smoker    Packs/day: 2.00    Years: 40.00    Pack years: 80.00    Types: Cigarettes    Quit date: 09/04/2004    Years since quitting: 15.5  . Smokeless tobacco: Never Used  Substance and Sexual Activity  . Alcohol use: Yes    Comment: occasionally+  . Drug use: No  . Sexual activity: Yes  Other Topics Concern  . Not on file  Social History Narrative  . Not on file   Social Determinants of Health   Financial  Resource Strain:   . Difficulty of Paying Living Expenses: Not on file  Food Insecurity:   . Worried About Charity fundraiser in the Last Year: Not on file  . Ran Out of Food in the Last Year: Not on file  Transportation Needs:   . Lack of Transportation (Medical): Not on file  . Lack of Transportation (Non-Medical): Not on file  Physical Activity:   . Days of Exercise per Week: Not on file  . Minutes of Exercise per Session: Not on file  Stress:   . Feeling of Stress : Not on file  Social Connections:   . Frequency of Communication with Friends and Family: Not on file  . Frequency of Social Gatherings with Friends and Family: Not on file  . Attends Religious Services: Not on file  . Active Member of Clubs or Organizations: Not on file  . Attends Archivist Meetings: Not on file  . Marital Status: Not on file  Intimate Partner Violence:   . Fear of Current or Ex-Partner: Not on file  . Emotionally Abused: Not on file  . Physically Abused: Not on file  . Sexually Abused: Not on file    FAMILY HISTORY:  Family History  Problem Relation Age of Onset  . Alcoholism Mother   . Depression Mother   . Arthritis Father   . Thyroid disease Father   . Heart attack Paternal Uncle   . Hypertension Maternal Grandmother   . Dementia Maternal Grandmother   . Heart attack Maternal Grandfather   . Arthritis Maternal Grandfather   . Hip fracture Paternal Grandmother     CURRENT MEDICATIONS:  Current Outpatient Medications  Medication Sig Dispense Refill  . albuterol (VENTOLIN HFA) 108 (90 Base) MCG/ACT inhaler Inhale 2 puffs into the lungs every 6 (six) hours as needed for wheezing or shortness of breath. 1 Inhaler 6  . buPROPion (WELLBUTRIN SR) 200 MG 12 hr tablet Take 200 mg by mouth 2 (two) times daily.     . cycloSPORINE (RESTASIS) 0.05 % ophthalmic emulsion Place 1 drop into both eyes 2 (two) times daily.     Marland Kitchen escitalopram (LEXAPRO) 10 MG tablet Take 5 mg by mouth 2 (two)  times daily.     Marland Kitchen HYDROcodone-acetaminophen (NORCO) 10-325 MG tablet Take 1 tablet by mouth every 6 (six) hours as needed. 10 tablet 0  . ibuprofen (ADVIL) 400 MG tablet Take 400 mg by mouth daily as needed for moderate pain.     . Multiple Vitamin (MULTIVITAMIN WITH MINERALS) TABS tablet Take 1 tablet by mouth daily.    . Omega-3 Fatty Acids (FISH OIL) 1000 MG CAPS Take 1,000 mg by mouth daily.     Marland Kitchen PULMICORT FLEXHALER 180 MCG/ACT inhaler INHALE TWO PUFFS TWICE DAILY (Patient taking differently: Inhale 2 puffs into the lungs  in the morning and at bedtime. ) 1 each 3  . rosuvastatin (CRESTOR) 5 MG tablet Take 5 mg by mouth 3 (three) times a week.    . Turmeric 500 MG CAPS Take 500 mg by mouth daily.     Marland Kitchen zolpidem (AMBIEN) 5 MG tablet Take 5 mg by mouth at bedtime as needed for sleep.      No current facility-administered medications for this visit.    ALLERGIES:  Allergies  Allergen Reactions  . Codeine Other (See Comments)    Sweating and Patient just doesn't like.     PHYSICAL EXAM:  Performance status (ECOG): 1 - Symptomatic but completely ambulatory  Vitals:   03/26/20 1139  BP: 133/69  Pulse: 72  Resp: 18  Temp: (!) 97 F (36.1 C)  SpO2: 98%   Wt Readings from Last 3 Encounters:  03/26/20 139 lb 9.6 oz (63.3 kg)  03/06/20 136 lb 9.6 oz (62 kg)  02/15/20 135 lb 6.4 oz (61.4 kg)   Physical Exam Vitals reviewed.  Constitutional:      Appearance: Normal appearance.  Cardiovascular:     Rate and Rhythm: Normal rate and regular rhythm.     Pulses: Normal pulses.     Heart sounds: Normal heart sounds.  Pulmonary:     Effort: Pulmonary effort is normal.     Breath sounds: Normal breath sounds.  Chest:     Breasts:        Left: No inverted nipple, mass, skin change or tenderness.    Musculoskeletal:       Arms:     Right lower leg: No edema.     Left lower leg: No edema.  Neurological:     General: No focal deficit present.     Mental Status: She is alert  and oriented to person, place, and time.  Psychiatric:        Mood and Affect: Mood normal.        Behavior: Behavior normal.      LABORATORY DATA:  I have reviewed the labs as listed.  CBC Latest Ref Rng & Units 03/06/2020 06/09/2016  WBC 4.0 - 10.5 K/uL 5.6 6.0  Hemoglobin 12.0 - 15.0 g/dL 14.5 14.3  Hematocrit 36 - 46 % 45.9 43.4  Platelets 150 - 400 K/uL 209 240   CMP Latest Ref Rng & Units 03/06/2020 06/09/2016  Glucose 70 - 99 mg/dL 91 90  BUN 8 - 23 mg/dL 17 17  Creatinine 0.44 - 1.00 mg/dL 0.67 0.72  Sodium 135 - 145 mmol/L 142 140  Potassium 3.5 - 5.1 mmol/L 4.1 3.7  Chloride 98 - 111 mmol/L 103 107  CO2 22 - 32 mmol/L 29 25  Calcium 8.9 - 10.3 mg/dL 9.7 9.2   Surgical pathology ([id number]) on 03/08/2020: Left breast lumpectomy: invasive ductal carcinoma, 1.5 cm, ER/PR positive, HER-2 negative, Ki-67 10%.  DIAGNOSTIC IMAGING:  I have independently reviewed the scans and discussed with the patient. NM Sentinel Node Inj-No Rpt (Breast)  Result Date: 03/08/2020 Sulfur colloid was injected by the nuclear medicine technologist for melanoma sentinel node.   MM Breast Surgical Specimen  Result Date: 03/08/2020 CLINICAL DATA:  Status post seed localized LEFT lumpectomy. EXAM: SPECIMEN RADIOGRAPH OF THE LEFT BREAST COMPARISON:  Previous exam(s). FINDINGS: Status post excision of the left breast. The radioactive seed and ribbon shaped biopsy marker clip are present, completely intact, and were marked for pathology. IMPRESSION: Specimen radiograph of the left breast. Electronically Signed   By: Benjamine Mola  Owens Shark M.D.   On: 03/08/2020 07:59   MM LT RADIOACTIVE SEED LOC MAMMO GUIDE  Result Date: 03/07/2020 CLINICAL DATA:  74 year old female for radioactive seed localization of LEFT breast cancer prior to lumpectomy. EXAM: MAMMOGRAPHIC GUIDED RADIOACTIVE SEED LOCALIZATION OF THE LEFT BREAST COMPARISON:  Previous exam(s). FINDINGS: Patient presents for radioactive seed localization  prior to LEFT lumpectomy. I met with the patient and we discussed the procedure of seed localization including benefits and alternatives. We discussed the high likelihood of a successful procedure. We discussed the risks of the procedure including infection, bleeding, tissue injury and further surgery. We discussed the low dose of radioactivity involved in the procedure. Informed, written consent was given. The usual time-out protocol was performed immediately prior to the procedure. Using mammographic guidance, sterile technique, 1% lidocaine and an I-125 radioactive seed, the OUTER LEFT breast distortion was localized using a LATERAL approach with the seed placed near the distortion center and 0.5 cm anterolateral to the RIBBON biopsy clip. The follow-up mammogram images confirm the seed in the expected location and were marked for Dr. Donne Hazel. Follow-up survey of the patient confirms presence of the radioactive seed. Order number of I-125 seed:  [id number]. Total activity:  5.427 millicuries.  Reference Date: 02/09/2020. The patient tolerated the procedure well and was released from the [facility]. She was given instructions regarding seed removal. IMPRESSION: Radioactive seed localization LEFT breast. The radioactive seed was placed at the distortion, which is located 0.5 cm anterolateral to the RIBBON biopsy clip. No apparent complications. Electronically Signed   By: Margarette Canada M.D.   On: 03/07/2020 13:50     ASSESSMENT:  1.  Stage I (T1CN0) left breast IDC: -Screening mammogram on 12/23/2019 indicated BI-RADS 0 with possible left breast mass. -Left breast ultrasound and mammogram with additional views on 01/10/2020 shows 1.6 x 1.1 x 1.2 cm mass in the left breast at 3 o'clock position.  No left axillary adenopathy. -Biopsy on 01/20/2020 shows grade 2 IDC, ER 95%, PR 75% positive, Ki-67 10% and HER-2/neu 1+ by IHC. -She had lumpectomy x2 in the left breast more than 20 years ago which were  benign. -Pathology on March 08, 2020 shows 1.5 cm invasive ductal carcinoma, grade 2, margins initially positive in the inferior margin, resection margins negative, 0/2 lymph nodes positive for invasive cancer.  ER/PR positive, HER-2 1+, Ki-67 10%.  2.  Social/family history: -She is retired Pharmacist, hospital.  She quit smoking in 2006.  Smoked for 40+ pack years. -Paternal great aunt had breast cancer.  No other malignancies.  3.  Osteoporosis: -DEXA scan on November 06, 2014 with T score -3.1.   PLAN:  1.  Stage I (T1CN0) left breast IDC, ER/PR positive, HER-2 negative: -We reviewed her pathology reports from March 08, 2020. -We discussed various options including antiestrogen therapy versus ordering Oncotype DX.  As per patient preference, will order Oncotype DX.  She has excellent performance status. -We will reevaluate her in 2 weeks to discuss results and further plan.  2.  Bone health: -Last DEXA scan on November 06, 2014 with T score of -3.1. -She did not receive any bisphosphonates as she was scared of ONJ as she has dental implants. -We will repeat DEXA scan prior to next visit. -If osteoporosis, will consider tamoxifen.   Orders placed this encounter:  No orders of the defined types were placed in this encounter.    Derek Jack, MD [place] [phone]   I, Milinda Antis, am acting as  a scribe for Dr. Sanda Linger.  I, Derek Jack MD, have reviewed the above documentation for accuracy and completeness, and I agree with the above.

## 2020-03-26 NOTE — Patient Instructions (Signed)
[place] at [facility] Discharge Instructions  You were seen today by Dr. Delton Coombes. He went over your recent results. Your breast cancer is stage I; your cancer tissue will be sent for further genetic analysis called Oncotype to determine what is the likelihood of your cancer returning. You will also be started on anti-estrogen medication for your breast cancer. You will be scheduled for a bone density scan before your next visit. Dr. Delton Coombes will see you back in 2 weeks for follow up.   Thank you for choosing [place] at [facility] to provide your oncology and hematology care.  To afford each patient quality time with our provider, please arrive at least 15 minutes before your scheduled appointment time.   If you have a lab appointment with the [facility] please come in thru the Main Entrance and check in at the main information desk  You need to re-schedule your appointment should you arrive 10 or more minutes late.  We strive to give you quality time with our providers, and arriving late affects you and other patients whose appointments are after yours.  Also, if you no show three or more times for appointments you may be dismissed from the clinic at the providers discretion.     Again, thank you for choosing [facility].  Our hope is that these requests will decrease the amount of time that you wait before being seen by our physicians.       _____________________________________________________________  Should you have questions after your visit to [facility], please contact our office at [phone] between the hours of 8:00 a.m. and 4:30 p.m.  Voicemails left after 4:00 p.m. will not be returned until the following business day.  For prescription refill requests, have your pharmacy contact our office and allow 72 hours.    Cancer Center Support Programs:   > Cancer Support Group  2nd Tuesday of the  month 1pm-2pm, Journey Room

## 2020-04-04 ENCOUNTER — Ambulatory Visit (HOSPITAL_COMMUNITY)
Admission: RE | Admit: 2020-04-04 | Discharge: 2020-04-04 | Disposition: A | Discharge status: Home / Self Care | Payer: Medicare PPO | Source: Ambulatory Visit | Attending: Hematology | Admitting: Hematology

## 2020-04-04 ENCOUNTER — Other Ambulatory Visit: Payer: Self-pay

## 2020-04-04 DIAGNOSIS — Z780 Asymptomatic menopausal state: Secondary | ICD-10-CM | POA: Diagnosis not present

## 2020-04-04 DIAGNOSIS — Z13820 Encounter for screening for osteoporosis: Secondary | ICD-10-CM | POA: Diagnosis not present

## 2020-04-04 DIAGNOSIS — Z853 Personal history of malignant neoplasm of breast: Secondary | ICD-10-CM | POA: Diagnosis not present

## 2020-04-04 DIAGNOSIS — M810 Age-related osteoporosis without current pathological fracture: Secondary | ICD-10-CM | POA: Diagnosis not present

## 2020-04-04 DIAGNOSIS — Z8739 Personal history of other diseases of the musculoskeletal system and connective tissue: Secondary | ICD-10-CM | POA: Diagnosis not present

## 2020-04-04 DIAGNOSIS — Z170 Estrogen receptor positive status [ER+]: Secondary | ICD-10-CM | POA: Diagnosis not present

## 2020-04-04 DIAGNOSIS — C50512 Malignant neoplasm of lower-outer quadrant of left female breast: Secondary | ICD-10-CM | POA: Diagnosis not present

## 2020-04-05 ENCOUNTER — Encounter: Payer: Self-pay | Admitting: Physical Therapy

## 2020-04-05 ENCOUNTER — Ambulatory Visit: Payer: Medicare PPO | Attending: General Surgery | Admitting: Physical Therapy

## 2020-04-05 DIAGNOSIS — M25612 Stiffness of left shoulder, not elsewhere classified: Secondary | ICD-10-CM | POA: Insufficient documentation

## 2020-04-05 DIAGNOSIS — R600 Localized edema: Secondary | ICD-10-CM | POA: Insufficient documentation

## 2020-04-05 DIAGNOSIS — Z170 Estrogen receptor positive status [ER+]: Secondary | ICD-10-CM | POA: Diagnosis not present

## 2020-04-05 DIAGNOSIS — C50412 Malignant neoplasm of upper-outer quadrant of left female breast: Secondary | ICD-10-CM | POA: Diagnosis not present

## 2020-04-05 DIAGNOSIS — Z483 Aftercare following surgery for neoplasm: Secondary | ICD-10-CM | POA: Diagnosis not present

## 2020-04-05 DIAGNOSIS — R293 Abnormal posture: Secondary | ICD-10-CM | POA: Insufficient documentation

## 2020-04-05 NOTE — Therapy (Signed)
[place], [place], [address] Phone: [phone]   Fax:  [phone]  Physical Therapy Treatment  Patient Details  Name: Dominique Cobb MRN: [id number] Date of Birth: May 05, 1946 Referring Provider (PT): Donne Hazel   Encounter Date: 04/05/2020   PT End of Session - 04/05/20 1356    Visit Number 2    Number of Visits 10    Date for PT Re-Evaluation 05/03/20    PT Start Time 7915    PT Stop Time 1355    PT Time Calculation (min) 50 min    Activity Tolerance Patient tolerated treatment well    Behavior During Therapy [facility] for tasks assessed/performed           Past Medical History:  Diagnosis Date  . Anxiety   . Aortic atherosclerosis ([place])   . Arthritis   . Asthma   . Depression   . Dysphagia   . Dyspnea    on exertion  . Family history of adverse reaction to anesthesia    father's lung collapsed during lumbar surgery  . GERD (gastroesophageal reflux disease)     Past Surgical History:  Procedure Laterality Date  . BREAST CYST ASPIRATION Left   . BREAST EXCISIONAL BIOPSY Left   . BREAST LUMPECTOMY WITH RADIOACTIVE SEED AND SENTINEL LYMPH NODE BIOPSY Left 03/08/2020   Procedure: LEFT BREAST LUMPECTOMY WITH RADIOACTIVE SEED AND LEFT AXILLARY SENTINEL LYMPH NODE BIOPSY;  Surgeon: Rolm Bookbinder, MD;  Location: [place] [facility];  Service: General;  Laterality: Left;  PEC BLOCK  . BREAST SURGERY     benign.  25 yrs ago  . CARPAL TUNNEL RELEASE  1980's   bilateral   . CATARACT EXTRACTION W/PHACO Left 06/12/2016   Procedure: CATARACT EXTRACTION PHACO AND INTRAOCULAR LENS PLACEMENT LEFT EYE WCH3.64;  Surgeon: Tonny Branch, MD;  [facility];  Service: Ophthalmology;  Laterality: Left;  left  . CATARACT EXTRACTION W/PHACO Right 07/24/2016   Procedure: CATARACT EXTRACTION PHACO AND INTRAOCULAR LENS PLACEMENT (IOC);  Surgeon: Tonny Branch, MD;  [facility];  Service: Ophthalmology;  Laterality: Right;   right cde 8.29  . COLONOSCOPY N/A 10/29/2012   Procedure: COLONOSCOPY;  Surgeon: Rogene Houston, MD;  [facility];  Service: Endoscopy;  Laterality: N/A;  140  . Dental implants    . FINGER SURGERY     trigger finger 3rd  bilaterally  . laproscopy     over 20 yrs ago  . NECK SURGERY     2007  . TONSILLECTOMY      There were no vitals filed for this visit.   Subjective Assessment - 04/05/20 1307    Subjective The surgery went fine but I was not prepared for the lymph node removal. It is painful in my arm.    Pertinent History L breast cancer, L breast lumpectomy and SLNB (2 both negative) 10/7 ER/PR+/HER2-, previous L breast lumpectomy with lymph node biopsy that turned out to not be cancer    Patient Stated Goals to gain info from provider    Currently in Pain? No/denies    Pain Score 0-No pain              [facility] PT Assessment - 04/05/20 0001      Observation/Other Assessments   Observations cording palpable at antecubital fossa'      AROM   Left Shoulder Flexion 119 Degrees   after myofascial release for cording: 178   Left Shoulder ABduction 93 Degrees  after myofascial release for cording: 177   Left Shoulder Internal Rotation 68 Degrees    Left Shoulder External Rotation 90 Degrees             LYMPHEDEMA/ONCOLOGY QUESTIONNAIRE - 04/05/20 0001      Left Upper Extremity Lymphedema   15 cm Proximal to Olecranon Process 27 cm    Olecranon Process 21.5 cm    15 cm Proximal to Ulnar Styloid Process 22.2 cm    Just Proximal to Ulnar Styloid Process 14.1 cm    Across Hand at [employer] 17 cm    At [place] of 2nd Digit 5.4 cm                      [facility] Adult PT Treatment/Exercise - 04/05/20 0001      Manual Therapy   Manual Therapy Myofascial release    Myofascial Release to cording from L axilla to L wrist - 14 releases noted today, there is still a thick cord present that is tender at end range but is not limiting range of motion                         PT Long Term Goals - 04/05/20 1347      PT LONG TERM GOAL #1   Title Pt will return to baseline ROM measurements to allow pt to return to PLOF.    Time 8    Period Weeks    Status On-going      PT LONG TERM GOAL #2   Title Pt will demonstrate no cording that is limiting ROM to allow her to return to prior level of function.    Time 4    Period Weeks    Status New    Target Date 05/03/20      PT LONG TERM GOAL #3   Title Pt will be independent in self MLD for management of localized edema.    Time 4    Period Weeks    Status New    Target Date 05/03/20                 Plan - 04/05/20 1358    Clinical Impression Statement Pt returns to PT for post op follow up visit. Pt underwent a L lumpectomy and SLNB on 03/08/20. She most likely will not require chemo or radiation. She has been having pain down her left arm which worsens when she tries to lift it. Numerous palpable cords are present from axilla to wrist. Began myofascial release today and 14 releases were noted. After this pt returned to baseline ROM but most likely the cording will cause her to tighen back up. Pt also has some mild surigical edema in left breast and would benefit from MLD as well as scar mobilization to L SLNB scar.    Rehab Potential Good    PT Frequency 2x / week    PT Duration 4 weeks    PT Treatment/Interventions ADLs/Self Care Home Management;Therapeutic exercise;Patient/family education;Manual lymph drainage;Manual techniques;Orthotic Fit/Training;Passive range of motion    PT Next Visit Plan continue MFR to cording, if cording has returned may need sleeve for comfort, MLD to L breast and instruct pt    PT Home Exercise Plan post op breast exercises, supine dowel    Consulted and Agree with Plan of Care Patient           Patient will benefit from skilled therapeutic intervention  in order to improve the following deficits and impairments:  Postural dysfunction,  Decreased knowledge of precautions, Impaired UE functional use, Pain, Increased fascial restricitons, Increased edema, Decreased scar mobility, Decreased range of motion  Visit Diagnosis: Stiffness of left shoulder, not elsewhere classified  Aftercare following surgery for neoplasm  Localized edema  Abnormal posture  Malignant neoplasm of upper-outer quadrant of left breast in female, estrogen receptor positive ([place])     Problem List Patient Active Problem List   Diagnosis Date Noted  . Breast cancer of lower-outer quadrant of left female breast ([place]) 02/15/2020  . Change in stool 10/15/2012  . Dysphagia 03/20/2011  . GERD (gastroesophageal reflux disease) 03/20/2011  . Depression 03/20/2011    Allyson Sabal [facility] 04/05/2020, 2:01 PM  [place], [place], [address] Phone: [phone]   Fax:  [phone]  Name: PATRIZIA PAULE MRN: [id number] Date of Birth: Mar 18, 1946  Manus Gunning, PT 04/05/20 2:02 PM

## 2020-04-05 NOTE — Patient Instructions (Signed)
Shoulder: Flexion (Supine)    With hands shoulder width apart, slowly lower dowel to floor behind head. Do not let elbows bend. Keep back flat. Hold 10-20____ seconds. Repeat _10___ times. Do _2___ sessions per day. CAUTION: Stretch slowly and gently.  Copyright  VHI. All rights reserved.  Shoulder: Abduction (Supine)    With left arm flat on floor, hold dowel in palm. Slowly move arm up to side of head by pushing with opposite arm. Do not let elbow bend. Hold 10-20____ seconds. Repeat __10__ times. Do _2___ sessions per day. CAUTION: Stretch slowly and gently.  Copyright  VHI. All rights reserved.

## 2020-04-10 ENCOUNTER — Other Ambulatory Visit: Payer: Self-pay

## 2020-04-10 ENCOUNTER — Encounter: Payer: Self-pay | Admitting: Physical Therapy

## 2020-04-10 ENCOUNTER — Ambulatory Visit (HOSPITAL_COMMUNITY): Payer: Medicare PPO | Admitting: Hematology

## 2020-04-10 ENCOUNTER — Ambulatory Visit: Payer: Medicare PPO | Admitting: Physical Therapy

## 2020-04-10 DIAGNOSIS — R600 Localized edema: Secondary | ICD-10-CM | POA: Diagnosis not present

## 2020-04-10 DIAGNOSIS — C50412 Malignant neoplasm of upper-outer quadrant of left female breast: Secondary | ICD-10-CM | POA: Diagnosis not present

## 2020-04-10 DIAGNOSIS — Z483 Aftercare following surgery for neoplasm: Secondary | ICD-10-CM

## 2020-04-10 DIAGNOSIS — R293 Abnormal posture: Secondary | ICD-10-CM | POA: Diagnosis not present

## 2020-04-10 DIAGNOSIS — Z170 Estrogen receptor positive status [ER+]: Secondary | ICD-10-CM | POA: Diagnosis not present

## 2020-04-10 DIAGNOSIS — M25612 Stiffness of left shoulder, not elsewhere classified: Secondary | ICD-10-CM | POA: Diagnosis not present

## 2020-04-10 NOTE — Therapy (Signed)
[place], [place], [address] Phone: [phone]   Fax:  [phone]  Physical Therapy Treatment  Patient Details  Name: Dominique Cobb MRN: [id number] Date of Birth: 10-Nov-1945 Referring Provider (PT): Donne Hazel   Encounter Date: 04/10/2020   PT End of Session - 04/10/20 1101    Visit Number 3    Number of Visits 10    Date for PT Re-Evaluation 05/03/20    PT Start Time 1010   pt arrived late   PT Stop Time 1059    PT Time Calculation (min) 49 min    Activity Tolerance Patient tolerated treatment well    Behavior During Therapy [facility] for tasks assessed/performed           Past Medical History:  Diagnosis Date  . Anxiety   . Aortic atherosclerosis ([place])   . Arthritis   . Asthma   . Depression   . Dysphagia   . Dyspnea    on exertion  . Family history of adverse reaction to anesthesia    father's lung collapsed during lumbar surgery  . GERD (gastroesophageal reflux disease)     Past Surgical History:  Procedure Laterality Date  . BREAST CYST ASPIRATION Left   . BREAST EXCISIONAL BIOPSY Left   . BREAST LUMPECTOMY WITH RADIOACTIVE SEED AND SENTINEL LYMPH NODE BIOPSY Left 03/08/2020   Procedure: LEFT BREAST LUMPECTOMY WITH RADIOACTIVE SEED AND LEFT AXILLARY SENTINEL LYMPH NODE BIOPSY;  Surgeon: Rolm Bookbinder, MD;  Location: [place];  Service: General;  Laterality: Left;  PEC BLOCK  . BREAST SURGERY     benign.  25 yrs ago  . CARPAL TUNNEL RELEASE  1980's   bilateral   . CATARACT EXTRACTION W/PHACO Left 06/12/2016   Procedure: CATARACT EXTRACTION PHACO AND INTRAOCULAR LENS PLACEMENT LEFT EYE YKZ9.93;  Surgeon: Tonny Branch, MD;  [facility];  Service: Ophthalmology;  Laterality: Left;  left  . CATARACT EXTRACTION W/PHACO Right 07/24/2016   Procedure: CATARACT EXTRACTION PHACO AND INTRAOCULAR LENS PLACEMENT (IOC);  Surgeon: Tonny Branch, MD;  [facility];  Service: Ophthalmology;   Laterality: Right;  right cde 8.29  . COLONOSCOPY N/A 10/29/2012   Procedure: COLONOSCOPY;  Surgeon: Rogene Houston, MD;  [facility];  Service: Endoscopy;  Laterality: N/A;  140  . Dental implants    . FINGER SURGERY     trigger finger 3rd  bilaterally  . laproscopy     over 20 yrs ago  . NECK SURGERY     2007  . TONSILLECTOMY      There were no vitals filed for this visit.   Subjective Assessment - 04/10/20 1011    Subjective My arm feels so much better since last session. There are still some cords in there.    Pertinent History L breast cancer, L breast lumpectomy and SLNB (2 both negative) 10/7 ER/PR+/HER2-, previous L breast lumpectomy with lymph node biopsy that turned out to not be cancer    Patient Stated Goals to gain info from provider    Currently in Pain? No/denies    Pain Score 0-No pain                             [facility] Adult PT Treatment/Exercise - 04/10/20 0001      Manual Therapy   Myofascial Release to cording from L axilla to L wrist - 1 release noted today at axilla but overall  her cording is no longer limiting ROM                       PT Long Term Goals - 04/05/20 1347      PT LONG TERM GOAL #1   Title Pt will return to baseline ROM measurements to allow pt to return to PLOF.    Time 8    Period Weeks    Status On-going      PT LONG TERM GOAL #2   Title Pt will demonstrate no cording that is limiting ROM to allow her to return to prior level of function.    Time 4    Period Weeks    Status New    Target Date 05/03/20      PT LONG TERM GOAL #3   Title Pt will be independent in self MLD for management of localized edema.    Time 4    Period Weeks    Status New    Target Date 05/03/20                 Plan - 04/10/20 1102    Clinical Impression Statement Pt states she has had increased ROM and feels much better after last session. She has been able to raise her arm and do her normal  activities. She still has numerous cords present from axilla to wrist so continued with myofascial release to these today with 1 release noted at axilla but the other cords stretched out this session and were less palpable.    PT Frequency 2x / week    PT Duration 4 weeks    PT Treatment/Interventions ADLs/Self Care Home Management;Therapeutic exercise;Patient/family education;Manual lymph drainage;Manual techniques;Orthotic Fit/Training;Passive range of motion    PT Next Visit Plan continue MFR to cording, if cording has returned may need sleeve for comfort, MLD to L breast and instruct pt    Consulted and Agree with Plan of Care Patient           Patient will benefit from skilled therapeutic intervention in order to improve the following deficits and impairments:  Postural dysfunction, Decreased knowledge of precautions, Impaired UE functional use, Pain, Increased fascial restricitons, Increased edema, Decreased scar mobility, Decreased range of motion  Visit Diagnosis: Stiffness of left shoulder, not elsewhere classified  Aftercare following surgery for neoplasm     Problem List Patient Active Problem List   Diagnosis Date Noted  . Breast cancer of lower-outer quadrant of left female breast ([place]) 02/15/2020  . Change in stool 10/15/2012  . Dysphagia 03/20/2011  . GERD (gastroesophageal reflux disease) 03/20/2011  . Depression 03/20/2011    Allyson Sabal [facility] 04/10/2020, 11:03 AM  [place], [place], [address] Phone: [phone]   Fax:  [phone]  Name: Dominique Cobb MRN: [id number] Date of Birth: [birth year]  Manus Gunning, PT 04/10/20 11:04 AM

## 2020-04-12 ENCOUNTER — Encounter (HOSPITAL_COMMUNITY): Payer: Self-pay

## 2020-04-12 DIAGNOSIS — C50512 Malignant neoplasm of lower-outer quadrant of left female breast: Secondary | ICD-10-CM | POA: Diagnosis not present

## 2020-04-12 DIAGNOSIS — Z170 Estrogen receptor positive status [ER+]: Secondary | ICD-10-CM | POA: Diagnosis not present

## 2020-04-16 ENCOUNTER — Encounter: Payer: Medicare PPO | Admitting: Physical Therapy

## 2020-04-18 ENCOUNTER — Other Ambulatory Visit: Payer: Self-pay

## 2020-04-18 ENCOUNTER — Ambulatory Visit: Payer: Medicare PPO | Admitting: Physical Therapy

## 2020-04-18 ENCOUNTER — Encounter: Payer: Self-pay | Admitting: Physical Therapy

## 2020-04-18 DIAGNOSIS — R600 Localized edema: Secondary | ICD-10-CM | POA: Diagnosis not present

## 2020-04-18 DIAGNOSIS — M25612 Stiffness of left shoulder, not elsewhere classified: Secondary | ICD-10-CM

## 2020-04-18 DIAGNOSIS — Z170 Estrogen receptor positive status [ER+]: Secondary | ICD-10-CM | POA: Diagnosis not present

## 2020-04-18 DIAGNOSIS — C50412 Malignant neoplasm of upper-outer quadrant of left female breast: Secondary | ICD-10-CM | POA: Diagnosis not present

## 2020-04-18 DIAGNOSIS — R293 Abnormal posture: Secondary | ICD-10-CM | POA: Diagnosis not present

## 2020-04-18 DIAGNOSIS — Z483 Aftercare following surgery for neoplasm: Secondary | ICD-10-CM | POA: Diagnosis not present

## 2020-04-18 NOTE — Therapy (Signed)
[place], [place], [address] Phone: [phone]   Fax:  [phone]  Physical Therapy Treatment  Patient Details  Name: Dominique Cobb MRN: [id number] Date of Birth: 07-08-1945 Referring Provider (PT): Ave Filter Date: 04/18/2020   PT End of Session - 04/18/20 1548    Visit Number 4    Number of Visits 10    Date for PT Re-Evaluation 05/03/20    PT Start Time 6754    PT Stop Time 1545   pt had to leave early   PT Time Calculation (min) 38 min    Activity Tolerance Patient tolerated treatment well    Behavior During Therapy [facility] for tasks assessed/performed           Past Medical History:  Diagnosis Date  . Anxiety   . Aortic atherosclerosis ([place])   . Arthritis   . Asthma   . Depression   . Dysphagia   . Dyspnea    on exertion  . Family history of adverse reaction to anesthesia    father's lung collapsed during lumbar surgery  . GERD (gastroesophageal reflux disease)     Past Surgical History:  Procedure Laterality Date  . BREAST CYST ASPIRATION Left   . BREAST EXCISIONAL BIOPSY Left   . BREAST LUMPECTOMY WITH RADIOACTIVE SEED AND SENTINEL LYMPH NODE BIOPSY Left 03/08/2020   Procedure: LEFT BREAST LUMPECTOMY WITH RADIOACTIVE SEED AND LEFT AXILLARY SENTINEL LYMPH NODE BIOPSY;  Surgeon: Rolm Bookbinder, MD;  Location: [place];  Service: General;  Laterality: Left;  PEC BLOCK  . BREAST SURGERY     benign.  25 yrs ago  . CARPAL TUNNEL RELEASE  1980's   bilateral   . CATARACT EXTRACTION W/PHACO Left 06/12/2016   Procedure: CATARACT EXTRACTION PHACO AND INTRAOCULAR LENS PLACEMENT LEFT EYE GBE0.10;  Surgeon: Tonny Branch, MD;  [facility];  Service: Ophthalmology;  Laterality: Left;  left  . CATARACT EXTRACTION W/PHACO Right 07/24/2016   Procedure: CATARACT EXTRACTION PHACO AND INTRAOCULAR LENS PLACEMENT (IOC);  Surgeon: Tonny Branch, MD;  [facility];  Service:  Ophthalmology;  Laterality: Right;  right cde 8.29  . COLONOSCOPY N/A 10/29/2012   Procedure: COLONOSCOPY;  Surgeon: Rogene Houston, MD;  [facility];  Service: Endoscopy;  Laterality: N/A;  140  . Dental implants    . FINGER SURGERY     trigger finger 3rd  bilaterally  . laproscopy     over 20 yrs ago  . NECK SURGERY     2007  . TONSILLECTOMY      There were no vitals filed for this visit.   Subjective Assessment - 04/18/20 1508    Subjective The cording is still there but I am using my arm. My nipple has no color.    Pertinent History L breast cancer, L breast lumpectomy and SLNB (2 both negative) 10/7 ER/PR+/HER2-, previous L breast lumpectomy with lymph node biopsy that turned out to not be cancer    Patient Stated Goals to gain info from provider    Currently in Pain? No/denies    Pain Score 0-No pain                             [facility] Adult PT Treatment/Exercise - 04/18/20 0001      Manual Therapy   Manual Therapy Myofascial release;Soft tissue mobilization    Soft tissue mobilization along lumpectomy scar to reduce  fibrosis    Myofascial Release to cording from L axilla to L wrist - 7 releases noted today                        PT Long Term Goals - 04/05/20 1347      PT LONG TERM GOAL #1   Title Pt will return to baseline ROM measurements to allow pt to return to PLOF.    Time 8    Period Weeks    Status On-going      PT LONG TERM GOAL #2   Title Pt will demonstrate no cording that is limiting ROM to allow her to return to prior level of function.    Time 4    Period Weeks    Status New    Target Date 05/03/20      PT LONG TERM GOAL #3   Title Pt will be independent in self MLD for management of localized edema.    Time 4    Period Weeks    Status New    Target Date 05/03/20                 Plan - 04/18/20 1549    Clinical Impression Statement Pt returns to PT with cording still present. Multiple cords  presents some very thick and others thin. Seven cords released today with myofascial release. Pt felt relief with this at end of session. Began some light soft tissue mobilization along lumpectomy scar since steri strips fell off. Pt has some visible swelling at left wrist and antecubital fossa. She would benefit from a compression sleeve but had to leave early today so she will be measured on Monday.    Comorbidities previous L breast biopsy with nodes biopsied but was not cancer, hx of R rotator cuff surgery    PT Frequency 2x / week    PT Duration 4 weeks    PT Treatment/Interventions ADLs/Self Care Home Management;Therapeutic exercise;Patient/family education;Manual lymph drainage;Manual techniques;Orthotic Fit/Training;Passive range of motion    PT Next Visit Plan can we use a free sleeve?, measure for sleeve if so and if no send script for sleeve, continue MFR to cording, if cording has returned may need sleeve for comfort, MLD to L breast and instruct pt    PT Home Exercise Plan post op breast exercises, supine dowel    Consulted and Agree with Plan of Care Patient           Patient will benefit from skilled therapeutic intervention in order to improve the following deficits and impairments:  Postural dysfunction, Decreased knowledge of precautions, Impaired UE functional use, Pain, Increased fascial restricitons, Increased edema, Decreased scar mobility, Decreased range of motion  Visit Diagnosis: Stiffness of left shoulder, not elsewhere classified  Aftercare following surgery for neoplasm     Problem List Patient Active Problem List   Diagnosis Date Noted  . Breast cancer of lower-outer quadrant of left female breast ([place]) 02/15/2020  . Change in stool 10/15/2012  . Dysphagia 03/20/2011  . GERD (gastroesophageal reflux disease) 03/20/2011  . Depression 03/20/2011    Allyson Sabal [facility] 04/18/2020, 3:53 PM  [place], [place], [address] Phone: [phone]   Fax:  [phone]  Name: Dominique Cobb MRN: [id number] Date of Birth: 10/06/1945  Manus Gunning, PT 04/18/20 3:53 PM

## 2020-04-23 ENCOUNTER — Encounter: Payer: Self-pay | Admitting: Physical Therapy

## 2020-04-23 ENCOUNTER — Other Ambulatory Visit: Payer: Self-pay

## 2020-04-23 ENCOUNTER — Ambulatory Visit: Payer: Medicare PPO | Admitting: Physical Therapy

## 2020-04-23 DIAGNOSIS — R600 Localized edema: Secondary | ICD-10-CM

## 2020-04-23 DIAGNOSIS — R293 Abnormal posture: Secondary | ICD-10-CM | POA: Diagnosis not present

## 2020-04-23 DIAGNOSIS — Z483 Aftercare following surgery for neoplasm: Secondary | ICD-10-CM | POA: Diagnosis not present

## 2020-04-23 DIAGNOSIS — C50412 Malignant neoplasm of upper-outer quadrant of left female breast: Secondary | ICD-10-CM | POA: Diagnosis not present

## 2020-04-23 DIAGNOSIS — Z170 Estrogen receptor positive status [ER+]: Secondary | ICD-10-CM | POA: Diagnosis not present

## 2020-04-23 DIAGNOSIS — M25612 Stiffness of left shoulder, not elsewhere classified: Secondary | ICD-10-CM

## 2020-04-23 NOTE — Therapy (Signed)
[place], [place], [address] Phone: [phone]   Fax:  [phone]  Physical Therapy Treatment  Patient Details  Name: Dominique Cobb MRN: [id number] Date of Birth: 22-Jul-1945 Referring Provider (PT): Donne Hazel   Encounter Date: 04/23/2020   PT End of Session - 04/23/20 1558    Visit Number 5    Number of Visits 10    Date for PT Re-Evaluation 05/03/20    PT Start Time 1302    PT Stop Time 1358    PT Time Calculation (min) 56 min    Activity Tolerance Patient tolerated treatment well    Behavior During Therapy [facility] for tasks assessed/performed           Past Medical History:  Diagnosis Date  . Anxiety   . Aortic atherosclerosis ([place])   . Arthritis   . Asthma   . Depression   . Dysphagia   . Dyspnea    on exertion  . Family history of adverse reaction to anesthesia    father's lung collapsed during lumbar surgery  . GERD (gastroesophageal reflux disease)     Past Surgical History:  Procedure Laterality Date  . BREAST CYST ASPIRATION Left   . BREAST EXCISIONAL BIOPSY Left   . BREAST LUMPECTOMY WITH RADIOACTIVE SEED AND SENTINEL LYMPH NODE BIOPSY Left 03/08/2020   Procedure: LEFT BREAST LUMPECTOMY WITH RADIOACTIVE SEED AND LEFT AXILLARY SENTINEL LYMPH NODE BIOPSY;  Surgeon: Rolm Bookbinder, MD;  Location: [place];  Service: General;  Laterality: Left;  PEC BLOCK  . BREAST SURGERY     benign.  25 yrs ago  . CARPAL TUNNEL RELEASE  1980's   bilateral   . CATARACT EXTRACTION W/PHACO Left 06/12/2016   Procedure: CATARACT EXTRACTION PHACO AND INTRAOCULAR LENS PLACEMENT LEFT EYE DJT7.01;  Surgeon: Tonny Branch, MD;  [facility];  Service: Ophthalmology;  Laterality: Left;  left  . CATARACT EXTRACTION W/PHACO Right 07/24/2016   Procedure: CATARACT EXTRACTION PHACO AND INTRAOCULAR LENS PLACEMENT (IOC);  Surgeon: Tonny Branch, MD;  [facility];  Service: Ophthalmology;  Laterality: Right;   right cde 8.29  . COLONOSCOPY N/A 10/29/2012   Procedure: COLONOSCOPY;  Surgeon: Rogene Houston, MD;  [facility];  Service: Endoscopy;  Laterality: N/A;  140  . Dental implants    . FINGER SURGERY     trigger finger 3rd  bilaterally  . laproscopy     over 20 yrs ago  . NECK SURGERY     2007  . TONSILLECTOMY      There were no vitals filed for this visit.   Subjective Assessment - 04/23/20 1505    Subjective The swelling and cording is no worse. I can feel the cording in my axilla.    Pertinent History L breast cancer, L breast lumpectomy and SLNB (2 both negative) 10/7 ER/PR+/HER2-, previous L breast lumpectomy with lymph node biopsy that turned out to not be cancer    Patient Stated Goals to gain info from provider    Currently in Pain? No/denies    Pain Score 0-No pain                  L-DEX FLOWSHEETS - 04/23/20 1600      L-DEX LYMPHEDEMA SCREENING   BASELINE SCORE (UNILATERAL) 2.2    L-DEX SCORE (UNILATERAL) 4.3    VALUE CHANGE (UNILAT) 2.1  [facility] Adult PT Treatment/Exercise - 04/23/20 0001      Manual Therapy   Manual Therapy Manual Lymphatic Drainage (MLD);Myofascial release;Edema management    Edema Management repeated SOZO since pt developed swelling but the change was less than 6.5    Myofascial Release to cording from left wrist to axilla    Manual Lymphatic Drainage (MLD) in supine: short neck, 5 diaphragmatic breaths, left inginal nodes and establishment of axillo inguinal pathway, right axillary nodes and establishment of inter axillary pathway, LUE working proximal to distal then retracing all steps                       PT Long Term Goals - 04/05/20 1347      PT LONG TERM GOAL #1   Title Pt will return to baseline ROM measurements to allow pt to return to PLOF.    Time 8    Period Weeks    Status On-going      PT LONG TERM GOAL #2   Title Pt will demonstrate no cording that is limiting  ROM to allow her to return to prior level of function.    Time 4    Period Weeks    Status New    Target Date 05/03/20      PT LONG TERM GOAL #3   Title Pt will be independent in self MLD for management of localized edema.    Time 4    Period Weeks    Status New    Target Date 05/03/20                 Plan - 04/23/20 1600    Clinical Impression Statement Remeasured pt with SOZO and the change was less than 6.5 so currently she does not have subclinical lymphedema despite onset of edema since the cording. Will send script to Dr. Donne Hazel for pt to purchase a compression sleeve and glove for swellign and comfort. Continued to work on cording today and improving L shoulder ROM and completed treatment with MLD for swelling in LUE.    PT Frequency 2x / week    PT Duration 4 weeks    PT Treatment/Interventions ADLs/Self Care Home Management;Therapeutic exercise;Patient/family education;Manual lymph drainage;Manual techniques;Orthotic Fit/Training;Passive range of motion    PT Next Visit Plan see if signed script is back, continue MFR to cording, , MLD to L breast and UE and instruct pt    PT Home Exercise Plan post op breast exercises, supine dowel    Consulted and Agree with Plan of Care Patient           Patient will benefit from skilled therapeutic intervention in order to improve the following deficits and impairments:  Postural dysfunction, Decreased knowledge of precautions, Impaired UE functional use, Pain, Increased fascial restricitons, Increased edema, Decreased scar mobility, Decreased range of motion  Visit Diagnosis: Stiffness of left shoulder, not elsewhere classified  Aftercare following surgery for neoplasm  Localized edema     Problem List Patient Active Problem List   Diagnosis Date Noted  . Breast cancer of lower-outer quadrant of left female breast ([place]) 02/15/2020  . Change in stool 10/15/2012  . Dysphagia 03/20/2011  . GERD (gastroesophageal  reflux disease) 03/20/2011  . Depression 03/20/2011    Allyson Sabal [facility] 04/23/2020, 4:04 PM  [place], [place], [address] Phone: [phone]   Fax:  [phone]  Name: RANDELL DETTER MRN: [id number] Date of Birth:  1945-08-11  Manus Gunning, PT 04/23/20 4:04 PM

## 2020-04-24 ENCOUNTER — Inpatient Hospital Stay (HOSPITAL_COMMUNITY): Payer: Medicare PPO | Attending: Hematology | Admitting: Hematology

## 2020-04-24 ENCOUNTER — Other Ambulatory Visit (HOSPITAL_COMMUNITY): Payer: Self-pay | Admitting: *Deleted

## 2020-04-24 ENCOUNTER — Encounter (HOSPITAL_COMMUNITY): Payer: Self-pay | Admitting: Hematology

## 2020-04-24 VITALS — BP 137/69 | HR 72 | Temp 96.7°F | Wt 139.6 lb

## 2020-04-24 DIAGNOSIS — Z79899 Other long term (current) drug therapy: Secondary | ICD-10-CM | POA: Diagnosis not present

## 2020-04-24 DIAGNOSIS — C50512 Malignant neoplasm of lower-outer quadrant of left female breast: Secondary | ICD-10-CM

## 2020-04-24 DIAGNOSIS — Z87891 Personal history of nicotine dependence: Secondary | ICD-10-CM | POA: Diagnosis not present

## 2020-04-24 DIAGNOSIS — Z803 Family history of malignant neoplasm of breast: Secondary | ICD-10-CM | POA: Diagnosis not present

## 2020-04-24 DIAGNOSIS — Z8249 Family history of ischemic heart disease and other diseases of the circulatory system: Secondary | ICD-10-CM | POA: Diagnosis not present

## 2020-04-24 DIAGNOSIS — M810 Age-related osteoporosis without current pathological fracture: Secondary | ICD-10-CM | POA: Diagnosis not present

## 2020-04-24 DIAGNOSIS — Z170 Estrogen receptor positive status [ER+]: Secondary | ICD-10-CM | POA: Diagnosis not present

## 2020-04-24 DIAGNOSIS — C50412 Malignant neoplasm of upper-outer quadrant of left female breast: Secondary | ICD-10-CM | POA: Diagnosis not present

## 2020-04-24 MED ORDER — TAMOXIFEN CITRATE 20 MG PO TABS: 20.0000 mg | ORAL_TABLET | Freq: Every day | ORAL | 3 refills | Status: DC

## 2020-04-24 NOTE — Patient Instructions (Signed)
[place] at [facility] Discharge Instructions  You were seen today by Dr. Delton Coombes. He went over your recent results. You will be prescribed tamoxifen 20 mg daily for your breast cancer. Certain side effects to expect are hot flashes, mood changes, spotting or bleeding, and if these symptoms develop, please call the office immediately. Dr. Inda Merlin will be contacted to determine if it is best to continue taking tamoxifen with Wellbutrin. Purchase calcium and vitamin D over the counter and take daily to strengthen your bones. Dr. Delton Coombes will see you back in 4 months for labs and follow up.   Thank you for choosing [place] at [facility] to provide your oncology and hematology care.  To afford each patient quality time with our provider, please arrive at least 15 minutes before your scheduled appointment time.   If you have a lab appointment with the [facility] please come in thru the Main Entrance and check in at the main information desk  You need to re-schedule your appointment should you arrive 10 or more minutes late.  We strive to give you quality time with our providers, and arriving late affects you and other patients whose appointments are after yours.  Also, if you no show three or more times for appointments you may be dismissed from the clinic at the providers discretion.     Again, thank you for choosing [facility].  Our hope is that these requests will decrease the amount of time that you wait before being seen by our physicians.       _____________________________________________________________  Should you have questions after your visit to [facility], please contact our office at [phone] between the hours of 8:00 a.m. and 4:30 p.m.  Voicemails left after 4:00 p.m. will not be returned until the following business day.  For prescription refill requests, have your pharmacy contact our office and  allow 72 hours.    Cancer Center Support Programs:   > Cancer Support Group  2nd Tuesday of the month 1pm-2pm, Journey Room

## 2020-04-24 NOTE — Progress Notes (Signed)
[facility] [place], [place] [address]   CLINIC:  Medical Oncology/Hematology  PCP:  Josetta Huddle, MD [address] / [place] [address] [phone]   REASON FOR VISIT:  Follow-up for left breast cancer  PRIOR THERAPY: Left breast lumpectomy with seeds implants on 03/08/2020  NGS Results: Oncotype DX RS 23  CURRENT THERAPY: Under work-up  BRIEF ONCOLOGIC HISTORY:  Oncology History  Breast cancer of lower-outer quadrant of left female breast ([place])  04/12/2020 Genetic Testing   Oncotype DX:        CANCER STAGING: Cancer Staging No matching staging information was found for the patient.  INTERVAL HISTORY:  Dominique Cobb, a 74 y.o. female, returns for routine follow-up of her left breast cancer. Dominique Cobb was last seen on 03/26/2020.   Today she reports feeling well. She has been going to PT for the cords in her left arm and reports having relief and now able to extend her left elbow. She has not consulted with radiology.  She has had dental implants. She used to smoke and was exposed to multiple fumes, including radon.   REVIEW OF SYSTEMS:  Review of Systems  Constitutional: Negative for appetite change and fatigue.  Skin: Negative for itching.  All other systems reviewed and are negative.   PAST MEDICAL/SURGICAL HISTORY:  Past Medical History:  Diagnosis Date  . Anxiety   . Aortic atherosclerosis ([place])   . Arthritis   . Asthma   . Depression   . Dysphagia   . Dyspnea    on exertion  . Family history of adverse reaction to anesthesia    father's lung collapsed during lumbar surgery  . GERD (gastroesophageal reflux disease)    Past Surgical History:  Procedure Laterality Date  . BREAST CYST ASPIRATION Left   . BREAST EXCISIONAL BIOPSY Left   . BREAST LUMPECTOMY WITH RADIOACTIVE SEED AND SENTINEL LYMPH NODE BIOPSY Left 03/08/2020   Procedure: LEFT BREAST LUMPECTOMY WITH RADIOACTIVE SEED AND LEFT  AXILLARY SENTINEL LYMPH NODE BIOPSY;  Surgeon: Rolm Bookbinder, MD;  Location: [facility];  Service: General;  Laterality: Left;  PEC BLOCK  . BREAST SURGERY     benign.  25 yrs ago  . CARPAL TUNNEL RELEASE  1980's   bilateral   . CATARACT EXTRACTION W/PHACO Left 06/12/2016   Procedure: CATARACT EXTRACTION PHACO AND INTRAOCULAR LENS PLACEMENT LEFT EYE SUP1.03;  Surgeon: Tonny Branch, MD;  [facility];  Service: Ophthalmology;  Laterality: Left;  left  . CATARACT EXTRACTION W/PHACO Right 07/24/2016   Procedure: CATARACT EXTRACTION PHACO AND INTRAOCULAR LENS PLACEMENT (IOC);  Surgeon: Tonny Branch, MD;  [facility];  Service: Ophthalmology;  Laterality: Right;  right cde 8.29  . COLONOSCOPY N/A 10/29/2012   Procedure: COLONOSCOPY;  Surgeon: Rogene Houston, MD;  [facility];  Service: Endoscopy;  Laterality: N/A;  140  . Dental implants    . FINGER SURGERY     trigger finger 3rd  bilaterally  . laproscopy     over 20 yrs ago  . NECK SURGERY     2007  . TONSILLECTOMY      SOCIAL HISTORY:  Social History   Socioeconomic History  . Marital status: Divorced    Spouse name: Not on file  . Number of children: Not on file  . Years of education: Not on file  . Highest education level: Not on file  Occupational History  . Occupation: retired  Tobacco Use  .  Smoking status: Former Smoker    Packs/day: 2.00    Years: 40.00    Pack years: 80.00    Types: Cigarettes    Quit date: 09/04/2004    Years since quitting: 15.6  . Smokeless tobacco: Never Used  Substance and Sexual Activity  . Alcohol use: Yes    Comment: occasionally+  . Drug use: No  . Sexual activity: Yes  Other Topics Concern  . Not on file  Social History Narrative  . Not on file   Social Determinants of Health   Financial Resource Strain:   . Difficulty of Paying Living Expenses: Not on file  Food Insecurity:   . Worried About Charity fundraiser in the Last Year: Not on file  . Ran Out of Food in  the Last Year: Not on file  Transportation Needs:   . Lack of Transportation (Medical): Not on file  . Lack of Transportation (Non-Medical): Not on file  Physical Activity:   . Days of Exercise per Week: Not on file  . Minutes of Exercise per Session: Not on file  Stress:   . Feeling of Stress : Not on file  Social Connections:   . Frequency of Communication with Friends and Family: Not on file  . Frequency of Social Gatherings with Friends and Family: Not on file  . Attends Religious Services: Not on file  . Active Member of Clubs or Organizations: Not on file  . Attends Archivist Meetings: Not on file  . Marital Status: Not on file  Intimate Partner Violence:   . Fear of Current or Ex-Partner: Not on file  . Emotionally Abused: Not on file  . Physically Abused: Not on file  . Sexually Abused: Not on file    FAMILY HISTORY:  Family History  Problem Relation Age of Onset  . Alcoholism Mother   . Depression Mother   . Arthritis Father   . Thyroid disease Father   . Heart attack Paternal Uncle   . Hypertension Maternal Grandmother   . Dementia Maternal Grandmother   . Heart attack Maternal Grandfather   . Arthritis Maternal Grandfather   . Hip fracture Paternal Grandmother     CURRENT MEDICATIONS:  Current Outpatient Medications  Medication Sig Dispense Refill  . albuterol (VENTOLIN HFA) 108 (90 Base) MCG/ACT inhaler Inhale 2 puffs into the lungs every 6 (six) hours as needed for wheezing or shortness of breath. 1 Inhaler 6  . buPROPion (WELLBUTRIN SR) 200 MG 12 hr tablet Take 200 mg by mouth 2 (two) times daily.     . cycloSPORINE (RESTASIS) 0.05 % ophthalmic emulsion Place 1 drop into both eyes 2 (two) times daily.     Marland Kitchen escitalopram (LEXAPRO) 10 MG tablet Take 5 mg by mouth 2 (two) times daily.     Marland Kitchen HYDROcodone-acetaminophen (NORCO) 10-325 MG tablet Take 1 tablet by mouth every 6 (six) hours as needed. 10 tablet 0  . ibuprofen (ADVIL) 400 MG tablet Take 400  mg by mouth daily as needed for moderate pain.     . Multiple Vitamin (MULTIVITAMIN WITH MINERALS) TABS tablet Take 1 tablet by mouth daily.    . Omega-3 Fatty Acids (FISH OIL) 1000 MG CAPS Take 1,000 mg by mouth daily.     Marland Kitchen PULMICORT FLEXHALER 180 MCG/ACT inhaler INHALE TWO PUFFS TWICE DAILY (Patient taking differently: Inhale 2 puffs into the lungs in the morning and at bedtime. ) 1 each 3  . rosuvastatin (CRESTOR) 5 MG tablet Take  5 mg by mouth 3 (three) times a week.    . Turmeric 500 MG CAPS Take 500 mg by mouth daily.     Marland Kitchen zolpidem (AMBIEN) 5 MG tablet Take 5 mg by mouth at bedtime as needed for sleep.      No current facility-administered medications for this visit.    ALLERGIES:  Allergies  Allergen Reactions  . Codeine Other (See Comments)    Sweating and Patient just doesn't like.     PHYSICAL EXAM:  Performance status (ECOG): 1 - Symptomatic but completely ambulatory  Vitals:   04/24/20 0808  BP: 137/69  Pulse: 72  Temp: (!) 96.7 F (35.9 C)  SpO2: 100%   Wt Readings from Last 3 Encounters:  04/24/20 139 lb 9.6 oz (63.3 kg)  03/26/20 139 lb 9.6 oz (63.3 kg)  03/06/20 136 lb 9.6 oz (62 kg)   Physical Exam Vitals reviewed.  Constitutional:      Appearance: Normal appearance.  Neurological:     General: No focal deficit present.     Mental Status: She is alert and oriented to person, place, and time.  Psychiatric:        Mood and Affect: Mood normal.        Behavior: Behavior normal.      LABORATORY DATA:  I have reviewed the labs as listed.  CBC Latest Ref Rng & Units 03/06/2020 06/09/2016  WBC 4.0 - 10.5 K/uL 5.6 6.0  Hemoglobin 12.0 - 15.0 g/dL 14.5 14.3  Hematocrit 36 - 46 % 45.9 43.4  Platelets 150 - 400 K/uL 209 240   CMP Latest Ref Rng & Units 03/06/2020 06/09/2016  Glucose 70 - 99 mg/dL 91 90  BUN 8 - 23 mg/dL 17 17  Creatinine 0.44 - 1.00 mg/dL 0.67 0.72  Sodium 135 - 145 mmol/L 142 140  Potassium 3.5 - 5.1 mmol/L 4.1 3.7  Chloride 98 - 111  mmol/L 103 107  CO2 22 - 32 mmol/L 29 25  Calcium 8.9 - 10.3 mg/dL 9.7 9.2    DIAGNOSTIC IMAGING:  I have independently reviewed the scans and discussed with the patient. DG Bone Density  Result Date: 04/04/2020 EXAM: DUAL X-RAY ABSORPTIOMETRY (DXA) FOR BONE MINERAL DENSITY IMPRESSION: Your patient Dominique Cobb completed a BMD test on 04/04/2020 using the [place] (software version: 14.10) manufactured by [employer]. The following summarizes the results of our evaluation. Technologist::TNB PATIENT BIOGRAPHICAL: Name: Dominique Cobb, Dominique Cobb Patient ID:  [id number] Birth Date: August 01, 1945 Height:     64.0 in. Gender:      Female Exam Date:  04/04/2020 Weight:     139.6 lbs. Indications: Caucasian, Follow up Osteopenia, Hx Breast Ca, Low Calcium Intake, Post Menopausal Fractures: Treatments: Multivitamin, Vitamin D DENSITOMETRY RESULTS: Site      Region     Measured Date Measured Age WHO Classification Young Adult T-score BMD         %Change vs. Previous Significant Change (*) AP Spine L1-L2 04/04/2020 76.6 Osteopenia -2.2 0.905 g/cm2 -0.1% - AP Spine L1-L2 11/06/2014 71.1 Osteopenia -2.2 0.906 g/cm2 -3.5% - AP Spine L1-L2 05/19/2012 68.7 Osteopenia -1.9 0.939 g/cm2 -1.6% - AP Spine L1-L2 11/01/2008 65.1 Osteopenia -1.8 0.954 g/cm2 0.3% - AP Spine L1-L2 10/13/2003 60.1 Osteopenia -1.8 0.951 g/cm2 - - DualFemur Neck Left 04/04/2020 76.6 Osteoporosis -3.1 0.604 g/cm2 -0.8% - DualFemur Neck Left 11/06/2014 71.1 Osteoporosis -3.1 0.609 g/cm2 -0.5% - DualFemur Neck Left 05/19/2012 68.7 Osteoporosis -3.1 0.612 g/cm2 -3.8% - DualFemur Neck Left  11/01/2008 65.1 Osteoporosis -2.9 0.636 g/cm2 -4.5% - DualFemur Neck Left 10/13/2003 60.1 Osteoporosis -2.7 0.666 g/cm2 - - DualFemur Total Mean 04/04/2020 76.6 Osteoporosis -2.7 0.667 g/cm2 -3.9% Yes DualFemur Total Mean 11/06/2014 71.1 Osteoporosis -2.5 0.694 g/cm2 3.1% - DualFemur Total Mean 05/19/2012 68.7 Osteoporosis -2.7 0.673 g/cm2 -4.0% Yes  DualFemur Total Mean 11/01/2008 65.1 Osteopenia -2.4 0.701 g/cm2 -1.8% - DualFemur Total Mean 10/13/2003 60.1 Osteopenia -2.3 0.714 g/cm2 - - ASSESSMENT: The BMD measured at Femur Neck Left is 0.604 g/cm2 with a T-score of -3.1. This patient is considered OSTEOPOROTIC according to [facility]) criteria. The scan quality is good. Compared with the prior study on 11/06/2014, the BMD of the lumbar spine and left femoral neck shows no statistically significant change. Compared with the prior study on 11/06/2014, the BMD of the total mean shows a statistically significant decrease. L3 and L4 was excluded  due to advanced degenerative changes. Patient is not a candidate for FRAX assessment due to diagnosis of osteoporosis. World Pharmacologist [facility]) criteria for post-menopausal, Caucasian Women: Normal:       T-score at or above -1 SD Osteopenia:   T-score between -1 and -2.5 SD Osteoporosis: T-score at or below -2.5 SD RECOMMENDATIONS: 1. All patients should optimize calcium and vitamin D intake. 2. Consider FDA-approved medical therapies in postmenopausal women and med aged 14 years and older, based on the following: a. A hip or vertebral (clinical or morphometric) fracture b. T-score < -2.5 at the femoral neck or spine after appropriate evaluation to exclude secondary causes c. Low bone mass (T-score between -1.0 and -2.5 at the femoral neck or spine) and a 10-year probability of a hip fracture > 3% or a 10-year probability of a major osteoporosis-related fracture > 20% based on the [place]-adapted WHO algorithm d. Clinician judgment and/or patient preferences may indicate treatment for people with 10-year fracture probabilities above or below these levels FOLLOW-UP: People with diagnosed cases of osteoporosis or at high risk for fracture should have regular bone mineral density tests. For patients eligible for Medicare, routine testing is allowed once every 2 years. The testing frequency can be  increased to one year for patients who have rapidly progressing disease, those who are receiving or discontinuing medical therapy to restore bone mass, or have additional risk factors. I have reviewed this report, and agree with the above findings. Mark A. Thornton Papas, M.D. [facility] Electronically Signed   By: Lavonia Dana M.D.   On: 04/04/2020 15:00     ASSESSMENT:  1. Stage I (T1CN0) left breast IDC: -Screening mammogram on 12/23/2019 indicated BI-RADS 0 with possible left breast mass. -Left breast ultrasound and mammogram with additional views on 01/10/2020 shows 1.6 x 1.1 x 1.2 cm mass in the left breast at 3 o'clock position. No left axillary adenopathy. -Biopsy on 01/20/2020 shows grade 2 IDC, ER 95%, PR 75% positive, Ki-67 10% and HER-2/neu 1+ by IHC. -She had lumpectomy x2 in the left breast more than 20 years ago which were benign. -Pathology on March 08, 2020 shows 1.5 cm invasive ductal carcinoma, grade 2, margins initially positive in the inferior margin, resection margins negative, 0/2 lymph nodes positive for invasive cancer.  ER/PR positive, HER-2 1+, Ki-67 10%. -Oncotype DX recurrence score 23  2. Social/family history: -She is retired Pharmacist, hospital. She quit smoking in 2006. Smoked for 40+ pack years. -Paternal great aunt had breast cancer. No other malignancies.  3.  Osteoporosis: -DEXA scan on November 06, 2014 with T score -3.1. -  DEXA scan on 04/04/2020 with T score -3.1  PLAN:  1. Stage I (T1CN0) left breast IDC, ER/PR positive, HER-2 negative: -We reviewed results of Oncotype DX which showed a score of 23. -No indication for adjuvant chemotherapy. -Would recommend 5 years of antiestrogen therapy. -Given patient's osteoporosis, recommend tamoxifen in place of aromatase inhibitors. -We talked about the side effects of tamoxifen in detail. -She reports that she is on Wellbutrin for a long time for depression. -I would contact Dr. Inda Merlin and request him to change  Wellbutrin to either Celexa or Effexor which are less likely to inhibit CYP2D6. -Plan to see her back in 4 months to see how she is tolerating tamoxifen.  2. Bone health: -We reviewed repeat DEXA scan from 04/04/2020 with T score of -3.1.  This is similar to DEXA scan from July 2016. -Because of side effects of ONJ, she is reluctant to consider bisphosphonates. -I have recommended taking calcium and vitamin D.  We will check vitamin D level next visit. -We will consider tamoxifen instead of aromatase inhibitor for breast cancer.   Orders placed this encounter:  No orders of the defined types were placed in this encounter.    Derek Jack, MD [place] [phone]   I, Milinda Antis, am acting as a scribe for Dr. Sanda Linger.  I, Derek Jack MD, have reviewed the above documentation for accuracy and completeness, and I agree with the above.

## 2020-04-30 ENCOUNTER — Ambulatory Visit: Payer: Medicare PPO | Admitting: Physical Therapy

## 2020-04-30 ENCOUNTER — Encounter: Payer: Self-pay | Admitting: Physical Therapy

## 2020-04-30 ENCOUNTER — Other Ambulatory Visit: Payer: Self-pay

## 2020-04-30 DIAGNOSIS — R293 Abnormal posture: Secondary | ICD-10-CM | POA: Diagnosis not present

## 2020-04-30 DIAGNOSIS — Z483 Aftercare following surgery for neoplasm: Secondary | ICD-10-CM | POA: Diagnosis not present

## 2020-04-30 DIAGNOSIS — R600 Localized edema: Secondary | ICD-10-CM | POA: Diagnosis not present

## 2020-04-30 DIAGNOSIS — M25612 Stiffness of left shoulder, not elsewhere classified: Secondary | ICD-10-CM | POA: Diagnosis not present

## 2020-04-30 DIAGNOSIS — Z170 Estrogen receptor positive status [ER+]: Secondary | ICD-10-CM | POA: Diagnosis not present

## 2020-04-30 DIAGNOSIS — C50412 Malignant neoplasm of upper-outer quadrant of left female breast: Secondary | ICD-10-CM | POA: Diagnosis not present

## 2020-04-30 NOTE — Therapy (Signed)
[place], [place], [address] Phone: [phone]   Fax:  [phone]  Physical Therapy Treatment  Patient Details  Name: Dominique Cobb MRN: [id number] Date of Birth: Jul 08, 1945 Referring Provider (PT): Ave Filter Date: 04/30/2020   PT End of Session - 04/30/20 1559    Visit Number 6    Number of Visits 10    Date for PT Re-Evaluation 05/03/20    PT Start Time 1504    PT Stop Time 1555    PT Time Calculation (min) 51 min    Activity Tolerance Patient tolerated treatment well    Behavior During Therapy [facility] for tasks assessed/performed           Past Medical History:  Diagnosis Date  . Anxiety   . Aortic atherosclerosis ([place])   . Arthritis   . Asthma   . Depression   . Dysphagia   . Dyspnea    on exertion  . Family history of adverse reaction to anesthesia    father's lung collapsed during lumbar surgery  . GERD (gastroesophageal reflux disease)     Past Surgical History:  Procedure Laterality Date  . BREAST CYST ASPIRATION Left   . BREAST EXCISIONAL BIOPSY Left   . BREAST LUMPECTOMY WITH RADIOACTIVE SEED AND SENTINEL LYMPH NODE BIOPSY Left 03/08/2020   Procedure: LEFT BREAST LUMPECTOMY WITH RADIOACTIVE SEED AND LEFT AXILLARY SENTINEL LYMPH NODE BIOPSY;  Surgeon: Rolm Bookbinder, MD;  Location: [facility];  Service: General;  Laterality: Left;  PEC BLOCK  . BREAST SURGERY     benign.  25 yrs ago  . CARPAL TUNNEL RELEASE  1980's   bilateral   . CATARACT EXTRACTION W/PHACO Left 06/12/2016   Procedure: CATARACT EXTRACTION PHACO AND INTRAOCULAR LENS PLACEMENT LEFT EYE KGU5.42;  Surgeon: Tonny Branch, MD;  [facility];  Service: Ophthalmology;  Laterality: Left;  left  . CATARACT EXTRACTION W/PHACO Right 07/24/2016   Procedure: CATARACT EXTRACTION PHACO AND INTRAOCULAR LENS PLACEMENT (IOC);  Surgeon: Tonny Branch, MD;  [facility];  Service: Ophthalmology;  Laterality: Right;   right cde 8.29  . COLONOSCOPY N/A 10/29/2012   Procedure: COLONOSCOPY;  Surgeon: Rogene Houston, MD;  [facility];  Service: Endoscopy;  Laterality: N/A;  140  . Dental implants    . FINGER SURGERY     trigger finger 3rd  bilaterally  . laproscopy     over 20 yrs ago  . NECK SURGERY     2007  . TONSILLECTOMY      There were no vitals filed for this visit.   Subjective Assessment - 04/30/20 1509    Subjective The cording is a whole lot better but it is still there.    Pertinent History L breast cancer, L breast lumpectomy and SLNB (2 both negative) 10/7 ER/PR+/HER2-, previous L breast lumpectomy with lymph node biopsy that turned out to not be cancer    Patient Stated Goals to gain info from provider    Currently in Pain? Yes    Pain Score 4     Pain Location Arm    Pain Orientation Left    Pain Descriptors / Indicators Sharp    Pain Type Acute pain    Pain Onset More than a month ago    Pain Frequency Intermittent    Aggravating Factors  moving    Pain Relieving Factors PT    Effect of Pain on Daily Activities uncomfortable in arm with any  movement                             [facility] Adult PT Treatment/Exercise - 04/30/20 0001      Manual Therapy   Myofascial Release to cording from left wrist to axilla    Manual Lymphatic Drainage (MLD) in supine: short neck, 5 diaphragmatic breaths, left inginal nodes and establishment of axillo inguinal pathway, right axillary nodes and establishment of inter axillary pathway, LUE working proximal to distal then retracing all steps                       PT Long Term Goals - 04/05/20 1347      PT LONG TERM GOAL #1   Title Pt will return to baseline ROM measurements to allow pt to return to PLOF.    Time 8    Period Weeks    Status On-going      PT LONG TERM GOAL #2   Title Pt will demonstrate no cording that is limiting ROM to allow her to return to prior level of function.    Time 4     Period Weeks    Status New    Target Date 05/03/20      PT LONG TERM GOAL #3   Title Pt will be independent in self MLD for management of localized edema.    Time 4    Period Weeks    Status New    Target Date 05/03/20                 Plan - 04/30/20 1600    Clinical Impression Statement Overall pt's cordign is improving but she is still having increased discomfort with L shoulder ROM due to one thick cord extending from axilla to wrist and at least one other thinner cord from axilla to antecubital fossa. This cording improved with myofascial release today but no releases noted today. Her script for a compression sleeve has not been returned signed yet.    PT Frequency 2x / week    PT Duration 4 weeks    PT Treatment/Interventions ADLs/Self Care Home Management;Therapeutic exercise;Patient/family education;Manual lymph drainage;Manual techniques;Orthotic Fit/Training;Passive range of motion    PT Next Visit Plan see if signed script is back, continue MFR to cording, , MLD to L breast and UE and instruct pt    PT Home Exercise Plan post op breast exercises, supine dowel    Consulted and Agree with Plan of Care Patient           Patient will benefit from skilled therapeutic intervention in order to improve the following deficits and impairments:  Postural dysfunction, Decreased knowledge of precautions, Impaired UE functional use, Pain, Increased fascial restricitons, Increased edema, Decreased scar mobility, Decreased range of motion  Visit Diagnosis: Aftercare following surgery for neoplasm  Stiffness of left shoulder, not elsewhere classified     Problem List Patient Active Problem List   Diagnosis Date Noted  . Breast cancer of lower-outer quadrant of left female breast ([place]) 02/15/2020  . Change in stool 10/15/2012  . Dysphagia 03/20/2011  . GERD (gastroesophageal reflux disease) 03/20/2011  . Depression 03/20/2011    Allyson Sabal [facility] 04/30/2020, 4:02  PM  [place], [place], [address] Phone: [phone]   Fax:  [phone]  Name: Dominique Cobb MRN: [id number] Date of Birth: 1945/07/06  Manus Gunning, PT  04/30/20 4:02 PM

## 2020-05-01 DIAGNOSIS — F339 Major depressive disorder, recurrent, unspecified: Secondary | ICD-10-CM | POA: Diagnosis not present

## 2020-05-01 DIAGNOSIS — C50912 Malignant neoplasm of unspecified site of left female breast: Secondary | ICD-10-CM | POA: Diagnosis not present

## 2020-05-07 ENCOUNTER — Ambulatory Visit: Payer: Medicare PPO | Attending: General Surgery | Admitting: Physical Therapy

## 2020-05-07 ENCOUNTER — Other Ambulatory Visit: Payer: Self-pay

## 2020-05-07 ENCOUNTER — Encounter: Payer: Self-pay | Admitting: Physical Therapy

## 2020-05-07 DIAGNOSIS — R600 Localized edema: Secondary | ICD-10-CM | POA: Diagnosis not present

## 2020-05-07 DIAGNOSIS — Z483 Aftercare following surgery for neoplasm: Secondary | ICD-10-CM | POA: Diagnosis not present

## 2020-05-07 DIAGNOSIS — Z170 Estrogen receptor positive status [ER+]: Secondary | ICD-10-CM | POA: Insufficient documentation

## 2020-05-07 DIAGNOSIS — R293 Abnormal posture: Secondary | ICD-10-CM | POA: Diagnosis not present

## 2020-05-07 DIAGNOSIS — M25612 Stiffness of left shoulder, not elsewhere classified: Secondary | ICD-10-CM | POA: Diagnosis not present

## 2020-05-07 DIAGNOSIS — C50412 Malignant neoplasm of upper-outer quadrant of left female breast: Secondary | ICD-10-CM | POA: Diagnosis not present

## 2020-05-07 NOTE — Therapy (Signed)
[place], [place], [address] Phone: [phone]   Fax:  [phone]  Physical Therapy Treatment  Patient Details  Name: Dominique Cobb MRN: [id number] Date of Birth: 07-25-1945 Referring Provider (PT): Donne Hazel   Encounter Date: 05/07/2020   PT End of Session - 05/07/20 1114    Visit Number 7    Number of Visits 15    Date for PT Re-Evaluation 06/04/20    PT Start Time 1008   pt arrived late   PT Stop Time 1103    PT Time Calculation (min) 55 min    Activity Tolerance Patient tolerated treatment well    Behavior During Therapy [facility] for tasks assessed/performed           Past Medical History:  Diagnosis Date  . Anxiety   . Aortic atherosclerosis ([place])   . Arthritis   . Asthma   . Depression   . Dysphagia   . Dyspnea    on exertion  . Family history of adverse reaction to anesthesia    father's lung collapsed during lumbar surgery  . GERD (gastroesophageal reflux disease)     Past Surgical History:  Procedure Laterality Date  . BREAST CYST ASPIRATION Left   . BREAST EXCISIONAL BIOPSY Left   . BREAST LUMPECTOMY WITH RADIOACTIVE SEED AND SENTINEL LYMPH NODE BIOPSY Left 03/08/2020   Procedure: LEFT BREAST LUMPECTOMY WITH RADIOACTIVE SEED AND LEFT AXILLARY SENTINEL LYMPH NODE BIOPSY;  Surgeon: Rolm Bookbinder, MD;  Location: [place];  Service: General;  Laterality: Left;  PEC BLOCK  . BREAST SURGERY     benign.  25 yrs ago  . CARPAL TUNNEL RELEASE  1980's   bilateral   . CATARACT EXTRACTION W/PHACO Left 06/12/2016   Procedure: CATARACT EXTRACTION PHACO AND INTRAOCULAR LENS PLACEMENT LEFT EYE XKG8.18;  Surgeon: Tonny Branch, MD;  [facility];  Service: Ophthalmology;  Laterality: Left;  left  . CATARACT EXTRACTION W/PHACO Right 07/24/2016   Procedure: CATARACT EXTRACTION PHACO AND INTRAOCULAR LENS PLACEMENT (IOC);  Surgeon: Tonny Branch, MD;  [facility];  Service: Ophthalmology;   Laterality: Right;  right cde 8.29  . COLONOSCOPY N/A 10/29/2012   Procedure: COLONOSCOPY;  Surgeon: Rogene Houston, MD;  [facility];  Service: Endoscopy;  Laterality: N/A;  140  . Dental implants    . FINGER SURGERY     trigger finger 3rd  bilaterally  . laproscopy     over 20 yrs ago  . NECK SURGERY     2007  . TONSILLECTOMY      There were no vitals filed for this visit.   Subjective Assessment - 05/07/20 1010    Subjective The cording is still improving but my arm is swelling.    Pertinent History L breast cancer, L breast lumpectomy and SLNB (2 both negative) 10/7 ER/PR+/HER2-, previous L breast lumpectomy with lymph node biopsy that turned out to not be cancer    Patient Stated Goals to gain info from provider    Currently in Pain? No/denies    Pain Score 0-No pain                             [facility] Adult PT Treatment/Exercise - 05/07/20 0001      Manual Therapy   Myofascial Release to cording from left wrist to axilla    Manual Lymphatic Drainage (MLD) in supine: short neck, 5 diaphragmatic breaths, left inginal  nodes and establishment of axillo inguinal pathway, right axillary nodes and establishment of inter axillary pathway, LUE working proximal to distal then retracing all steps                       PT Long Term Goals - 05/07/20 1115      PT LONG TERM GOAL #1   Title Pt will return to baseline ROM measurements to allow pt to return to PLOF.    Time 8    Period Weeks    Status Achieved      PT LONG TERM GOAL #2   Title Pt will demonstrate no cording that is limiting ROM to allow her to return to prior level of function.    Baseline 12/6- pt is still demonstrating cording though the number of cords is much less but there is still a very thick cord from axilla to wrist    Time 4    Period Weeks    Status On-going      PT LONG TERM GOAL #3   Title Pt will be independent in self MLD for management of localized edema.      Time 4    Period Weeks    Status On-going      PT LONG TERM GOAL #4   Title Pt will obtain a compression sleeve and glove for long term managment of LUE edema.    Time 4    Period Weeks    Status New    Target Date 06/04/20                 Plan - 05/07/20 1116    Clinical Impression Statement Pt demonstrates less cording from axilla to wrist but cording does still remain. She has a very thick cord that causes discomfort from axilla to wrist. Continued to work on this during session. Pt still presents with visible swelling throughout L UE. Her signed script has not been returned from the doctor for a compression sleeve so re sent it today. Pt would benefit from adidtional skilled PT services to decrease L UE swelling, assist pt with obtaining compression garments and decrease cording.    PT Frequency 2x / week    PT Duration 4 weeks    PT Treatment/Interventions ADLs/Self Care Home Management;Therapeutic exercise;Patient/family education;Manual lymph drainage;Manual techniques;Orthotic Fit/Training;Passive range of motion    PT Next Visit Plan see if signed script is back, continue MFR to cording, , MLD to L breast and UE and instruct pt and issued handout    PT Home Exercise Plan post op breast exercises, supine dowel    Consulted and Agree with Plan of Care Patient           Patient will benefit from skilled therapeutic intervention in order to improve the following deficits and impairments:  Postural dysfunction, Decreased knowledge of precautions, Impaired UE functional use, Pain, Increased fascial restricitons, Increased edema, Decreased scar mobility, Decreased range of motion  Visit Diagnosis: Aftercare following surgery for neoplasm  Localized edema  Stiffness of left shoulder, not elsewhere classified  Abnormal posture  Malignant neoplasm of upper-outer quadrant of left breast in female, estrogen receptor positive ([place])     Problem List Patient Active  Problem List   Diagnosis Date Noted  . Breast cancer of lower-outer quadrant of left female breast ([place]) 02/15/2020  . Change in stool 10/15/2012  . Dysphagia 03/20/2011  . GERD (gastroesophageal reflux disease) 03/20/2011  . Depression 03/20/2011  Allyson Sabal [facility] 05/07/2020, 11:19 AM  [place], [place], [address] Phone: [phone]   Fax:  [phone]  Name: Dominique Cobb MRN: [id number] Date of Birth: December 26, 1945  Check all possible CPT codes: 12258- Therapeutic Exercise, 97140 - Manual Therapy and 34621 - [place], PT 05/07/20 11:20 AM

## 2020-05-08 ENCOUNTER — Telehealth: Payer: Self-pay | Admitting: Internal Medicine

## 2020-05-08 DIAGNOSIS — J3489 Other specified disorders of nose and nasal sinuses: Secondary | ICD-10-CM

## 2020-05-08 MED ORDER — FLUTICASONE PROPIONATE 50 MCG/ACT NA SUSP: 1.0000 | Freq: Every day | NASAL | 3 refills | Status: DC

## 2020-05-08 NOTE — Telephone Encounter (Signed)
05/08/2020  Attempted to reach the patient.  Left detailed voicemail.  We will go ahead and send in [facility] prescription as requested.  We will also route message today to Dr. Chase Caller to let him know that patient is not currently having any symptoms from current medication.  Nothing further needed.  Left additional detailed message that if patient has additional concerns or needs additional help to call our office back: [phone]  Iwao Shamblin, FNP

## 2020-05-11 NOTE — Telephone Encounter (Signed)
Thanks Aaron Edelman. Will close message. Reply not needed

## 2020-05-14 ENCOUNTER — Ambulatory Visit: Payer: Medicare PPO

## 2020-05-14 ENCOUNTER — Other Ambulatory Visit: Payer: Self-pay

## 2020-05-14 DIAGNOSIS — C50412 Malignant neoplasm of upper-outer quadrant of left female breast: Secondary | ICD-10-CM

## 2020-05-14 DIAGNOSIS — M25612 Stiffness of left shoulder, not elsewhere classified: Secondary | ICD-10-CM

## 2020-05-14 DIAGNOSIS — Z483 Aftercare following surgery for neoplasm: Secondary | ICD-10-CM

## 2020-05-14 DIAGNOSIS — R293 Abnormal posture: Secondary | ICD-10-CM

## 2020-05-14 DIAGNOSIS — R600 Localized edema: Secondary | ICD-10-CM

## 2020-05-14 DIAGNOSIS — Z170 Estrogen receptor positive status [ER+]: Secondary | ICD-10-CM | POA: Diagnosis not present

## 2020-05-14 NOTE — Patient Instructions (Signed)
Deep Effective Breath   Standing, sitting, or laying down, place both hands on the belly. Take a deep breath IN, expanding the belly; then breath OUT, contracting the belly. Repeat __5__ times. Do __2-3__ sessions per day and before your self massage.  http://gt2.exer.us/866   Copyright  VHI. All rights reserved.  Axilla to Axilla - Sweep   On uninvolved side make 5 circles in the armpit, then pump _5__ times from involved armpit across chest to uninvolved armpit, making a pathway. Do _1__ time per day.  Copyright  VHI. All rights reserved.  Axilla to Inguinal Nodes - Sweep   On involved side, make 5 circles at groin at panty line, then pump _5__ times from armpit along side of trunk to outer hip, making your other pathway. Do __1_ time per day.  Copyright  VHI. All rights reserved.  Arm Posterior: Elbow to Shoulder - Sweep   Pump _5__ times from back of elbow to top of shoulder. Then inner to outer upper arm _5_ times, then outer arm again _5_ times. Then back to the pathways _2-3_ times. Do _1__ time per day.  Copyright  VHI. All rights reserved.  ARM: [place] Wrist to Elbow - Sweep   Pump or stationary circles _5__ times from wrist to elbow making sure to do both sides of the forearm. Then retrace your steps to the outer arm, and the pathways _2-3_ times each. Do _1__ time per day.  Copyright  VHI. All rights reserved.  ARM: Dorsum of Hand to Shoulder - Sweep   Pump or stationary circles _5__ times on back of hand including knuckle spaces and individual fingers if needed working up towards the wrist, then retrace all your steps working back up the forearm, doing both sides; upper outer arm and back to your pathways _2-3_ times each. Then do 5 circles again at uninvolved armpit and involved groin where you started! Good job!! Do __1_ time per day.

## 2020-05-14 NOTE — Therapy (Signed)
[place], [place], [address] Phone: [phone]   Fax:  [phone]  Physical Therapy Treatment  Patient Details  Name: Dominique Cobb MRN: [id number] Date of Birth: [birth year] Referring Provider (PT): Ave Filter Date: 05/14/2020   PT End of Session - 05/14/20 1508    Visit Number 8    Number of Visits 15    Date for PT Re-Evaluation 06/04/20    PT Start Time 1410    PT Stop Time 1504    PT Time Calculation (min) 54 min    Activity Tolerance Patient tolerated treatment well    Behavior During Therapy [facility] for tasks assessed/performed           Past Medical History:  Diagnosis Date  . Anxiety   . Aortic atherosclerosis ([place])   . Arthritis   . Asthma   . Depression   . Dysphagia   . Dyspnea    on exertion  . Family history of adverse reaction to anesthesia    father's lung collapsed during lumbar surgery  . GERD (gastroesophageal reflux disease)     Past Surgical History:  Procedure Laterality Date  . BREAST CYST ASPIRATION Left   . BREAST EXCISIONAL BIOPSY Left   . BREAST LUMPECTOMY WITH RADIOACTIVE SEED AND SENTINEL LYMPH NODE BIOPSY Left 03/08/2020   Procedure: LEFT BREAST LUMPECTOMY WITH RADIOACTIVE SEED AND LEFT AXILLARY SENTINEL LYMPH NODE BIOPSY;  Surgeon: Rolm Bookbinder, MD;  Location: [place];  Service: General;  Laterality: Left;  PEC BLOCK  . BREAST SURGERY     benign.  25 yrs ago  . CARPAL TUNNEL RELEASE  1980's   bilateral   . CATARACT EXTRACTION W/PHACO Left 06/12/2016   Procedure: CATARACT EXTRACTION PHACO AND INTRAOCULAR LENS PLACEMENT LEFT EYE MKL4.91;  Surgeon: Tonny Branch, MD;  [facility];  Service: Ophthalmology;  Laterality: Left;  left  . CATARACT EXTRACTION W/PHACO Right 07/24/2016   Procedure: CATARACT EXTRACTION PHACO AND INTRAOCULAR LENS PLACEMENT (IOC);  Surgeon: Tonny Branch, MD;  [facility];  Service: Ophthalmology;  Laterality: Right;   right cde 8.29  . COLONOSCOPY N/A 10/29/2012   Procedure: COLONOSCOPY;  Surgeon: Rogene Houston, MD;  [facility];  Service: Endoscopy;  Laterality: N/A;  140  . Dental implants    . FINGER SURGERY     trigger finger 3rd  bilaterally  . laproscopy     over 20 yrs ago  . NECK SURGERY     2007  . TONSILLECTOMY      There were no vitals filed for this visit.   Subjective Assessment - 05/14/20 1412    Subjective I feel like I'm doing much better and may be ready to D/C.    Pertinent History L breast cancer, L breast lumpectomy and SLNB (2 both negative) 10/7 ER/PR+/HER2-, previous L breast lumpectomy with lymph node biopsy that turned out to not be cancer    Patient Stated Goals to gain info from provider    Currently in Pain? No/denies                             [facility] Adult PT Treatment/Exercise - 05/14/20 0001      Manual Therapy   Manual Lymphatic Drainage (MLD) in supine: short neck, 5 diaphragmatic breaths, left inginal nodes and establishment of axillo inguinal pathway, right axillary nodes and establishment of inter axillary pathway, LUE working proximal to  distal then retracing all steps instructing pt and having her return demo                  PT Education - 05/14/20 1715    Education Details Self MLD    Person(s) Educated Patient    Methods Explanation;Demonstration;Handout    Comprehension Verbalized understanding;Returned demonstration;Need further instruction               PT Long Term Goals - 05/07/20 1115      PT LONG TERM GOAL #1   Title Pt will return to baseline ROM measurements to allow pt to return to PLOF.    Time 8    Period Weeks    Status Achieved      PT LONG TERM GOAL #2   Title Pt will demonstrate no cording that is limiting ROM to allow her to return to prior level of function.    Baseline 12/6- pt is still demonstrating cording though the number of cords is much less but there is still a very thick  cord from axilla to wrist    Time 4    Period Weeks    Status On-going      PT LONG TERM GOAL #3   Title Pt will be independent in self MLD for management of localized edema.    Time 4    Period Weeks    Status On-going      PT LONG TERM GOAL #4   Title Pt will obtain a compression sleeve and glove for long term managment of LUE edema.    Time 4    Period Weeks    Status New    Target Date 06/04/20                 Plan - 05/14/20 1508    Clinical Impression Statement Pt comes in reporting she feels she has made great improvements over past week. She notes 1 cord still present but that it isn't limiting her any longer with ADLs. Was very mildly noticeable at very end ROM in axilla during P/ROM, which was full today. Pt was also instructed in self MLD while returning therapist demonstration throughout session today. Hand over hand pressure required for to instruct in correct light pressure and to stretch skin, not slide. Also issued script from Dr. Donne Hazel for compression sleeve and glove that pt would like to get to wear prophylactically. Educated her on best times to wear this being when exercising or increasing HR with other ADLs. Pt verbalized understanding and reports willcall back to potentially cancel some upcoming appts and possibly just come for 1-2 more visits for review of self MLD.    Personal Factors and Comorbidities Comorbidity 1    Comorbidities previous L breast biopsy with nodes biopsied but was not cancer, hx of R rotator cuff surgery    Stability/Clinical Decision Making Stable/Uncomplicated    Rehab Potential Good    PT Frequency 2x / week    PT Duration 4 weeks    PT Treatment/Interventions ADLs/Self Care Home Management;Therapeutic exercise;Patient/family education;Manual lymph drainage;Manual techniques;Orthotic Fit/Training;Passive range of motion    PT Next Visit Plan MLD to L breast and UE and review same with pt working towards D/C    PT Home Exercise  Plan post op breast exercises, supine dowel, self MLD    Consulted and Agree with Plan of Care Patient           Patient will benefit from skilled therapeutic intervention in  order to improve the following deficits and impairments:  Postural dysfunction,Decreased knowledge of precautions,Impaired UE functional use,Pain,Increased fascial restricitons,Increased edema,Decreased scar mobility,Decreased range of motion  Visit Diagnosis: Aftercare following surgery for neoplasm  Localized edema  Stiffness of left shoulder, not elsewhere classified  Abnormal posture  Malignant neoplasm of upper-outer quadrant of left breast in female, estrogen receptor positive ([place])     Problem List Patient Active Problem List   Diagnosis Date Noted  . Breast cancer of lower-outer quadrant of left female breast ([place]) 02/15/2020  . Change in stool 10/15/2012  . Dysphagia 03/20/2011  . GERD (gastroesophageal reflux disease) 03/20/2011  . Depression 03/20/2011    Otelia Limes, PTA 05/14/2020, 5:16 PM  [place], [place], [address] Phone: [phone]   Fax:  [phone]  Name: Dominique Cobb MRN: [id number] Date of Birth: 05-10-1946

## 2020-05-21 ENCOUNTER — Other Ambulatory Visit: Payer: Self-pay

## 2020-05-21 ENCOUNTER — Ambulatory Visit: Payer: Medicare PPO | Admitting: Physical Therapy

## 2020-05-21 ENCOUNTER — Encounter: Payer: Self-pay | Admitting: Physical Therapy

## 2020-05-21 DIAGNOSIS — R293 Abnormal posture: Secondary | ICD-10-CM | POA: Diagnosis not present

## 2020-05-21 DIAGNOSIS — M25612 Stiffness of left shoulder, not elsewhere classified: Secondary | ICD-10-CM | POA: Diagnosis not present

## 2020-05-21 DIAGNOSIS — R600 Localized edema: Secondary | ICD-10-CM

## 2020-05-21 DIAGNOSIS — Z483 Aftercare following surgery for neoplasm: Secondary | ICD-10-CM | POA: Diagnosis not present

## 2020-05-21 DIAGNOSIS — Z170 Estrogen receptor positive status [ER+]: Secondary | ICD-10-CM | POA: Diagnosis not present

## 2020-05-21 DIAGNOSIS — C50412 Malignant neoplasm of upper-outer quadrant of left female breast: Secondary | ICD-10-CM | POA: Diagnosis not present

## 2020-05-21 NOTE — Therapy (Addendum)
 [facility] [address] [place], [place], [address] Phone: [phone]   Fax:  [phone]  Physical Therapy Treatment  Patient Details  Name: Dominique Cobb MRN: [id number] Date of Birth: 02/19/1946 Referring Provider (PT): Ebbie   Encounter Date: 05/21/2020   PT End of Session - 05/21/20 1657     Visit Number 9    Number of Visits 15    Date for PT Re-Evaluation 06/04/20    PT Start Time 1621   pt arrived late   PT Stop Time 1657    PT Time Calculation (min) 36 min    Activity Tolerance Patient tolerated treatment well    Behavior During Therapy [facility] for tasks assessed/performed             Past Medical History:  Diagnosis Date   Anxiety    Aortic atherosclerosis ([facility])    Arthritis    Asthma    Depression    Dysphagia    Dyspnea    on exertion   Family history of adverse reaction to anesthesia    father's lung collapsed during lumbar surgery   GERD (gastroesophageal reflux disease)     Past Surgical History:  Procedure Laterality Date   BREAST CYST ASPIRATION Left    BREAST EXCISIONAL BIOPSY Left    BREAST LUMPECTOMY WITH RADIOACTIVE SEED AND SENTINEL LYMPH NODE BIOPSY Left 03/08/2020   Procedure: LEFT BREAST LUMPECTOMY WITH RADIOACTIVE SEED AND LEFT AXILLARY SENTINEL LYMPH NODE BIOPSY;  Surgeon: Ebbie Cough, MD;  [facility];  Service: General;  Laterality: Left;  PEC BLOCK   BREAST SURGERY     benign.  25 yrs ago   CARPAL TUNNEL RELEASE  1980's   bilateral    CATARACT EXTRACTION W/PHACO Left 06/12/2016   Procedure: CATARACT EXTRACTION PHACO AND INTRAOCULAR LENS PLACEMENT LEFT EYE RIZ1.57;  Surgeon: Cherene Mania, MD;  [facility];  Service: Ophthalmology;  Laterality: Left;  left   CATARACT EXTRACTION W/PHACO Right 07/24/2016   Procedure: CATARACT EXTRACTION PHACO AND INTRAOCULAR LENS PLACEMENT (IOC);  Surgeon: Cherene Mania, MD;  [facility];  Service: Ophthalmology;  Laterality:  Right;  right cde 8.29   COLONOSCOPY N/A 10/29/2012   Procedure: COLONOSCOPY;  Surgeon: Claudis RAYMOND Rivet, MD;  [facility];  Service: Endoscopy;  Laterality: N/A;  140   Dental implants     FINGER SURGERY     trigger finger 3rd  bilaterally   laproscopy     over 20 yrs ago   NECK SURGERY     2007   TONSILLECTOMY      There were no vitals filed for this visit.   Subjective Assessment - 05/21/20 1623     Subjective I feel good. I don't detect any cording in the movement.    Pertinent History L breast cancer, L breast lumpectomy and SLNB (2 both negative) 10/7 ER/PR+/HER2-, previous L breast lumpectomy with lymph node biopsy that turned out to not be cancer    Patient Stated Goals to gain info from provider    Currently in Pain? No/denies    Pain Score 0-No pain                               [facility] Adult PT Treatment/Exercise - 05/21/20 0001       Manual Therapy   Myofascial Release briefly to cording from left wrist to axilla only 1 cord palpable  Manual Lymphatic Drainage (MLD) in supine: short neck, 5 diaphragmatic breaths, left inginal nodes and establishment of axillo inguinal pathway, right axillary nodes and establishment of inter axillary pathway, LUE working proximal to distal then retracing all steps                         PT Long Term Goals - 05/07/20 1115       PT LONG TERM GOAL #1   Title Pt will return to baseline ROM measurements to allow pt to return to PLOF.    Time 8    Period Weeks    Status Achieved      PT LONG TERM GOAL #2   Title Pt will demonstrate no cording that is limiting ROM to allow her to return to prior level of function.    Baseline 12/6- pt is still demonstrating cording though the number of cords is much less but there is still a very thick cord from axilla to wrist    Time 4    Period Weeks    Status On-going      PT LONG TERM GOAL #3   Title Pt will be independent in self MLD for  management of localized edema.    Time 4    Period Weeks    Status On-going      PT LONG TERM GOAL #4   Title Pt will obtain a compression sleeve and glove for long term managment of LUE edema.    Time 4    Period Weeks    Status New    Target Date 06/04/20                   Plan - 05/21/20 1657     Clinical Impression Statement Pt was running late for session today so session was shorter. Plan was to have pt return demonstrate self MLD but since session was shorter therapist performed MLD and some myofascial release to one cord which remains and is palpable at forearm. Plan will be to instruct pt in self MLD at next session and have her return demosntrate and once she is independent with this she will be ready for discharge.    PT Frequency 2x / week    PT Duration 4 weeks    PT Treatment/Interventions ADLs/Self Care Home Management;Therapeutic exercise;Patient/family education;Manual lymph drainage;Manual techniques;Orthotic Fit/Training;Passive range of motion    PT Next Visit Plan see if pt got sleeve adn glove, MLD to L breast and UE and review same with pt working towards D/C    PT Home Exercise Plan post op breast exercises, supine dowel, self MLD    Consulted and Agree with Plan of Care Patient             Patient will benefit from skilled therapeutic intervention in order to improve the following deficits and impairments:  Postural dysfunction,Decreased knowledge of precautions,Impaired UE functional use,Pain,Increased fascial restricitons,Increased edema,Decreased scar mobility,Decreased range of motion  Visit Diagnosis: Aftercare following surgery for neoplasm  Localized edema     Problem List Patient Active Problem List   Diagnosis Date Noted   Breast cancer of lower-outer quadrant of left female breast ([facility]) 02/15/2020   Change in stool 10/15/2012   Dysphagia 03/20/2011   GERD (gastroesophageal reflux disease) 03/20/2011   Depression 03/20/2011     Dominique Cobb 05/21/2020, 4:59 PM  [facility] [address] [place], [place], [address] Phone: [phone]  Fax:  [phone]  Name: Dominique Cobb MRN: [id number] Date of Birth: March 31, 1946  Dominique Cobb, PT 05/21/20 4:59 PM  PHYSICAL THERAPY DISCHARGE SUMMARY  Visits from Start of Care: 9  Current functional level related to goals / functional outcomes: See above   Remaining deficits: See above   Education / Equipment: See above   Patient agrees to discharge. Patient goals were partially met. Patient is being discharged due to not returning since the last visit.  [facility] [place], [place] 06/29/24 11:13 AM

## 2020-05-22 ENCOUNTER — Other Ambulatory Visit: Payer: Self-pay | Admitting: Internal Medicine

## 2020-05-24 ENCOUNTER — Encounter: Payer: Medicare PPO | Admitting: Physical Therapy

## 2020-06-07 DIAGNOSIS — H608X3 Other otitis externa, bilateral: Secondary | ICD-10-CM | POA: Diagnosis not present

## 2020-06-07 DIAGNOSIS — H903 Sensorineural hearing loss, bilateral: Secondary | ICD-10-CM | POA: Diagnosis not present

## 2020-06-11 ENCOUNTER — Ambulatory Visit: Payer: Medicare PPO | Attending: General Surgery | Admitting: Physical Therapy

## 2020-06-11 ENCOUNTER — Other Ambulatory Visit: Payer: Self-pay

## 2020-06-11 DIAGNOSIS — Z483 Aftercare following surgery for neoplasm: Secondary | ICD-10-CM | POA: Insufficient documentation

## 2020-06-11 NOTE — Therapy (Signed)
[place], [place], [address] Phone: [phone]   Fax:  [phone]  Physical Therapy Treatment  Patient Details  Name: Dominique Cobb MRN: [id number] Date of Birth: 1945/09/06 Referring Provider (PT): Ave Filter Date: 06/11/2020   PT End of Session - 06/11/20 1514    Visit Number 9    Number of Visits 15    Date for PT Re-Evaluation 06/04/20    PT Start Time 1505    PT Stop Time 1510    PT Time Calculation (min) 5 min           Past Medical History:  Diagnosis Date  . Anxiety   . Aortic atherosclerosis ([place])   . Arthritis   . Asthma   . Depression   . Dysphagia   . Dyspnea    on exertion  . Family history of adverse reaction to anesthesia    father's lung collapsed during lumbar surgery  . GERD (gastroesophageal reflux disease)     Past Surgical History:  Procedure Laterality Date  . BREAST CYST ASPIRATION Left   . BREAST EXCISIONAL BIOPSY Left   . BREAST LUMPECTOMY WITH RADIOACTIVE SEED AND SENTINEL LYMPH NODE BIOPSY Left 03/08/2020   Procedure: LEFT BREAST LUMPECTOMY WITH RADIOACTIVE SEED AND LEFT AXILLARY SENTINEL LYMPH NODE BIOPSY;  Surgeon: Rolm Bookbinder, MD;  Location: [facility];  Service: General;  Laterality: Left;  PEC BLOCK  . BREAST SURGERY     benign.  25 yrs ago  . CARPAL TUNNEL RELEASE  1980's   bilateral   . CATARACT EXTRACTION W/PHACO Left 06/12/2016   Procedure: CATARACT EXTRACTION PHACO AND INTRAOCULAR LENS PLACEMENT LEFT EYE KZS0.10;  Surgeon: Tonny Branch, MD;  Location: [facility];  Service: Ophthalmology;  Laterality: Left;  left  . CATARACT EXTRACTION W/PHACO Right 07/24/2016   Procedure: CATARACT EXTRACTION PHACO AND INTRAOCULAR LENS PLACEMENT (IOC);  Surgeon: Tonny Branch, MD;  Location: [facility];  Service: Ophthalmology;  Laterality: Right;  right cde 8.29  . COLONOSCOPY N/A 10/29/2012   Procedure: COLONOSCOPY;  Surgeon: Rogene Houston, MD;  Location: [facility];  Service: Endoscopy;  Laterality: N/A;  140  . Dental implants    . FINGER SURGERY     trigger finger 3rd  bilaterally  . laproscopy     over 20 yrs ago  . NECK SURGERY     2007  . TONSILLECTOMY      There were no vitals filed for this visit.   Subjective Assessment - 06/11/20 1514    Subjective Pt here for SOZO screen                  L-DEX FLOWSHEETS - 06/11/20 1500      L-DEX LYMPHEDEMA SCREENING   Measurement Type Unilateral    L-DEX MEASUREMENT EXTREMITY Upper Extremity    POSITION  Standing    DOMINANT SIDE Right    At Risk Side Left    BASELINE SCORE (UNILATERAL) 2.2    L-DEX SCORE (UNILATERAL) 3                                  PT Long Term Goals - 05/07/20 1115      PT LONG TERM GOAL #1   Title Pt will return to baseline ROM measurements to allow pt to return to PLOF.    Time 8    Period Weeks  Status Achieved      PT LONG TERM GOAL #2   Title Pt will demonstrate no cording that is limiting ROM to allow her to return to prior level of function.    Baseline 12/6- pt is still demonstrating cording though the number of cords is much less but there is still a very thick cord from axilla to wrist    Time 4    Period Weeks    Status On-going      PT LONG TERM GOAL #3   Title Pt will be independent in self MLD for management of localized edema.    Time 4    Period Weeks    Status On-going      PT LONG TERM GOAL #4   Title Pt will obtain a compression sleeve and glove for long term managment of LUE edema.    Time 4    Period Weeks    Status New    Target Date 06/04/20                 Plan - 06/11/20 1514    Clinical Impression Statement Pt measured within recommended range of baseline           Patient will benefit from skilled therapeutic intervention in order to improve the following deficits and impairments:     Visit Diagnosis: Aftercare following surgery for neoplasm     Problem  List Patient Active Problem List   Diagnosis Date Noted  . Breast cancer of lower-outer quadrant of left female breast ([place]) 02/15/2020  . Change in stool 10/15/2012  . Dysphagia 03/20/2011  . GERD (gastroesophageal reflux disease) 03/20/2011  . Depression 03/20/2011   Donato Heinz. Owens Shark PT  Norwood Levo 06/11/2020, 3:16 PM  [place], [place], [address] Phone: [phone]   Fax:  [phone]  Name: Dominique Cobb MRN: [id number] Date of Birth: April 14, 1946

## 2020-06-14 ENCOUNTER — Encounter: Payer: Medicare PPO | Admitting: Physical Therapy

## 2020-06-19 ENCOUNTER — Encounter: Payer: Medicare PPO | Admitting: Physical Therapy

## 2020-07-12 ENCOUNTER — Other Ambulatory Visit: Payer: Self-pay | Admitting: Internal Medicine

## 2020-07-16 ENCOUNTER — Telehealth: Payer: Self-pay | Admitting: Internal Medicine

## 2020-07-16 NOTE — Telephone Encounter (Signed)
Patient has not been seen in office since 06/2018 so she was made an appointment to see Derl Barrow NP on 07/20/20 at 2pm  Nothing further needed at this time

## 2020-07-20 ENCOUNTER — Ambulatory Visit: Payer: Medicare PPO | Admitting: Primary Care

## 2020-07-20 ENCOUNTER — Ambulatory Visit (INDEPENDENT_AMBULATORY_CARE_PROVIDER_SITE_OTHER): Payer: Medicare PPO

## 2020-07-20 ENCOUNTER — Encounter: Payer: Self-pay | Admitting: Primary Care

## 2020-07-20 ENCOUNTER — Other Ambulatory Visit: Payer: Self-pay

## 2020-07-20 VITALS — BP 118/72 | HR 67 | Temp 97.1°F | Ht 64.5 in | Wt 137.0 lb

## 2020-07-20 DIAGNOSIS — J449 Chronic obstructive pulmonary disease, unspecified: Secondary | ICD-10-CM

## 2020-07-20 DIAGNOSIS — R0602 Shortness of breath: Secondary | ICD-10-CM | POA: Diagnosis not present

## 2020-07-20 DIAGNOSIS — J432 Centrilobular emphysema: Secondary | ICD-10-CM | POA: Diagnosis not present

## 2020-07-20 DIAGNOSIS — J4530 Mild persistent asthma, uncomplicated: Secondary | ICD-10-CM | POA: Diagnosis not present

## 2020-07-20 DIAGNOSIS — R0600 Dyspnea, unspecified: Secondary | ICD-10-CM | POA: Diagnosis not present

## 2020-07-20 DIAGNOSIS — R918 Other nonspecific abnormal finding of lung field: Secondary | ICD-10-CM

## 2020-07-20 DIAGNOSIS — J45909 Unspecified asthma, uncomplicated: Secondary | ICD-10-CM | POA: Diagnosis not present

## 2020-07-20 MED ORDER — PULMICORT FLEXHALER 180 MCG/ACT IN AEPB: 2.0000 | INHALATION_SPRAY | Freq: Two times a day (BID) | RESPIRATORY_TRACT | 0 refills | Status: DC

## 2020-07-20 NOTE — Patient Instructions (Addendum)
CT chest June 2020- small subpleural nodules bilaterally measuring 2-63mm considered benign, no dedicated follow-up necessary.   Pulmonary function testing in 2019 did not show overt obstructive lung disease FENO was elevated indicating an allergic asthmatic component to breathing   Orders: CXR today re: emphysema (ordered) PFTs in 1 year (ordered)  Follow-up: 1 year with Dr. Chase Caller and PFTs / or sooner if respiratory symptoms worsen

## 2020-07-20 NOTE — Progress Notes (Signed)
@Patient  ID: Dominique Cobb, female    DOB: July 30, 1945, 75 y.o.   MRN: [id number]  Chief Complaint  Patient presents with  . Follow-up    Doing well    Referring provider: Josetta Huddle, MD  HPI: 75 year old female, former smoker quit in 2006 (80 pack year hx). PMH significant for asthma, pulmonary nodules, emphysema. Patient of Dr. Chase Caller, last seen in office on 06/10/18. Maintained on Pulmicort 167mcg, albuterol hfa and flonase nasal spray.   07/20/2020 Patient presents today for overdue follow-up for asthma. She is needing inhaler medication refills. She is complaint with pulmicort inhaler. If she misses Pulmicort dose she does notice a little more wheezing in the morning. She has not require Ventolin rescue inhaler. She had a wood stove at home but this has been removed. CT chest in June 2020 showed biapical pleural-parenchymal scarring, mild centrilobular and paraseptal emphysema and small subpleural nodules bilaterally measuring 2-43mm and considered benign.    Pulmonary testing: PFTs- FVC 2.78 (106%), FEV1 2.14 (109%), ratio 77, DLCOunc 19.10 (88%), no BD response  Feno 09/04/2017 - 29 ppb (on prednsione) ->  Repeat FeNO 10/16/2017 ->  59   Imaging: CT chest June 2020- small subpleural nodules bilaterally measuring 2-35mm considered benign, no dedicated  follow-up necessary.    Allergies  Allergen Reactions  . Codeine Other (See Comments)    Sweating and Patient just doesn't like.     Immunization History  Administered Date(s) Administered  . Influenza Split 02/07/2020  . Influenza, High Dose Seasonal PF 03/02/2017, 03/04/2018, 02/01/2019  . Influenza-Unspecified 02/03/2019  . Moderna Sars-Covid-2 Vaccination 04/04/2020  . PFIZER(Purple Top)SARS-COV-2 Vaccination 07/20/2019, 08/10/2019  . Zoster Recombinat (Shingrix) 05/17/2018    Past Medical History:  Diagnosis Date  . Anxiety   . Aortic atherosclerosis ([place])   . Arthritis   . Asthma   . Depression   .  Dysphagia   . Dyspnea    on exertion  . Family history of adverse reaction to anesthesia    father's lung collapsed during lumbar surgery  . GERD (gastroesophageal reflux disease)     Tobacco History: Social History   Tobacco Use  Smoking Status Former Smoker  . Packs/day: 2.00  . Years: 40.00  . Pack years: 80.00  . Types: Cigarettes  . Quit date: 09/04/2004  . Years since quitting: 15.8  Smokeless Tobacco Never Used   Counseling given: Not Answered   Outpatient Medications Prior to Visit  Medication Sig Dispense Refill  . albuterol (VENTOLIN HFA) 108 (90 Base) MCG/ACT inhaler Inhale 2 puffs into the lungs every 6 (six) hours as needed for wheezing or shortness of breath. 1 Inhaler 6  . cycloSPORINE (RESTASIS) 0.05 % ophthalmic emulsion Place 1 drop into both eyes 2 (two) times daily.    Marland Kitchen escitalopram (LEXAPRO) 10 MG tablet Take 5 mg by mouth 2 (two) times daily.     Marland Kitchen HYDROcodone-acetaminophen (NORCO) 10-325 MG tablet Take 1 tablet by mouth every 6 (six) hours as needed. 10 tablet 0  . ibuprofen (ADVIL) 400 MG tablet Take 400 mg by mouth daily as needed for moderate pain.     . Multiple Vitamin (MULTIVITAMIN WITH MINERALS) TABS tablet Take 1 tablet by mouth daily.    . rosuvastatin (CRESTOR) 5 MG tablet Take 5 mg by mouth 3 (three) times a week.    . tamoxifen (NOLVADEX) 20 MG tablet Take 1 tablet (20 mg total) by mouth daily. 90 tablet 3  . Turmeric 500 MG CAPS Take  500 mg by mouth daily.     Marland Kitchen zolpidem (AMBIEN) 5 MG tablet Take 5 mg by mouth at bedtime as needed for sleep.     . budesonide (PULMICORT FLEXHALER) 180 MCG/ACT inhaler Inhale 2 puffs into the lungs in the morning and at bedtime. 1 each 0  . buPROPion (WELLBUTRIN SR) 200 MG 12 hr tablet Take 200 mg by mouth 2 (two) times daily.     . fluticasone (FLONASE) 50 MCG/ACT nasal spray Place 1 spray into both nostrils daily. (Patient not taking: Reported on 07/20/2020) 16 g 3  . Omega-3 Fatty Acids (FISH OIL) 1000 MG CAPS  Take 1,000 mg by mouth daily.  (Patient not taking: Reported on 07/20/2020)     No facility-administered medications prior to visit.   Review of Systems  Review of Systems  Constitutional: Negative.   Respiratory: Negative.  Negative for cough and shortness of breath.   Cardiovascular: Negative.    Physical Exam  BP 118/72 (BP Location: Right Arm, Cuff Size: Normal)   Pulse 67   Temp (!) 97.1 F (36.2 C) (Oral)   Ht 5' 4.5" (1.638 m)   Wt 137 lb (62.1 kg)   SpO2 95%   BMI 23.15 kg/m  Physical Exam Constitutional:      Appearance: Normal appearance.  HENT:     Mouth/Throat:     Mouth: Mucous membranes are moist.     Pharynx: Oropharynx is clear.  Cardiovascular:     Rate and Rhythm: Normal rate and regular rhythm.  Pulmonary:     Effort: Pulmonary effort is normal.     Breath sounds: Normal breath sounds.  Musculoskeletal:        General: Normal range of motion.  Skin:    General: Skin is warm and dry.  Neurological:     General: No focal deficit present.     Mental Status: She is alert and oriented to person, place, and time. Mental status is at baseline.  Psychiatric:        Mood and Affect: Mood normal.        Behavior: Behavior normal.        Thought Content: Thought content normal.        Judgment: Judgment normal.      Lab Results:  CBC    Component Value Date/Time   WBC 5.6 03/06/2020 1159   RBC 4.59 03/06/2020 1159   HGB 14.5 03/06/2020 1159   HCT 45.9 03/06/2020 1159   PLT 209 03/06/2020 1159   MCV 100.0 03/06/2020 1159   MCH 31.6 03/06/2020 1159   MCHC 31.6 03/06/2020 1159   RDW 12.1 03/06/2020 1159    BMET    Component Value Date/Time   NA 142 03/06/2020 1159   K 4.1 03/06/2020 1159   CL 103 03/06/2020 1159   CO2 29 03/06/2020 1159   GLUCOSE 91 03/06/2020 1159   BUN 17 03/06/2020 1159   CREATININE 0.67 03/06/2020 1159   CALCIUM 9.7 03/06/2020 1159   GFRNONAA >60 03/06/2020 1159   GFRAA >60 06/09/2016 1333    BNP No results  found for: BNP  ProBNP No results found for: PROBNP  Imaging: DG Chest 2 View  Result Date: 07/22/2020 CLINICAL DATA:  Dyspnea on exertion, asthma EXAM: CHEST - 2 VIEW COMPARISON:  07/30/2017 FINDINGS: Stable hyperinflation and biapical pleural-parenchymal scarring. No acute airspace process, collapse or consolidation. Negative for edema, effusion or pneumothorax. Similar degenerative changes of the right first rib and manubrial junction projecting over the right  apex. Trachea midline.  Aorta atherosclerotic. IMPRESSION: Stable hyperinflation.  No interval change or acute process Electronically Signed   By: Jerilynn Mages.  Shick M.D.   On: 07/22/2020 14:06     Assessment & Plan:   Asthma - Well controlled on present treatment. No SABA use.  - 10/16/17 FENO 59 (unable to repeat today d/t office covid restrictions) - Continue Pulmicort 163mcg two puffs twice daily; prn albuterol hfa  - Refills provided - FU in 1 year or sooner if needed   Pulmonary nodules - Former smoker.  - CT chest in June 2020 showed small subpleural nodules bilaterally measuring 2-34mm and considered benign.   Centrilobular emphysema ([facility]) - Mild on imaging. PFTs showed no obstructive airway disease  - CXR 07/20/2020 showed stable hyperinflation and biapical pleural parenchymal scarring     Martyn Ehrich, NP 07/23/2020

## 2020-07-20 NOTE — Progress Notes (Deleted)
@Patient  ID: Dominique Cobb, female    DOB: [birth year], 75 y.o.   MRN: [id number]  No chief complaint on file.   Referring provider: Josetta Huddle, MD  HPI: 75 year old female, former smoker quit in 2006 (80 pack year hx). PMH significant for asthma, pulmonary nodules, emphysema. Patient of Dr. Chase Caller, last seen in office on 06/10/18.   07/20/2020 Patient presents today for overdue follow-up asthma. She is needing medication refills. Maintained on Pulmicort 173mcg 2 puffs twice daily, prn albuterol hfa and flonase nasal spray.       Pulmonary testing: 07/20/2020 PFTs- FVC 2.78 (106%), FEV1 2.14 (109%), ratio 77, DLCOunc 19.10 (88%), no BD response  Feno 09/04/2017 - 29 ppb (on prednsione) ->  Repeat FeNO 10/16/2017 ->  59   Imaging: CT chest June 2020- small subpleural nodules bilaterally measuring 2-46mm considered benign, no dedicated  follow-up necessary.   Allergies  Allergen Reactions  . Codeine Other (See Comments)    Sweating and Patient just doesn't like.     Immunization History  Administered Date(s) Administered  . Influenza, High Dose Seasonal PF 03/02/2017    Past Medical History:  Diagnosis Date  . Anxiety   . Aortic atherosclerosis ([place])   . Arthritis   . Asthma   . Depression   . Dysphagia   . Dyspnea    on exertion  . Family history of adverse reaction to anesthesia    father's lung collapsed during lumbar surgery  . GERD (gastroesophageal reflux disease)     Tobacco History: Social History   Tobacco Use  Smoking Status Former Smoker  . Packs/day: 2.00  . Years: 40.00  . Pack years: 80.00  . Types: Cigarettes  . Quit date: 09/04/2004  . Years since quitting: 15.8  Smokeless Tobacco Never Used   Counseling given: Not Answered   Outpatient Medications Prior to Visit  Medication Sig Dispense Refill  . albuterol (VENTOLIN HFA) 108 (90 Base) MCG/ACT inhaler Inhale 2 puffs into the lungs every 6 (six) hours as needed for wheezing or  shortness of breath. 1 Inhaler 6  . budesonide (PULMICORT FLEXHALER) 180 MCG/ACT inhaler Inhale 2 puffs into the lungs in the morning and at bedtime. 1 each 0  . buPROPion (WELLBUTRIN SR) 200 MG 12 hr tablet Take 200 mg by mouth 2 (two) times daily.     . cycloSPORINE (RESTASIS) 0.05 % ophthalmic emulsion Place 1 drop into both eyes 2 (two) times daily.  (Patient not taking: Reported on 04/24/2020)    . escitalopram (LEXAPRO) 10 MG tablet Take 5 mg by mouth 2 (two) times daily.     . fluticasone (FLONASE) 50 MCG/ACT nasal spray Place 1 spray into both nostrils daily. 16 g 3  . HYDROcodone-acetaminophen (NORCO) 10-325 MG tablet Take 1 tablet by mouth every 6 (six) hours as needed. (Patient not taking: Reported on 04/24/2020) 10 tablet 0  . ibuprofen (ADVIL) 400 MG tablet Take 400 mg by mouth daily as needed for moderate pain.     . Multiple Vitamin (MULTIVITAMIN WITH MINERALS) TABS tablet Take 1 tablet by mouth daily.    . Omega-3 Fatty Acids (FISH OIL) 1000 MG CAPS Take 1,000 mg by mouth daily.     . rosuvastatin (CRESTOR) 5 MG tablet Take 5 mg by mouth 3 (three) times a week.    . tamoxifen (NOLVADEX) 20 MG tablet Take 1 tablet (20 mg total) by mouth daily. 90 tablet 3  . Turmeric 500 MG CAPS Take 500 mg by  mouth daily.     Marland Kitchen zolpidem (AMBIEN) 5 MG tablet Take 5 mg by mouth at bedtime as needed for sleep.      No facility-administered medications prior to visit.      Review of Systems  Review of Systems   Physical Exam  There were no vitals taken for this visit. Physical Exam   Lab Results:  CBC    Component Value Date/Time   WBC 5.6 03/06/2020 1159   RBC 4.59 03/06/2020 1159   HGB 14.5 03/06/2020 1159   HCT 45.9 03/06/2020 1159   PLT 209 03/06/2020 1159   MCV 100.0 03/06/2020 1159   MCH 31.6 03/06/2020 1159   MCHC 31.6 03/06/2020 1159   RDW 12.1 03/06/2020 1159    BMET    Component Value Date/Time   NA 142 03/06/2020 1159   K 4.1 03/06/2020 1159   CL 103  03/06/2020 1159   CO2 29 03/06/2020 1159   GLUCOSE 91 03/06/2020 1159   BUN 17 03/06/2020 1159   CREATININE 0.67 03/06/2020 1159   CALCIUM 9.7 03/06/2020 1159   GFRNONAA >60 03/06/2020 1159   GFRAA >60 06/09/2016 1333    BNP No results found for: BNP  ProBNP No results found for: PROBNP  Imaging: No results found.   Assessment & Plan:   No problem-specific Assessment & Plan notes found for this encounter.     Martyn Ehrich, NP 07/20/2020

## 2020-07-23 ENCOUNTER — Telehealth: Payer: Self-pay | Admitting: Primary Care

## 2020-07-23 ENCOUNTER — Encounter: Payer: Self-pay | Admitting: Primary Care

## 2020-07-23 DIAGNOSIS — J432 Centrilobular emphysema: Secondary | ICD-10-CM | POA: Insufficient documentation

## 2020-07-23 DIAGNOSIS — R918 Other nonspecific abnormal finding of lung field: Secondary | ICD-10-CM | POA: Insufficient documentation

## 2020-07-23 DIAGNOSIS — J45909 Unspecified asthma, uncomplicated: Secondary | ICD-10-CM | POA: Insufficient documentation

## 2020-07-23 NOTE — Telephone Encounter (Signed)
Martyn Ehrich, NP  Shivaun Bilello, Waldemar Dickens, CMA Please let patient know CXR was stable. She has some hyperinflation and scarring. No acute process. No evidence of large lung nodules/mass.    Called and spoke with pt letting her know the results of cxr and she verbalized understanding. Nothing further needed.

## 2020-07-23 NOTE — Assessment & Plan Note (Addendum)
-   Well controlled on present treatment. No SABA use.  - 10/16/17 FENO 59 (unable to repeat today d/t office covid restrictions) - Continue Pulmicort 140mcg two puffs twice daily; prn albuterol hfa  - Refills provided - FU in 1 year or sooner if needed

## 2020-07-23 NOTE — Assessment & Plan Note (Signed)
-   Former smoker.  - CT chest in June 2020 showed small subpleural nodules bilaterally measuring 2-47mm and considered benign.

## 2020-07-23 NOTE — Progress Notes (Signed)
Please let patient know CXR was stable. She has some hyperinflation and scarring. No acute process. No evidence of large lung nodules/mass.

## 2020-07-23 NOTE — Assessment & Plan Note (Addendum)
-   Mild on imaging. PFTs showed no obstructive airway disease  - CXR 07/20/2020 showed stable hyperinflation and biapical pleural parenchymal scarring

## 2020-08-21 ENCOUNTER — Inpatient Hospital Stay (HOSPITAL_COMMUNITY): Payer: Medicare PPO | Attending: Hematology

## 2020-08-21 ENCOUNTER — Other Ambulatory Visit: Payer: Self-pay

## 2020-08-21 DIAGNOSIS — M810 Age-related osteoporosis without current pathological fracture: Secondary | ICD-10-CM | POA: Insufficient documentation

## 2020-08-21 DIAGNOSIS — Z79899 Other long term (current) drug therapy: Secondary | ICD-10-CM | POA: Insufficient documentation

## 2020-08-21 DIAGNOSIS — Z170 Estrogen receptor positive status [ER+]: Secondary | ICD-10-CM | POA: Diagnosis not present

## 2020-08-21 DIAGNOSIS — C50512 Malignant neoplasm of lower-outer quadrant of left female breast: Secondary | ICD-10-CM | POA: Insufficient documentation

## 2020-08-21 DIAGNOSIS — Z79810 Long term (current) use of selective estrogen receptor modulators (SERMs): Secondary | ICD-10-CM | POA: Insufficient documentation

## 2020-08-21 LAB — CBC WITH DIFFERENTIAL/PLATELET
Abs Immature Granulocytes: 0.03 10*3/uL (ref 0.00–0.07)
Basophils Absolute: 0 10*3/uL (ref 0.0–0.1)
Basophils Relative: 1 %
Eosinophils Absolute: 0.2 10*3/uL (ref 0.0–0.5)
Eosinophils Relative: 2 %
HCT: 40.8 % (ref 36.0–46.0)
Hemoglobin: 13.1 g/dL (ref 12.0–15.0)
Immature Granulocytes: 0 %
Lymphocytes Relative: 21 %
Lymphs Abs: 1.5 10*3/uL (ref 0.7–4.0)
MCH: 31.6 pg (ref 26.0–34.0)
MCHC: 32.1 g/dL (ref 30.0–36.0)
MCV: 98.3 fL (ref 80.0–100.0)
Monocytes Absolute: 0.6 10*3/uL (ref 0.1–1.0)
Monocytes Relative: 8 %
Neutro Abs: 4.9 10*3/uL (ref 1.7–7.7)
Neutrophils Relative %: 68 %
Platelets: 205 10*3/uL (ref 150–400)
RBC: 4.15 MIL/uL (ref 3.87–5.11)
RDW: 12.1 % (ref 11.5–15.5)
WBC: 7.2 10*3/uL (ref 4.0–10.5)
nRBC: 0 % (ref 0.0–0.2)

## 2020-08-21 LAB — COMPREHENSIVE METABOLIC PANEL
ALT: 21 U/L (ref 0–44)
AST: 21 U/L (ref 15–41)
Albumin: 3.8 g/dL (ref 3.5–5.0)
Alkaline Phosphatase: 43 U/L (ref 38–126)
Anion gap: 8 (ref 5–15)
BUN: 20 mg/dL (ref 8–23)
CO2: 28 mmol/L (ref 22–32)
Calcium: 8.9 mg/dL (ref 8.9–10.3)
Chloride: 102 mmol/L (ref 98–111)
Creatinine, Ser: 0.6 mg/dL (ref 0.44–1.00)
GFR, Estimated: >60 mL/min (ref >60)
Glucose, Bld: 96 mg/dL (ref 70–99)
Potassium: 3.8 mmol/L (ref 3.5–5.1)
Sodium: 138 mmol/L (ref 135–145)
Total Bilirubin: 0.8 mg/dL (ref 0.3–1.2)
Total Protein: 6.3 g/dL — ABNORMAL LOW (ref 6.5–8.1)

## 2020-08-21 LAB — VITAMIN D 25 HYDROXY (VIT D DEFICIENCY, FRACTURES): Vit D, 25-Hydroxy: 54.85 ng/mL (ref 30–100)

## 2020-08-22 ENCOUNTER — Other Ambulatory Visit: Payer: Self-pay | Admitting: Primary Care

## 2020-08-28 ENCOUNTER — Other Ambulatory Visit: Payer: Self-pay

## 2020-08-28 ENCOUNTER — Inpatient Hospital Stay (HOSPITAL_COMMUNITY): Payer: Medicare PPO | Admitting: Hematology

## 2020-08-28 VITALS — BP 130/72 | HR 73 | Temp 97.1°F | Resp 17 | Wt 135.1 lb

## 2020-08-28 DIAGNOSIS — C50512 Malignant neoplasm of lower-outer quadrant of left female breast: Secondary | ICD-10-CM | POA: Diagnosis not present

## 2020-08-28 DIAGNOSIS — M810 Age-related osteoporosis without current pathological fracture: Secondary | ICD-10-CM | POA: Diagnosis not present

## 2020-08-28 DIAGNOSIS — Z170 Estrogen receptor positive status [ER+]: Secondary | ICD-10-CM | POA: Diagnosis not present

## 2020-08-28 DIAGNOSIS — Z79810 Long term (current) use of selective estrogen receptor modulators (SERMs): Secondary | ICD-10-CM | POA: Diagnosis not present

## 2020-08-28 DIAGNOSIS — Z79899 Other long term (current) drug therapy: Secondary | ICD-10-CM | POA: Diagnosis not present

## 2020-08-28 NOTE — Patient Instructions (Signed)
[place] at [facility] Discharge Instructions  You were seen today by Dr. Delton Coombes. He went over your recent results. Continue taking vitamin D 1,000 units daily. Dr. Delton Coombes will see you back in 4 months for labs and follow up.   Thank you for choosing [place] at [facility] to provide your oncology and hematology care.  To afford each patient quality time with our provider, please arrive at least 15 minutes before your scheduled appointment time.   If you have a lab appointment with the [facility] please come in thru the Main Entrance and check in at the main information desk  You need to re-schedule your appointment should you arrive 10 or more minutes late.  We strive to give you quality time with our providers, and arriving late affects you and other patients whose appointments are after yours.  Also, if you no show three or more times for appointments you may be dismissed from the clinic at the providers discretion.     Again, thank you for choosing [facility].  Our hope is that these requests will decrease the amount of time that you wait before being seen by our physicians.       _____________________________________________________________  Should you have questions after your visit to [facility], please contact our office at [phone] between the hours of 8:00 a.m. and 4:30 p.m.  Voicemails left after 4:00 p.m. will not be returned until the following business day.  For prescription refill requests, have your pharmacy contact our office and allow 72 hours.    Cancer Center Support Programs:   > Cancer Support Group  2nd Tuesday of the month 1pm-2pm, Journey Room

## 2020-08-28 NOTE — Progress Notes (Signed)
[facility] [address] [place] [address]   Patient Care Team: Josetta Huddle, MD as PCP - General (Internal Medicine) Brien Mates, RN as Oncology Nurse Navigator (Oncology)  SUMMARY OF ONCOLOGIC HISTORY: Oncology History  Breast cancer of lower-outer quadrant of left female breast [facility])  04/12/2020 Genetic Testing   Oncotype DX:        CHIEF COMPLIANT: Follow-up for left breast cancer   INTERVAL HISTORY: [name] is a 75 y.o. female here today for follow up of her left breast cancer. Her last visit was on 04/24/2020.   Today she reports feeling okay. She denies having any new pains. She feels that her dose increase in Lexapro to 7.5 mg is not helping as much with her depression. She is tolerating the tamoxifen well and notes having mild occasional hot flashes and still has daily vaginal discharge which is not dark or bloody.  She will see her dentist again to determine if she needs more dental work.   REVIEW OF SYSTEMS:   Review of Systems  Constitutional: Positive for fatigue (50%). Negative for appetite change.  Endocrine: Positive for hot flashes (mild occasional).  Genitourinary: Positive for vaginal discharge (daily, non-bloody).   Musculoskeletal: Negative for arthralgias.  All other systems reviewed and are negative.   I have reviewed the past medical history, past surgical history, social history and family history with the patient and they are unchanged from previous note.   ALLERGIES:   is allergic to codeine.   MEDICATIONS:  Current Outpatient Medications  Medication Sig Dispense Refill  . escitalopram (LEXAPRO) 10 MG tablet Take 5 mg by mouth 2 (two) times daily.     . fluticasone (FLONASE) 50 MCG/ACT nasal spray Place 1 spray into both nostrils daily. 16 g 3  . ibuprofen (ADVIL) 400 MG tablet Take 400 mg by mouth daily as needed for moderate pain.     . Multiple Vitamin (MULTIVITAMIN WITH MINERALS) TABS tablet  Take 1 tablet by mouth daily.    . Omega-3 Fatty Acids (FISH OIL) 1000 MG CAPS Take 1,000 mg by mouth daily.    Marland Kitchen PULMICORT FLEXHALER 180 MCG/ACT inhaler INHALE TWO PUFFS INTO THE LUNGS EVERY MORNING & AT BEDTIME 1 each 5  . rosuvastatin (CRESTOR) 5 MG tablet Take 5 mg by mouth 3 (three) times a week.    . tamoxifen (NOLVADEX) 20 MG tablet Take 1 tablet (20 mg total) by mouth daily. 90 tablet 3  . Turmeric 500 MG CAPS Take 500 mg by mouth daily.     Marland Kitchen zolpidem (AMBIEN) 5 MG tablet Take 5 mg by mouth at bedtime as needed for sleep.     Marland Kitchen albuterol (VENTOLIN HFA) 108 (90 Base) MCG/ACT inhaler Inhale 2 puffs into the lungs every 6 (six) hours as needed for wheezing or shortness of breath. (Patient not taking: Reported on 08/28/2020) 1 Inhaler 6  . cycloSPORINE (RESTASIS) 0.05 % ophthalmic emulsion Place 1 drop into both eyes 2 (two) times daily.    Marland Kitchen HYDROcodone-acetaminophen (NORCO) 10-325 MG tablet Take 1 tablet by mouth every 6 (six) hours as needed. 10 tablet 0   No current facility-administered medications for this visit.     PHYSICAL EXAMINATION: Performance status (ECOG): 1 - Symptomatic but completely ambulatory  Vitals:   08/28/20 1502  BP: 130/72  Pulse: 73  Resp: 17  Temp: (!) 97.1 F (36.2 C)  SpO2: 95%   Wt Readings from Last 3 Encounters:  08/28/20  135 lb 1.6 oz (61.3 kg)  07/20/20 137 lb (62.1 kg)  04/24/20 139 lb 9.6 oz (63.3 kg)   Physical Exam Vitals reviewed.  Constitutional:      Appearance: Normal appearance.  Cardiovascular:     Rate and Rhythm: Normal rate and regular rhythm.     Pulses: Normal pulses.     Heart sounds: Normal heart sounds.  Pulmonary:     Effort: Pulmonary effort is normal.     Breath sounds: Normal breath sounds.  Chest:  Breasts:     Right: No swelling, bleeding, inverted nipple, mass, nipple discharge, skin change or tenderness.     Left: No swelling, bleeding, inverted nipple, mass, nipple discharge, skin change (lumpectomy scar  LOQ well healed) or tenderness.    Abdominal:     Palpations: Abdomen is soft. There is no hepatomegaly or mass.     Tenderness: There is no abdominal tenderness.     Hernia: No hernia is present.  Musculoskeletal:     Right lower leg: No edema.     Left lower leg: No edema.  Neurological:     General: No focal deficit present.     Mental Status: She is alert and oriented to person, place, and time.  Psychiatric:        Mood and Affect: Mood normal.        Behavior: Behavior normal.     Breast Exam Chaperone: Milinda Antis, MD     LABORATORY DATA:  I have reviewed the data as listed CMP Latest Ref Rng & Units 08/21/2020 03/06/2020 06/09/2016  Glucose 70 - 99 mg/dL 96 91 90  BUN 8 - 23 mg/dL '20 17 17  ' Creatinine 0.44 - 1.00 mg/dL 0.60 0.67 0.72  Sodium 135 - 145 mmol/L 138 142 140  Potassium 3.5 - 5.1 mmol/L 3.8 4.1 3.7  Chloride 98 - 111 mmol/L 102 103 107  CO2 22 - 32 mmol/L '28 29 25  ' Calcium 8.9 - 10.3 mg/dL 8.9 9.7 9.2  Total Protein 6.5 - 8.1 g/dL 6.3(L) - -  Total Bilirubin 0.3 - 1.2 mg/dL 0.8 - -  Alkaline Phos 38 - 126 U/L 43 - -  AST 15 - 41 U/L 21 - -  ALT 0 - 44 U/L 21 - -   No results found for: GMW102 Lab Results  Component Value Date   WBC 7.2 08/21/2020   HGB 13.1 08/21/2020   HCT 40.8 08/21/2020   MCV 98.3 08/21/2020   PLT 205 08/21/2020   NEUTROABS 4.9 08/21/2020   Lab Results  Component Value Date   VD25OH 54.85 08/21/2020    ASSESSMENT:  1. Stage I (T1CN0) left breast IDC: -Screening mammogram on 12/23/2019 indicated BI-RADS 0 with possible left breast mass. -Left breast ultrasound and mammogram with additional views on 01/10/2020 shows 1.6 x 1.1 x 1.2 cm mass in the left breast at 3 o'clock position. No left axillary adenopathy. -Biopsy on 01/20/2020 shows grade 2 IDC, ER 95%, PR 75% positive, Ki-67 10% and HER-2/neu 1+ by IHC. -She had lumpectomy x2 in the left breast more than 20 years ago which were benign. -Pathology on March 08, 2020  shows 1.5 cm invasive ductal carcinoma, grade 2, margins initially positive in the inferior margin, resection margins negative, 0/2 lymph nodes positive for invasive cancer. ER/PR positive, HER-2 1+, Ki-67 10%. -Oncotype DX recurrence score 23 -Tamoxifen started on 04/24/2020.  2. Social/family history: -She is retired Pharmacist, hospital. She quit smoking in 2006. Smoked for 40+ pack years. -  Paternal great aunt had breast cancer. No other malignancies.  3. Osteoporosis: -DEXA scan on November 06, 2014 with T score -3.1. -DEXA scan on 04/04/2020 with T score -3.1.   PLAN:  1. Stage I (T1CN0) left breast IDC, ER/PR positive, HER-2 negative: -She was started on tamoxifen secondary to osteoporosis. -She is tolerating tamoxifen without any major problems. -Wellbutrin was discontinued and she is on Lexapro 5 mg twice a day. -She reports that she does not like Lexapro. -I have offered switching to either Celexa or Effexor which are less likely to inhibit CYP2D 6.  She will let us know. -Reviewed her labs today which did not show any major abnormalities.  Vitamin D level was normal. -Physical examination did not reveal any palpable masses or lymphadenopathy. -RTC 4 months for follow-up with repeat labs and physical exam.   2. Bone health: -DEXA scan on 04/04/2020 showed T score -3.1, similar to DEXA scan from July 2016. -She is reluctant to consider bisphosphonates.  She would like to talk to her dentist and will follow up with me or Dr. Inda Merlin to initiate Fosamax.   Breast Cancer therapy associated bone loss: I have recommended calcium, Vitamin D and weight bearing exercises.   No orders of the defined types were placed in this encounter.  The patient has a good understanding of the overall plan. she agrees with it. she will call with any problems that may develop before the next visit here.    Derek Jack, MD [place] [phone]   I, Milinda Antis, am acting  as a scribe for Dr. Sanda Linger.  I, Derek Jack MD, have reviewed the above documentation for accuracy and completeness, and I agree with the above.

## 2020-08-31 ENCOUNTER — Telehealth: Payer: Self-pay | Admitting: Internal Medicine

## 2020-08-31 MED ORDER — DOXYCYCLINE HYCLATE 100 MG PO TABS: 100.0000 mg | ORAL_TABLET | Freq: Two times a day (BID) | ORAL | 0 refills | Status: DC

## 2020-08-31 MED ORDER — BUDESONIDE 180 MCG/ACT IN AEPB: 2.0000 | INHALATION_SPRAY | Freq: Two times a day (BID) | RESPIRATORY_TRACT | 5 refills | Status: DC

## 2020-08-31 MED ORDER — PREDNISONE 10 MG PO TABS: ORAL_TABLET | ORAL | 0 refills | Status: DC

## 2020-08-31 NOTE — Telephone Encounter (Signed)
Primary Pulmonologist: Ramaswamy Last office visit and with whom: 07/20/20  Geraldo Pitter What do we see them for (pulmonary problems): Asthma Last OV assessment/plan:  Assessment & Plan Note by Martyn Ehrich, NP at 07/23/2020 11:42 AM  Author: Martyn Ehrich, NP Author Type: Nurse Practitioner Filed: 07/23/2020 11:42 AM  Note Status: Written Cosign: Cosign Not Required Encounter Date: 07/20/2020  Problem: Pulmonary nodules  Editor: Martyn Ehrich, NP (Nurse Practitioner)             - Former smoker.  - CT chest in June 2020 showed small subpleural nodules bilaterally measuring 2-73mm and considered benign.        Patient Instructions by Martyn Ehrich, NP at 07/20/2020 3:30 PM  Author: Martyn Ehrich, NP Author Type: Nurse Practitioner Filed: 07/20/2020 4:07 PM  Note Status: Addendum Cosign: Cosign Not Required Encounter Date: 07/20/2020  Editor: Martyn Ehrich, NP (Nurse Practitioner)      Prior Versions: 1. Martyn Ehrich, NP (Nurse Practitioner) at 07/20/2020 4:03 PM - Addendum   2. Martyn Ehrich, NP (Nurse Practitioner) at 07/20/2020 4:02 PM - Signed  CT chest June 2020- small subpleural nodules bilaterally measuring 2-63mm considered benign, no dedicated follow-up necessary.   Pulmonary function testing in 2019 did not show overt obstructive lung disease FENO was elevated indicating an allergic asthmatic component to breathing   Orders: CXR today re: emphysema (ordered) PFTs in 1 year (ordered)  Follow-up: 1 year with Dr. Chase Caller and PFTs / or sooner if respiratory symptoms worsen           Instructions     Return in about 1 year (around 07/20/2021).  CT chest June 2020- small subpleural nodules bilaterally measuring 2-53mm considered benign, no dedicated follow-up necessary.   Pulmonary function testing in 2019 did not show overt obstructive lung disease FENO was elevated indicating an allergic asthmatic component to  breathing   Orders: CXR today re: emphysema (ordered) PFTs in 1 year (ordered)  Follow-up: 1 year with Dr. Chase Caller and PFTs / or sooner if respiratory symptoms worsen        Reason for call: s/s x 1 week.   Started with runny nose, sinus congestion. Has now moved down into chest congestion.  Now has a cough with thick yellowish mucous, cough started on yesterday.  Having more sob than normal.  Oxygen level at [facility] was 95% at a check up.  Feels a little better today.  Denies any fever, chills, body aches.  Partner had the same symptoms, had just sinus symptoms, did not go to chest.  Has not had to use her albuterol.  Using Pulmicort twice daily.  Feels it is still working.  Noticed some wheezing recently, but feels the Pulmicort has helped.  Received flu vaccine and all of her covid vaccines.  Patient is asking if the 4th covid vaccine is recommended for her.  She also states that she recently received a letter from her insurance company regarding her Pulmicort suggesting using the generic version of Pulmicort.  She states she is not being told they will not cover it, but it is costing her $64, she wants to use what is best.  Please advise.  (examples of things to ask: : When did symptoms start? Fever? Cough? Productive? Color to sputum? More sputum than usual? Wheezing? Have you needed increased oxygen? Are you taking your respiratory medications? What over the counter measures have you tried?)  Allergies  Allergen Reactions  . Codeine Other (  See Comments)    Sweating and Patient just doesn't like.     Immunization History  Administered Date(s) Administered  . Influenza Split 02/07/2020  . Influenza, High Dose Seasonal PF 03/02/2017, 03/04/2018, 02/01/2019  . Influenza-Unspecified 02/03/2019  . Moderna Sars-Covid-2 Vaccination 04/04/2020  . PFIZER(Purple Top)SARS-COV-2 Vaccination 07/20/2019, 08/10/2019  . Zoster Recombinat (Shingrix) 05/17/2018

## 2020-08-31 NOTE — Telephone Encounter (Signed)
Called patient twice, reviewed a busy tone each time. Will leave in triage to call back again in a few minutes.

## 2020-08-31 NOTE — Telephone Encounter (Signed)
Called and spoke with patient. She verbalized understanding of recommendations of the doxy and prednisone taper. Verified that she wishes to have these sent to [facility]. I explained to her MR's recommendations about Pulmicort. She stated that she had just picked up the Pulmicort from the pharmacy and it was $60 for the brand name. She wants to see if we can send in the generic to see how much it will cost. Advised her that I would take care of this for her.   Nothing further needed at time of call.

## 2020-08-31 NOTE — Telephone Encounter (Signed)
  Okay a lot of questions here.  Hopefully have captured all the questions and giving all the answers  1.  She does seem to have an asthma exacerbation  - Take doxycycline 100mg  po twice daily x 5 days; take after meals and avoid sunlight  - Please take prednisone 40 mg x1 day, then 30 mg x1 day, then 20 mg x1 day, then 10 mg x1 day, and then 5 mg x1 day and stop   2.  In terms of the question of generic Pulmicort.  I am pretty sure the generic will be just fine.  She should consider moving to the generic Pulmicort.  The other option is to do Arnuity inhaled steroid.  She will have to price it with insurance  3.  In terms of fourth shot COVID vaccine -the current CDC recommendation is to have a fourth dose but based on literature it is somewhat confusing how beneficial it would be because the vaccine is designed for the old strain and might not be effective of a new strain emerges particularly when the prevalence of Covid right now is low.  On balance that is of benefit and therefore it was advised by CDC.  From my perspective it would be an individual choice.  For sure third shot is helpful.  Our records indicate she is only had 2 shots.  Recommend she get the third shot.  If she has had a third shot then we need to know the date  Allergies  Allergen Reactions  . Codeine Other (See Comments)    Sweating and Patient just doesn't like.        Immunization History  Administered Date(s) Administered  . Influenza Split 02/07/2020  . Influenza, High Dose Seasonal PF 03/02/2017, 03/04/2018, 02/01/2019  . Influenza-Unspecified 02/03/2019  . Moderna Sars-Covid-2 Vaccination 04/04/2020  . PFIZER(Purple Top)SARS-COV-2 Vaccination 07/20/2019, 08/10/2019  . Zoster Recombinat (Shingrix) 05/17/2018

## 2020-09-10 ENCOUNTER — Other Ambulatory Visit: Payer: Self-pay

## 2020-09-10 ENCOUNTER — Ambulatory Visit: Payer: Medicare PPO | Attending: General Surgery

## 2020-09-10 DIAGNOSIS — Z483 Aftercare following surgery for neoplasm: Secondary | ICD-10-CM

## 2020-09-10 NOTE — Therapy (Signed)
[place], [place], [address] Phone: [phone]   Fax:  [phone]  Physical Therapy Treatment  Patient Details  Name: Dominique Cobb MRN: [id number] Date of Birth: Apr 28, 1946 Referring Provider (PT): Donne Hazel   Encounter Date: 09/10/2020   PT End of Session - 09/10/20 1627    Visit Number 9   # unchanged due to screen only   Number of Visits 15    Date for PT Re-Evaluation 06/04/20    PT Start Time 1621    PT Stop Time 1628    PT Time Calculation (min) 7 min    Activity Tolerance Patient tolerated treatment well    Behavior During Therapy [facility] for tasks assessed/performed           Past Medical History:  Diagnosis Date  . Anxiety   . Aortic atherosclerosis ([place])   . Arthritis   . Asthma   . Depression   . Dysphagia   . Dyspnea    on exertion  . Family history of adverse reaction to anesthesia    father's lung collapsed during lumbar surgery  . GERD (gastroesophageal reflux disease)     Past Surgical History:  Procedure Laterality Date  . BREAST CYST ASPIRATION Left   . BREAST EXCISIONAL BIOPSY Left   . BREAST LUMPECTOMY WITH RADIOACTIVE SEED AND SENTINEL LYMPH NODE BIOPSY Left 03/08/2020   Procedure: LEFT BREAST LUMPECTOMY WITH RADIOACTIVE SEED AND LEFT AXILLARY SENTINEL LYMPH NODE BIOPSY;  Surgeon: Rolm Bookbinder, MD;  Location: [facility];  Service: General;  Laterality: Left;  PEC BLOCK  . BREAST SURGERY     benign.  25 yrs ago  . CARPAL TUNNEL RELEASE  1980's   bilateral   . CATARACT EXTRACTION W/PHACO Left 06/12/2016   Procedure: CATARACT EXTRACTION PHACO AND INTRAOCULAR LENS PLACEMENT LEFT EYE JME2.68;  Surgeon: Tonny Branch, MD;  Location: [facility];  Service: Ophthalmology;  Laterality: Left;  left  . CATARACT EXTRACTION W/PHACO Right 07/24/2016   Procedure: CATARACT EXTRACTION PHACO AND INTRAOCULAR LENS PLACEMENT (IOC);  Surgeon: Tonny Branch, MD;  Location: [facility];  Service:  Ophthalmology;  Laterality: Right;  right cde 8.29  . COLONOSCOPY N/A 10/29/2012   Procedure: COLONOSCOPY;  Surgeon: Rogene Houston, MD;  Location: [facility];  Service: Endoscopy;  Laterality: N/A;  140  . Dental implants    . FINGER SURGERY     trigger finger 3rd  bilaterally  . laproscopy     over 20 yrs ago  . NECK SURGERY     2007  . TONSILLECTOMY      There were no vitals filed for this visit.   Subjective Assessment - 09/10/20 1624    Subjective Pt returns for her 3 month L-Dex screen.    Pertinent History L breast cancer, L breast lumpectomy and SLNB (2 both negative) 10/7 ER/PR+/HER2-, previous L breast lumpectomy with lymph node biopsy that turned out to not be cancer                  L-DEX FLOWSHEETS - 09/10/20 1600      L-DEX LYMPHEDEMA SCREENING   Measurement Type Unilateral    L-DEX MEASUREMENT EXTREMITY Upper Extremity    POSITION  Standing    DOMINANT SIDE Right    At Risk Side Left    BASELINE SCORE (UNILATERAL) 2.2    L-DEX SCORE (UNILATERAL) -2    VALUE CHANGE (UNILAT) -4.2  PT Long Term Goals - 05/07/20 1115      PT LONG TERM GOAL #1   Title Pt will return to baseline ROM measurements to allow pt to return to PLOF.    Time 8    Period Weeks    Status Achieved      PT LONG TERM GOAL #2   Title Pt will demonstrate no cording that is limiting ROM to allow her to return to prior level of function.    Baseline 12/6- pt is still demonstrating cording though the number of cords is much less but there is still a very thick cord from axilla to wrist    Time 4    Period Weeks    Status On-going      PT LONG TERM GOAL #3   Title Pt will be independent in self MLD for management of localized edema.    Time 4    Period Weeks    Status On-going      PT LONG TERM GOAL #4   Title Pt will obtain a compression sleeve and glove for long term managment of LUE edema.    Time 4    Period Weeks     Status New    Target Date 06/04/20                 Plan - 09/10/20 1628    Clinical Impression Statement Pt returns for her 3 month L-Dex screen. Her change from baseline of -4.2 is WNLs so no furhter treatment is required at this time except to cont eery 3 month L-Dex screens which pt is agreeable to.    PT Next Visit Plan Cont every 3 month L-Dex screens for up to 2 years from her SLNB.    Consulted and Agree with Plan of Care Patient           Patient will benefit from skilled therapeutic intervention in order to improve the following deficits and impairments:     Visit Diagnosis: Aftercare following surgery for neoplasm     Problem List Patient Active Problem List   Diagnosis Date Noted  . Asthma 07/23/2020  . Pulmonary nodules 07/23/2020  . Centrilobular emphysema ([place]) 07/23/2020  . Breast cancer of lower-outer quadrant of left female breast ([place]) 02/15/2020  . Change in stool 10/15/2012  . Dysphagia 03/20/2011  . GERD (gastroesophageal reflux disease) 03/20/2011  . Depression 03/20/2011    Otelia Limes, PTA 09/10/2020, 4:30 PM  [place], [place], [address] Phone: [phone]   Fax:  [phone]  Name: Dominique Cobb MRN: [id number] Date of Birth: [birth year]

## 2020-09-18 DIAGNOSIS — C50412 Malignant neoplasm of upper-outer quadrant of left female breast: Secondary | ICD-10-CM | POA: Diagnosis not present

## 2020-10-04 ENCOUNTER — Telehealth: Payer: Self-pay | Admitting: Internal Medicine

## 2020-10-04 NOTE — Telephone Encounter (Signed)
Lmtcb for pt.  

## 2020-10-08 NOTE — Telephone Encounter (Signed)
Lmtcb for pt.  

## 2020-10-12 NOTE — Telephone Encounter (Signed)
Called patient. She did not answer. Left message for her to call back. Will close encounter since this was the 3rd attempt.

## 2020-10-16 DIAGNOSIS — Z23 Encounter for immunization: Secondary | ICD-10-CM | POA: Diagnosis not present

## 2020-10-17 ENCOUNTER — Telehealth: Payer: Self-pay | Admitting: Internal Medicine

## 2020-10-17 NOTE — Telephone Encounter (Signed)
Erica from [facility] called and stated that the patient's Pulmicort inhaler in costing her $64 a month. Patient would like a cheaper inhaler. Danae Chen states she ran a few test claims and the Flovent HFA 110 and 220 are covered and would cost her $40 a month.   RM please advise.

## 2020-10-18 NOTE — Telephone Encounter (Signed)
Ok to do flovent 219mcmg 2 puff once daily - this wil make It stretch 2 months instad of 1 month and save money

## 2020-10-18 NOTE — Telephone Encounter (Signed)
Lm for patient.  

## 2020-10-23 NOTE — Telephone Encounter (Signed)
Lmtcb for pt.  

## 2020-10-24 ENCOUNTER — Encounter: Payer: Self-pay | Admitting: Emergency Medicine

## 2020-10-24 NOTE — Telephone Encounter (Signed)
Letter printed and placed in out-going mail.

## 2020-10-24 NOTE — Telephone Encounter (Signed)
lmtcb for pt. Will send letter regarding a medication change, Flovent hfa. This has not been sent to pharmacy as pt has not yet been notified of the change.   Will forward to triage to mail letter to pt (letter created).

## 2020-11-02 ENCOUNTER — Telehealth: Payer: Self-pay | Admitting: Internal Medicine

## 2020-11-02 MED ORDER — FLOVENT HFA 220 MCG/ACT IN AERO: 2.0000 | INHALATION_SPRAY | Freq: Every day | RESPIRATORY_TRACT | 11 refills | Status: DC

## 2020-11-02 NOTE — Telephone Encounter (Signed)
Spoke with the pt Advised per last phone note need to change the pulmicort to flovent- see PN dated 10/17/20  Pt ok with medication change and rx was sent to pharm  Nothing further needed

## 2020-11-24 IMAGING — US US BREAST BX W LOC DEV 1ST LESION IMG BX SPEC US GUIDE*L*
1 series · 12 of 12 positions shown · non-contrast
Comparison: Previous exam(s).
COMPARISON: Previous exam(s).

Addendum:
CLINICAL DATA: Left breast mass and distortion.

EXAM:
ULTRASOUND GUIDED LEFT BREAST CORE NEEDLE BIOPSY

[Series 1: us breast bx w loc dev 1st lesion img bx spec us g · 0.05mm/px · 12 of 12 slices shown]
[im 1/12]
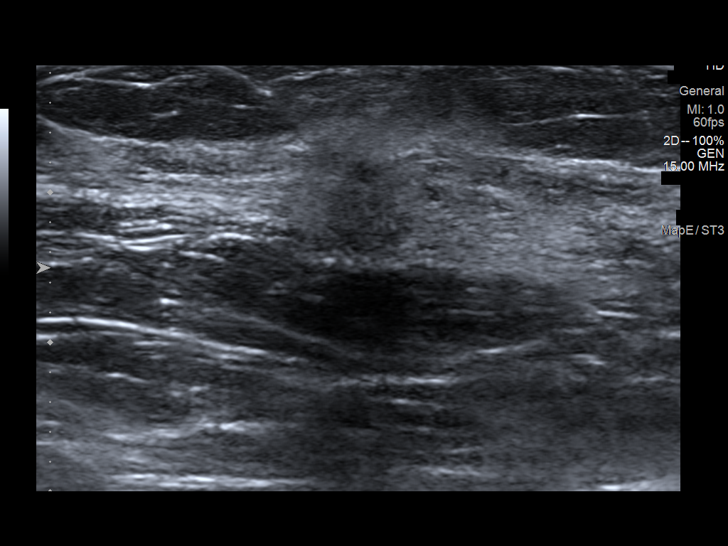
[im 2/12]
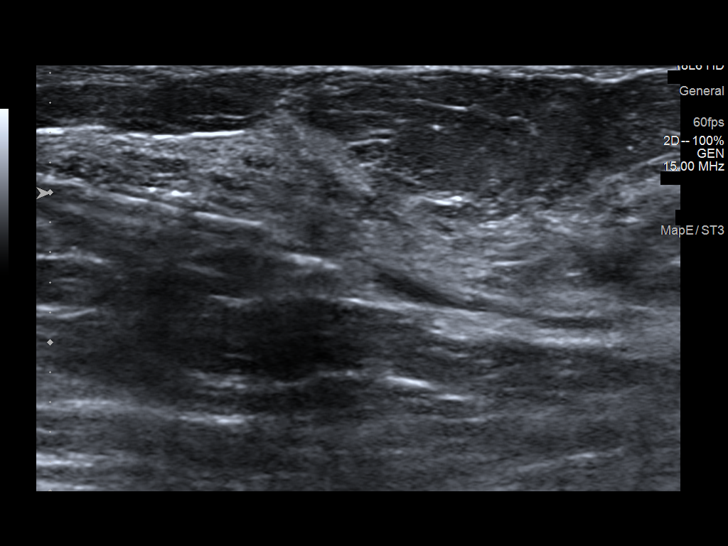
[im 3/12]
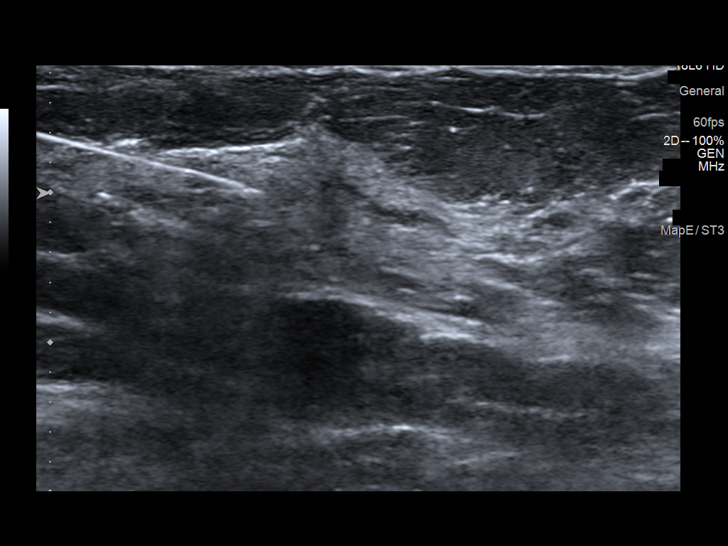
[im 4/12]
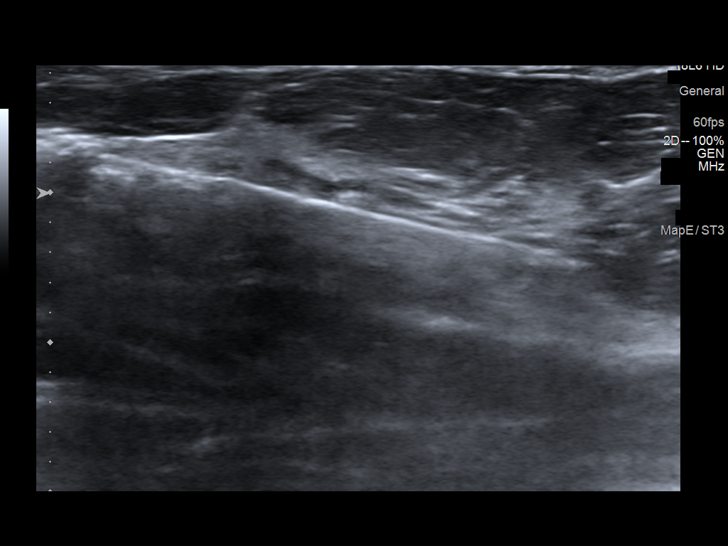
[im 5/12]
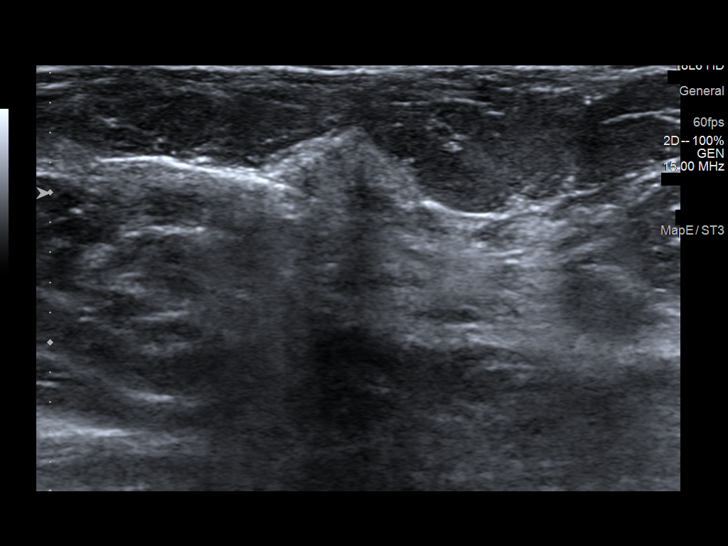
[im 6/12]
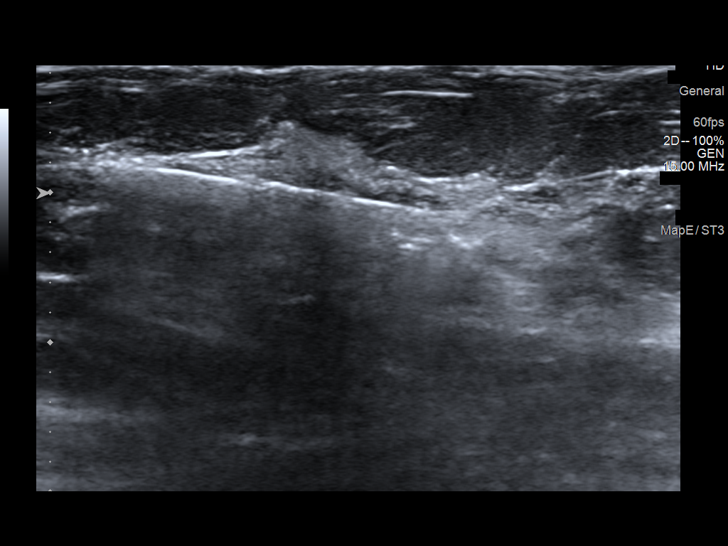
[im 7/12]
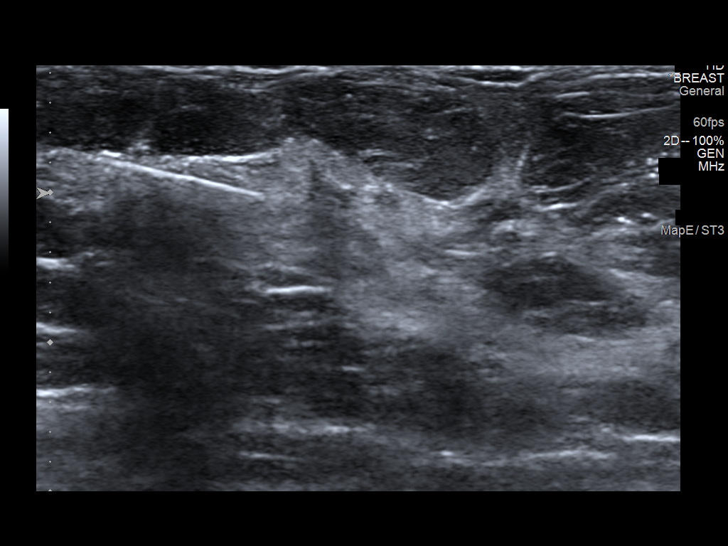
[im 8/12]
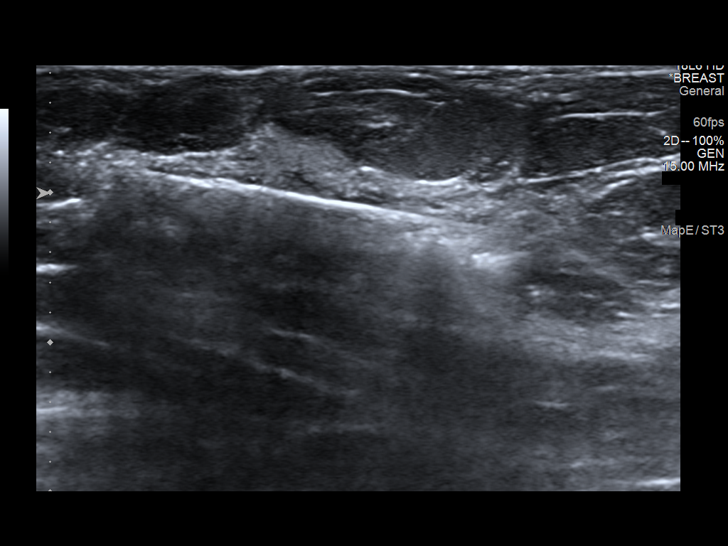
[im 9/12]
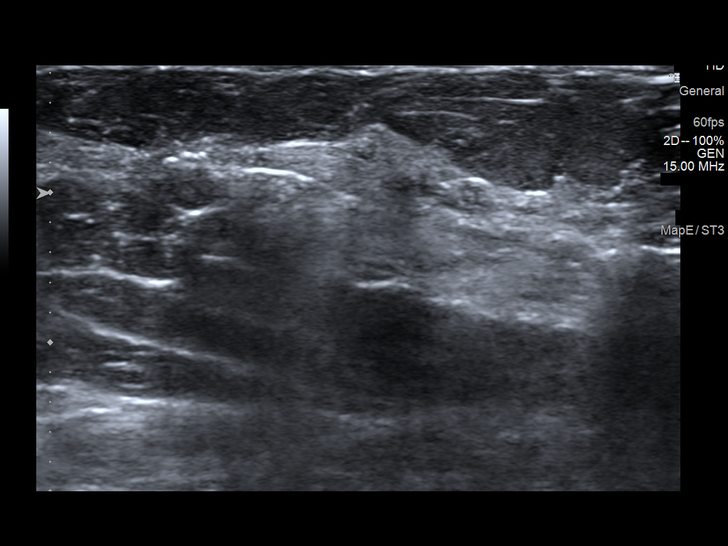
[im 10/12]
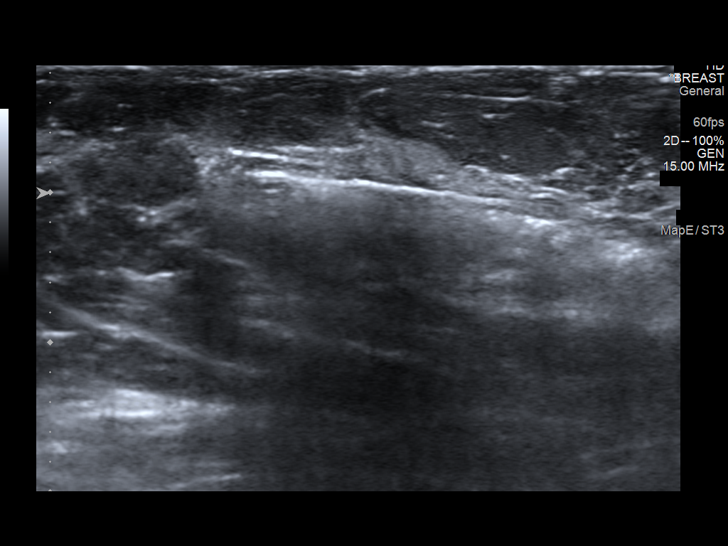
[im 11/12]
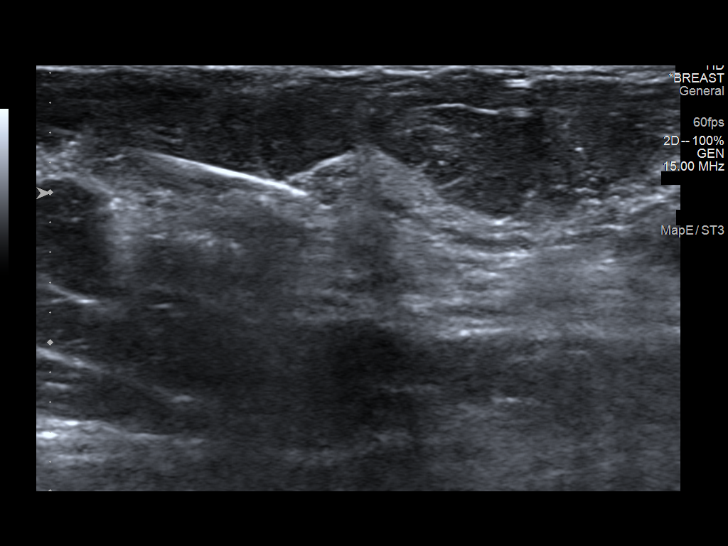
[im 12/12]
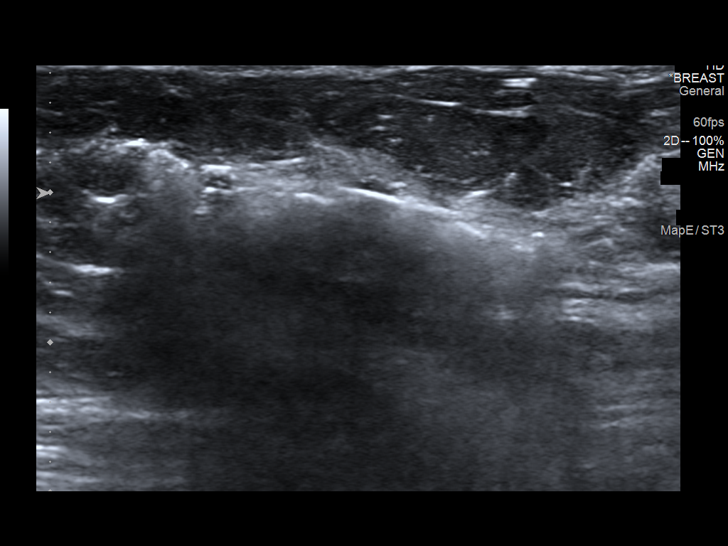

[12 of 12 positions shown; findings below may reference images not displayed]



Using sterile technique and 1% lidocaine and 1% lidocaine with
epinephrine as local anesthetic, under direct ultrasound
visualization, a 14 gauge Tiger device was used to perform
biopsy of a mass in the 3 o'clock region of the left breast using an
inferior to superior approach. Prior to the biopsy of ribbon shaped
clip was placed in the distortion. Follow up 2 view mammogram was
performed and dictated separately.
IMPRESSION: Ultrasound guided biopsy of the left breast. No apparent
complications.

ADDENDUM:
Pathology revealed GRADE II INVASIVE DUCTAL CARCINOMA. DUCTAL
CARCINOMA IN SITU of the LEFT breast, 3 o'clock. This was found to
be concordant by Dr. Rtoyota Joshjax.

Pathology results were discussed with the patient by telephone. The
patient reported doing well after the biopsy with tenderness at the
site. Post biopsy instructions and care were reviewed and questions
were answered. The patient was encouraged to call The [REDACTED]

Surgical consultation has been arranged with Dr. Abbess Abouda
at [REDACTED] on January 30, 2020.

Pathology results reported by Appiatus Dreamchaser RN on 01/24/2020.



Using sterile technique and 1% lidocaine and 1% lidocaine with
epinephrine as local anesthetic, under direct ultrasound
visualization, a 14 gauge Tiger device was used to perform
biopsy of a mass in the 3 o'clock region of the left breast using an
inferior to superior approach. Prior to the biopsy of ribbon shaped
clip was placed in the distortion. Follow up 2 view mammogram was
performed and dictated separately.
IMPRESSION: Ultrasound guided biopsy of the left breast. No apparent
complications.

## 2020-12-10 ENCOUNTER — Other Ambulatory Visit: Payer: Self-pay

## 2020-12-10 ENCOUNTER — Ambulatory Visit: Payer: Medicare PPO | Attending: General Surgery

## 2020-12-10 DIAGNOSIS — Z483 Aftercare following surgery for neoplasm: Secondary | ICD-10-CM | POA: Insufficient documentation

## 2020-12-10 NOTE — Therapy (Signed)
[place], [place], [address] Phone: [phone]   Fax:  [phone]  Physical Therapy Treatment  Patient Details  Name: Dominique Cobb MRN: [id number] Date of Birth: 09-24-1945 Referring Provider (PT): Donne Hazel   Encounter Date: 12/10/2020   PT End of Session - 12/10/20 1638     Visit Number 9   # unchanged due to screen only   PT Start Time 6962    PT Stop Time 1638    PT Time Calculation (min) 7 min    Activity Tolerance Patient tolerated treatment well    Behavior During Therapy [facility] for tasks assessed/performed             Past Medical History:  Diagnosis Date   Anxiety    Aortic atherosclerosis ([place])    Arthritis    Asthma    Depression    Dysphagia    Dyspnea    on exertion   Family history of adverse reaction to anesthesia    father's lung collapsed during lumbar surgery   GERD (gastroesophageal reflux disease)     Past Surgical History:  Procedure Laterality Date   BREAST CYST ASPIRATION Left    BREAST EXCISIONAL BIOPSY Left    BREAST LUMPECTOMY WITH RADIOACTIVE SEED AND SENTINEL LYMPH NODE BIOPSY Left 03/08/2020   Procedure: LEFT BREAST LUMPECTOMY WITH RADIOACTIVE SEED AND LEFT AXILLARY SENTINEL LYMPH NODE BIOPSY;  Surgeon: Rolm Bookbinder, MD;  Location: [facility];  Service: General;  Laterality: Left;  PEC BLOCK   BREAST SURGERY     benign.  25 yrs ago   [place]  1980's   bilateral    CATARACT EXTRACTION W/PHACO Left 06/12/2016   Procedure: CATARACT EXTRACTION PHACO AND INTRAOCULAR LENS PLACEMENT LEFT EYE XBM8.41;  Surgeon: Tonny Branch, MD;  [facility];  Service: Ophthalmology;  Laterality: Left;  left   CATARACT EXTRACTION W/PHACO Right 07/24/2016   Procedure: CATARACT EXTRACTION PHACO AND INTRAOCULAR LENS PLACEMENT (IOC);  Surgeon: Tonny Branch, MD;  [facility];  Service: Ophthalmology;  Laterality: Right;  right cde 8.29   COLONOSCOPY N/A  10/29/2012   Procedure: COLONOSCOPY;  Surgeon: Rogene Houston, MD;  [facility];  Service: Endoscopy;  Laterality: N/A;  140   Dental implants     FINGER SURGERY     trigger finger 3rd  bilaterally   laproscopy     over 20 yrs ago   NECK SURGERY     2007   TONSILLECTOMY      There were no vitals filed for this visit.   Subjective Assessment - 12/10/20 1632     Subjective Pt returns for her 3 month L-Dex screen.    Pertinent History L breast cancer, L breast lumpectomy and SLNB (2 both negative) 10/7 ER/PR+/HER2-, previous L breast lumpectomy with lymph node biopsy that turned out to not be cancer                    L-DEX FLOWSHEETS - 12/10/20 1600       L-DEX LYMPHEDEMA SCREENING   Measurement Type Unilateral    L-DEX MEASUREMENT EXTREMITY Upper Extremity    POSITION  Standing    DOMINANT SIDE Right    At Risk Side Left    BASELINE SCORE (UNILATERAL) 2.2    L-DEX SCORE (UNILATERAL) -2.3    VALUE CHANGE (UNILAT) -4.5  PT Long Term Goals - 05/07/20 1115       PT LONG TERM GOAL #1   Title Pt will return to baseline ROM measurements to allow pt to return to PLOF.    Time 8    Period Weeks    Status Achieved      PT LONG TERM GOAL #2   Title Pt will demonstrate no cording that is limiting ROM to allow her to return to prior level of function.    Baseline 12/6- pt is still demonstrating cording though the number of cords is much less but there is still a very thick cord from axilla to wrist    Time 4    Period Weeks    Status On-going      PT LONG TERM GOAL #3   Title Pt will be independent in self MLD for management of localized edema.    Time 4    Period Weeks    Status On-going      PT LONG TERM GOAL #4   Title Pt will obtain a compression sleeve and glove for long term managment of LUE edema.    Time 4    Period Weeks    Status New    Target Date 06/04/20                    Plan - 12/10/20 1639     Clinical Impression Statement Pt returns for her 3 month L-Dex screen. Her change from baseline of -4.5 is WNLs so no further treatment is required at this time except to cont every 3 month L-Dex screens which pt is agreeable to.    PT Next Visit Plan Cont every 3 month L-Dex screens for up to 2 years from her SLNB.    Consulted and Agree with Plan of Care Patient             Patient will benefit from skilled therapeutic intervention in order to improve the following deficits and impairments:     Visit Diagnosis: Aftercare following surgery for neoplasm     Problem List Patient Active Problem List   Diagnosis Date Noted   Asthma 07/23/2020   Pulmonary nodules 07/23/2020   Centrilobular emphysema ([place]) 07/23/2020   Breast cancer of lower-outer quadrant of left female breast ([place]) 02/15/2020   Change in stool 10/15/2012   Dysphagia 03/20/2011   GERD (gastroesophageal reflux disease) 03/20/2011   Depression 03/20/2011    Otelia Limes, PTA 12/10/2020, 4:41 PM  [facility] [place], [place], [address] Phone: [phone]   Fax:  [phone]  Name: Dominique Cobb MRN: [id number] Date of Birth: 1945/12/07

## 2020-12-20 DIAGNOSIS — C50412 Malignant neoplasm of upper-outer quadrant of left female breast: Secondary | ICD-10-CM | POA: Diagnosis not present

## 2020-12-25 DIAGNOSIS — C50412 Malignant neoplasm of upper-outer quadrant of left female breast: Secondary | ICD-10-CM | POA: Diagnosis not present

## 2020-12-27 ENCOUNTER — Inpatient Hospital Stay (HOSPITAL_COMMUNITY): Payer: Medicare PPO | Attending: Hematology

## 2021-01-03 ENCOUNTER — Ambulatory Visit (HOSPITAL_COMMUNITY): Payer: Medicare PPO | Admitting: Hematology

## 2021-02-11 ENCOUNTER — Telehealth: Payer: Self-pay | Admitting: Internal Medicine

## 2021-02-11 NOTE — Telephone Encounter (Signed)
Called patient but she did not answer. Left message for her to call back.  

## 2021-02-14 MED ORDER — FLUTICASONE PROPIONATE HFA 220 MCG/ACT IN AERO: 2.0000 | INHALATION_SPRAY | Freq: Every day | RESPIRATORY_TRACT | 5 refills | Status: DC

## 2021-02-14 NOTE — Telephone Encounter (Signed)
Called and spoke with patient. She is aware that I have sent in her refills. I have sent enough refills to last until it is time for her follow up. She verbalized understanding.   Nothing further needed at time of call.

## 2021-02-14 NOTE — Telephone Encounter (Signed)
Pt calling regard Flovent refill. [phone]  Pharm: [facility]

## 2021-02-19 DIAGNOSIS — C50912 Malignant neoplasm of unspecified site of left female breast: Secondary | ICD-10-CM | POA: Diagnosis not present

## 2021-02-19 DIAGNOSIS — Z0000 Encounter for general adult medical examination without abnormal findings: Secondary | ICD-10-CM | POA: Diagnosis not present

## 2021-02-19 DIAGNOSIS — Z79899 Other long term (current) drug therapy: Secondary | ICD-10-CM | POA: Diagnosis not present

## 2021-02-19 DIAGNOSIS — R918 Other nonspecific abnormal finding of lung field: Secondary | ICD-10-CM | POA: Diagnosis not present

## 2021-02-19 DIAGNOSIS — F339 Major depressive disorder, recurrent, unspecified: Secondary | ICD-10-CM | POA: Diagnosis not present

## 2021-02-19 DIAGNOSIS — E559 Vitamin D deficiency, unspecified: Secondary | ICD-10-CM | POA: Diagnosis not present

## 2021-02-19 DIAGNOSIS — Z8709 Personal history of other diseases of the respiratory system: Secondary | ICD-10-CM | POA: Diagnosis not present

## 2021-02-19 DIAGNOSIS — K5900 Constipation, unspecified: Secondary | ICD-10-CM | POA: Diagnosis not present

## 2021-02-19 DIAGNOSIS — R41840 Attention and concentration deficit: Secondary | ICD-10-CM | POA: Diagnosis not present

## 2021-02-19 DIAGNOSIS — Z23 Encounter for immunization: Secondary | ICD-10-CM | POA: Diagnosis not present

## 2021-02-19 DIAGNOSIS — F322 Major depressive disorder, single episode, severe without psychotic features: Secondary | ICD-10-CM | POA: Diagnosis not present

## 2021-02-19 DIAGNOSIS — I2510 Atherosclerotic heart disease of native coronary artery without angina pectoris: Secondary | ICD-10-CM | POA: Diagnosis not present

## 2021-03-11 ENCOUNTER — Ambulatory Visit: Payer: Medicare PPO | Attending: General Surgery

## 2021-03-11 ENCOUNTER — Other Ambulatory Visit: Payer: Self-pay

## 2021-03-11 VITALS — Wt 130.1 lb

## 2021-03-11 DIAGNOSIS — Z483 Aftercare following surgery for neoplasm: Secondary | ICD-10-CM | POA: Insufficient documentation

## 2021-03-11 NOTE — Therapy (Signed)
[facility] @ [place], [place], [address] Phone: [phone]   Fax:  [phone]  Physical Therapy Treatment  Patient Details  Name: Dominique Cobb MRN: [id number] Date of Birth: 1946-03-17 No data recorded  Encounter Date: 03/11/2021   PT End of Session - 03/11/21 1605     Visit Number 9   # unchanged due to screen only   PT Start Time 1604    PT Stop Time 1609    PT Time Calculation (min) 5 min    Activity Tolerance Patient tolerated treatment well    Behavior During Therapy WFL for tasks assessed/performed             Past Medical History:  Diagnosis Date   Anxiety    Aortic atherosclerosis ([place])    Arthritis    Asthma    Depression    Dysphagia    Dyspnea    on exertion   Family history of adverse reaction to anesthesia    father's lung collapsed during lumbar surgery   GERD (gastroesophageal reflux disease)     Past Surgical History:  Procedure Laterality Date   BREAST CYST ASPIRATION Left    BREAST EXCISIONAL BIOPSY Left    BREAST LUMPECTOMY WITH RADIOACTIVE SEED AND SENTINEL LYMPH NODE BIOPSY Left 03/08/2020   Procedure: LEFT BREAST LUMPECTOMY WITH RADIOACTIVE SEED AND LEFT AXILLARY SENTINEL LYMPH NODE BIOPSY;  Surgeon: Rolm Bookbinder, MD;  Location: [facility];  Service: General;  Laterality: Left;  PEC BLOCK   BREAST SURGERY     benign.  25 yrs ago   [place]  1980's   bilateral    CATARACT EXTRACTION W/PHACO Left 06/12/2016   Procedure: CATARACT EXTRACTION PHACO AND INTRAOCULAR LENS PLACEMENT LEFT EYE RDE0.81;  Surgeon: Tonny Branch, MD;  [facility];  Service: Ophthalmology;  Laterality: Left;  left   CATARACT EXTRACTION W/PHACO Right 07/24/2016   Procedure: CATARACT EXTRACTION PHACO AND INTRAOCULAR LENS PLACEMENT (IOC);  Surgeon: Tonny Branch, MD;  [facility];  Service: Ophthalmology;  Laterality: Right;  right cde 8.29   COLONOSCOPY N/A 10/29/2012    Procedure: COLONOSCOPY;  Surgeon: Rogene Houston, MD;  [facility];  Service: Endoscopy;  Laterality: N/A;  140   Dental implants     FINGER SURGERY     trigger finger 3rd  bilaterally   laproscopy     over 20 yrs ago   NECK SURGERY     2007   TONSILLECTOMY      Vitals:   03/11/21 1605  Weight: 130 lb 2 oz (59 kg)     Subjective Assessment - 03/11/21 1605     Subjective Pt returns for her 3 month L-Dex screen.    Pertinent History L breast cancer, L breast lumpectomy and SLNB (2 both negative) 10/7 ER/PR+/HER2-, previous L breast lumpectomy with lymph node biopsy that turned out to not be cancer                    L-DEX FLOWSHEETS - 03/11/21 1600       L-DEX LYMPHEDEMA SCREENING   Measurement Type Unilateral    L-DEX MEASUREMENT EXTREMITY Upper Extremity    POSITION  Standing    DOMINANT SIDE Right    At Risk Side Left    BASELINE SCORE (UNILATERAL) 2.2    L-DEX SCORE (UNILATERAL) -1.3    VALUE CHANGE (UNILAT) -3.5  PT Long Term Goals - 05/07/20 1115       PT LONG TERM GOAL #1   Title Pt will return to baseline ROM measurements to allow pt to return to PLOF.    Time 8    Period Weeks    Status Achieved      PT LONG TERM GOAL #2   Title Pt will demonstrate no cording that is limiting ROM to allow her to return to prior level of function.    Baseline 12/6- pt is still demonstrating cording though the number of cords is much less but there is still a very thick cord from axilla to wrist    Time 4    Period Weeks    Status On-going      PT LONG TERM GOAL #3   Title Pt will be independent in self MLD for management of localized edema.    Time 4    Period Weeks    Status On-going      PT LONG TERM GOAL #4   Title Pt will obtain a compression sleeve and glove for long term managment of LUE edema.    Time 4    Period Weeks    Status New    Target Date 06/04/20                    Plan - 03/11/21 1609     Clinical Impression Statement Pt returns for her 3 month L-Dex screen. Her change from baseline of -3.5 is WNLs so no further treatment is required at this time except to cont every 3 month L-Dex screen which pt is agreeable to.    PT Next Visit Plan Cont every 3 month L-Dex screens for up to 2 years from her SLNB    Consulted and Agree with Plan of Care Patient             Patient will benefit from skilled therapeutic intervention in order to improve the following deficits and impairments:     Visit Diagnosis: Aftercare following surgery for neoplasm     Problem List Patient Active Problem List   Diagnosis Date Noted   Asthma 07/23/2020   Pulmonary nodules 07/23/2020   Centrilobular emphysema ([place]) 07/23/2020   Breast cancer of lower-outer quadrant of left female breast ([place]) 02/15/2020   Change in stool 10/15/2012   Dysphagia 03/20/2011   GERD (gastroesophageal reflux disease) 03/20/2011   Depression 03/20/2011    Otelia Limes, PTA 03/11/2021, 4:13 PM  [facility] @ [place], [place], [address] Phone: [phone]   Fax:  [phone]  Name: Dominique Cobb MRN: [id number] Date of Birth: 07/14/1945

## 2021-04-12 ENCOUNTER — Other Ambulatory Visit (HOSPITAL_COMMUNITY): Payer: Self-pay | Admitting: Hematology

## 2021-06-24 ENCOUNTER — Ambulatory Visit: Payer: Medicare PPO | Attending: General Surgery

## 2021-06-24 ENCOUNTER — Other Ambulatory Visit: Payer: Self-pay

## 2021-06-24 VITALS — Wt 132.4 lb

## 2021-06-24 DIAGNOSIS — Z483 Aftercare following surgery for neoplasm: Secondary | ICD-10-CM | POA: Insufficient documentation

## 2021-06-24 NOTE — Therapy (Signed)
[facility] @ [place], [place], [address] Phone: [phone]   Fax:  [phone]  Physical Therapy Treatment  Patient Details  Name: Dominique Cobb MRN: [id number] Date of Birth: 1945-09-17 No data recorded  Encounter Date: 06/24/2021   PT End of Session - 06/24/21 1444     Visit Number 9   # unchanged due to screen only   PT Start Time 4158    PT Stop Time 1446    PT Time Calculation (min) 4 min    Activity Tolerance Patient tolerated treatment well    Behavior During Therapy WFL for tasks assessed/performed             Past Medical History:  Diagnosis Date   Anxiety    Aortic atherosclerosis ([place])    Arthritis    Asthma    Depression    Dysphagia    Dyspnea    on exertion   Family history of adverse reaction to anesthesia    father's lung collapsed during lumbar surgery   GERD (gastroesophageal reflux disease)     Past Surgical History:  Procedure Laterality Date   BREAST CYST ASPIRATION Left    BREAST EXCISIONAL BIOPSY Left    BREAST LUMPECTOMY WITH RADIOACTIVE SEED AND SENTINEL LYMPH NODE BIOPSY Left 03/08/2020   Procedure: LEFT BREAST LUMPECTOMY WITH RADIOACTIVE SEED AND LEFT AXILLARY SENTINEL LYMPH NODE BIOPSY;  Surgeon: Rolm Bookbinder, MD;  Location: [facility];  Service: General;  Laterality: Left;  PEC BLOCK   BREAST SURGERY     benign.  25 yrs ago   [place]  1980's   bilateral    CATARACT EXTRACTION W/PHACO Left 06/12/2016   Procedure: CATARACT EXTRACTION PHACO AND INTRAOCULAR LENS PLACEMENT LEFT EYE XEN4.07;  Surgeon: Tonny Branch, MD;  [facility];  Service: Ophthalmology;  Laterality: Left;  left   CATARACT EXTRACTION W/PHACO Right 07/24/2016   Procedure: CATARACT EXTRACTION PHACO AND INTRAOCULAR LENS PLACEMENT (IOC);  Surgeon: Tonny Branch, MD;  [facility];  Service: Ophthalmology;  Laterality: Right;  right cde 8.29   COLONOSCOPY N/A 10/29/2012   Procedure:  COLONOSCOPY;  Surgeon: Rogene Houston, MD;  [facility];  Service: Endoscopy;  Laterality: N/A;  140   Dental implants     FINGER SURGERY     trigger finger 3rd  bilaterally   laproscopy     over 20 yrs ago   NECK SURGERY     2007   TONSILLECTOMY      Vitals:   06/24/21 1443  Weight: 132 lb 6 oz (60 kg)     Subjective Assessment - 06/24/21 1443     Subjective Pt returns for her 3 month L-Dex screen.    Pertinent History L breast cancer, L breast lumpectomy and SLNB (2 both negative) 10/7 ER/PR+/HER2-, previous L breast lumpectomy with lymph node biopsy that turned out to not be cancer                    L-DEX FLOWSHEETS - 06/24/21 1400       L-DEX LYMPHEDEMA SCREENING   Measurement Type Unilateral    L-DEX MEASUREMENT EXTREMITY Upper Extremity    POSITION  Standing    DOMINANT SIDE Right    At Risk Side Left    BASELINE SCORE (UNILATERAL) 2.2    L-DEX SCORE (UNILATERAL) -3.6    VALUE CHANGE (UNILAT) -5.8  PT Long Term Goals - 05/07/20 1115       PT LONG TERM GOAL #1   Title Pt will return to baseline ROM measurements to allow pt to return to PLOF.    Time 8    Period Weeks    Status Achieved      PT LONG TERM GOAL #2   Title Pt will demonstrate no cording that is limiting ROM to allow her to return to prior level of function.    Baseline 12/6- pt is still demonstrating cording though the number of cords is much less but there is still a very thick cord from axilla to wrist    Time 4    Period Weeks    Status On-going      PT LONG TERM GOAL #3   Title Pt will be independent in self MLD for management of localized edema.    Time 4    Period Weeks    Status On-going      PT LONG TERM GOAL #4   Title Pt will obtain a compression sleeve and glove for long term managment of LUE edema.    Time 4    Period Weeks    Status New    Target Date 06/04/20                    Plan - 06/24/21 1445     Clinical Impression Statement Pt returns for her 3 month L-Dex screen. Her change from baseline of -5.8 is WNLs so no further treatment is required at this time except to cont every 3 month L-Dex screens which pt is agreeable to.    PT Next Visit Plan Cont every 3 month L-Dex screens for up to 2 years from her SLNB (~03/08/2022)    Consulted and Agree with Plan of Care Patient             Patient will benefit from skilled therapeutic intervention in order to improve the following deficits and impairments:     Visit Diagnosis: Aftercare following surgery for neoplasm     Problem List Patient Active Problem List   Diagnosis Date Noted   Asthma 07/23/2020   Pulmonary nodules 07/23/2020   Centrilobular emphysema ([place]) 07/23/2020   Breast cancer of lower-outer quadrant of left female breast ([place]) 02/15/2020   Change in stool 10/15/2012   Dysphagia 03/20/2011   GERD (gastroesophageal reflux disease) 03/20/2011   Depression 03/20/2011    Otelia Limes, PTA 06/24/2021, 2:47 PM  [facility] @ [place], [place], [address] Phone: [phone]   Fax:  [phone]  Name: Dominique Cobb MRN: [id number] Date of Birth: 1946-05-15

## 2021-07-01 DIAGNOSIS — C50912 Malignant neoplasm of unspecified site of left female breast: Secondary | ICD-10-CM | POA: Diagnosis not present

## 2021-07-01 DIAGNOSIS — R140 Abdominal distension (gaseous): Secondary | ICD-10-CM | POA: Diagnosis not present

## 2021-07-01 DIAGNOSIS — R194 Change in bowel habit: Secondary | ICD-10-CM | POA: Diagnosis not present

## 2021-07-11 ENCOUNTER — Encounter (HOSPITAL_COMMUNITY): Payer: Self-pay

## 2021-07-26 ENCOUNTER — Other Ambulatory Visit: Payer: Self-pay

## 2021-07-26 ENCOUNTER — Encounter: Payer: Self-pay | Admitting: Internal Medicine

## 2021-07-26 ENCOUNTER — Other Ambulatory Visit: Payer: Self-pay | Admitting: *Deleted

## 2021-07-26 ENCOUNTER — Ambulatory Visit (INDEPENDENT_AMBULATORY_CARE_PROVIDER_SITE_OTHER): Payer: Medicare PPO | Admitting: Internal Medicine

## 2021-07-26 VITALS — BP 116/64 | HR 74 | Temp 98.4°F | Ht 63.5 in | Wt 131.0 lb

## 2021-07-26 DIAGNOSIS — Z87891 Personal history of nicotine dependence: Secondary | ICD-10-CM

## 2021-07-26 DIAGNOSIS — J449 Chronic obstructive pulmonary disease, unspecified: Secondary | ICD-10-CM

## 2021-07-26 DIAGNOSIS — X3901XD Exposure to radon, subsequent encounter: Secondary | ICD-10-CM

## 2021-07-26 DIAGNOSIS — J4530 Mild persistent asthma, uncomplicated: Secondary | ICD-10-CM

## 2021-07-26 DIAGNOSIS — R918 Other nonspecific abnormal finding of lung field: Secondary | ICD-10-CM | POA: Diagnosis not present

## 2021-07-26 LAB — PULMONARY FUNCTION TEST
DL/VA % pred: 105 %
DL/VA: 4.4 ml/min/mmHg/L
DLCO cor % pred: 99 %
DLCO cor: 17.6 ml/min/mmHg
DLCO unc % pred: 99 %
DLCO unc: 17.6 ml/min/mmHg
FEF 25-75 Post: 1.61 L/sec
FEF 25-75 Pre: 1.25 L/sec
FEF2575-%Change-Post: 28 %
FEF2575-%Pred-Post: 111 %
FEF2575-%Pred-Pre: 86 %
FEV1-%Change-Post: 6 %
FEV1-%Pred-Post: 94 %
FEV1-%Pred-Pre: 89 %
FEV1-Post: 1.76 L
FEV1-Pre: 1.65 L
FEV1FVC-%Change-Post: 0 %
FEV1FVC-%Pred-Pre: 100 %
FEV6-%Change-Post: 5 %
FEV6-%Pred-Post: 98 %
FEV6-%Pred-Pre: 93 %
FEV6-Post: 2.34 L
FEV6-Pre: 2.21 L
FEV6FVC-%Pred-Post: 105 %
FEV6FVC-%Pred-Pre: 105 %
FVC-%Change-Post: 5 %
FVC-%Pred-Post: 93 %
FVC-%Pred-Pre: 88 %
FVC-Post: 2.34 L
FVC-Pre: 2.21 L
Post FEV1/FVC ratio: 75 %
Post FEV6/FVC ratio: 100 %
Pre FEV1/FVC ratio: 75 %
Pre FEV6/FVC Ratio: 100 %
RV % pred: 111 %
RV: 2.5 L
TLC % pred: 101 %
TLC: 4.85 L

## 2021-07-26 NOTE — Progress Notes (Signed)
Full PFT performed today. °

## 2021-07-26 NOTE — Patient Instructions (Signed)
Full PFT performed today. °

## 2021-07-26 NOTE — Progress Notes (Signed)
IOV 09/04/2017  Chief Complaint  Patient presents with   Consult    Referred by Dr. Inda Merlin for cough.  Pt states she had an episode beginning 12/1 which lasted for two weeks and then in mid January 2019 had another episode with cough. Denies any current complaints of cough, or CP but has some mild SOB and has some mucus in chest/throat.  Pt is currently taking prednisone.   76 year old patient referred by Dr. Inda Merlin for evaluation of cough.  History is gained from talking to her and review of referral records.  Patient tells me that at baseline she never had any cough or shortness of breath but on deeper questioning she did admit to very mild dyspnea on exertion going on for some months or years.  But it was barely noticeable.  Then in early December 2000 and she did have an episode of bronchitis that resulted in cough lasting a few weeks.  This resolved.  Then in January 2019 she had another episode of bronchitis and the cough is still lingered as of this date.  She initially required a round of antibiotic and later prednisone which she says she did not take correctly.  Then in February another course of prednisone.  Currently on third course of prednisone for a few days and has several more days to go.  With this third course of prednisone she is beginning to feel better.  Cough was initially severe and associated with inability to lie flat and wheezing.  But with his third round of prednisone it is now resolved to mild and she is able to sleep in her bed shortness of breath is around the same there is no sputum although review of the records indicate that she did have some green sputum in the past.  She is not on any maintenance inhalers but has taken albuterol in the past with relief.  Exposure history: She lives in a 78 year old home and is lived in the same home for 30 years.  She has noted some mold in the windows.  In addition she uses a wood stove in the basement during the winter season  which is been doing for many years.  Also has a history of radon exposure in the same home when she got it tested over 10 years ago.  She is also been a smoker 80 pack smoking history having quit within the last 15 years or less.     OV 10/16/2017  Chief Complaint  Patient presents with   Follow-up    PFT done 4/12 and HRCT done 5/7.  Pt states when she was on prednisone, the cough and wheezing stopped. Around Easter, pt's cough came back bad. Pt states it is hard for her to cough mucus up but has it in her chest.    Follow-up chronic cough in the setting of radon exposure, prior 80 pack smoking history and more recent mold exposure.  At last visit when I saw her for the first time in April 2019 exam nitric oxide was slightly in the gray zone and in the setting of prednisone her cough is getting better.  She returns now after finishing the prednisone and completing work-up with high-resolution CT chest and pulmonary function test.  She had her pulmonary function test September 11, 2017 prior to Easter and this is normal and I personally visualized the image.  She did have high-resolution CT chest in May 2019 and there is no interstitial lung disease oror cancer.  She has tiny 4 mm lung nodule and some really mild emphysema.  However she tells me that after she finished her prednisone around Easter 2019 her cough returned with a vengeance and she has had significant wheezing.  In fact her cough symptom score documented below shows severe amount of symptoms.  She has nocturnal cough.  So we retested exam nitric oxide today and is significantly elevated at 59 ppb   IMPRESSION: - personally visualized image and agree with findings 1. No definitive evidence to suggest interstitial lung disease. There are some areas of parenchymal banding in the lung bases bilaterally which are strongly favored to reflect areas of chronic post infectious or inflammatory scarring. 2. Mild diffuse bronchial thickening with  mild centrilobular and paraseptal emphysema; imaging findings suggestive of underlying COPD. 3. Multiple tiny 2-4 mm pulmonary nodules scattered throughout both lungs, nonspecific, but statistically likely benign. No follow-up needed if patient is low-risk (and has no known or suspected primary neoplasm). Non-contrast chest CT can be considered in 12 months if patient is high-risk. This recommendation follows the consensus statement: Guidelines for Management of Incidental Pulmonary Nodules Detected on CT Images: From the Fleischner Society 2017; Radiology 2017; 284:228-243. 4. Aortic atherosclerosis, in addition to 2 vessel coronary artery disease. Assessment for potential risk factor modification, dietary therapy or pharmacologic therapy may be warranted, if clinically indicated.   Aortic Atherosclerosis (ICD10-I70.0) and Emphysema (ICD10-J43.9).     Electronically Signed   By: Vinnie Langton M.D.   On: 10/06/2017 16:30      OV 11/18/2017  Chief Complaint  Patient presents with   Follow-up    6/15 Sinus started to drain yellow in color. Having chest congestion, wheezing coughing productive yellow mucus. She had some left over doxycline and has taken 50mg  qd for 8 days now.   Dominique Cobb , 76 y.o. , with dob September 19, 1945 and female ,Not Hispanic or Latino from [address]  [place] [address] - presents to lung clinic for chronic cough - cough variant asthma/midl radiologic emphsyema + lung nodule 54mm with prior 80ppd smokhng hx  -Last visit I started her on Symbicort and given prednisone. After this the cough resolved essentially and the wheezing completely resolved. This happened to the point that she actually stopped taking her Symbicort and then she she developed acute sinus infection with yellow sinus drainage and change in voiceand ear blockage. She had some leftover doxycycline 50 mg tablets given earlier sometime back by her primary care physician assistant. She  started taking this at 50 mg once a day and is only some better. She now feels the cough is descended into her lungs and is having some wheezing. Exhaled nitric oxide today is normal but she feels she'll benefit from prednisone. RSI cough score deteriorated and is documented below.    OV 01/22/2018  Chief Complaint  Patient presents with   Follow-up    Pt states she has been doing good since last visit and denies any complaints or concerns.   For cough. Asthma with nodule and emphysema  She is doing really well. Exhaled nitric oxide is 10 ppb. RSI cough score is minimally symptomatic. The main issues that she cut her right hand with glass and she's lost significant function. She has 28 stitches. She is going to start occupational therapy. SHe is open to reducing her be escalating Symbicort 2 inhaled steroid alone.   Feno 09/04/2017 - 29 ppb (on prednsione) ->  Repeat FeNO 10/16/2017 ->  59  OV 06/10/2018  Subjective:  Patient ID: Dominique Cobb, female , DOB: [birth year] , age 20 y.o. , MRN: [id number] , ADDRESS: [address] [place] [address]   06/10/2018 -   Chief Complaint  Patient presents with   Follow-up    Pt states she had been doing good until the first of January 2020. States she has a cough with yellow mucus and clear sinus drainage. Pt states she does have some mild chest tightness.     HPI Dominique Cobb 76 y.o. -here on an acute visit.  She has asthma with emphysema and a lung nodule  She tells me that the house she lives [place] road and has timber dust and she is constantly being exposed to it but in reality for the holiday she went to [place] and after the new year 2020 she developed respiratory infection with sinus congestion that is distended down to her chest and is causing coughing, wheezing and no sputum.  She is only somewhat better but she still has a wheeze.  She continues to be on Pulmicort.  She says that compared to previous times she is still able  to sleep because of pulmicort   07/20/2020  76 year old female, former smoker quit in 2006 (80 pack year hx). PMH significant for asthma, pulmonary nodules, emphysema. Patient of Dr. Chase Caller, last seen in office on 06/10/18. Maintained on Pulmicort 183mcg, albuterol hfa and flonase nasal spray.    Patient presents today for overdue follow-up for asthma. She is needing inhaler medication refills. She is complaint with pulmicort inhaler. If she misses Pulmicort dose she does notice a little more wheezing in the morning. She has not require Ventolin rescue inhaler. She had a wood stove at home but this has been removed. CT chest in June 2020 showed biapical pleural-parenchymal scarring, mild centrilobular and paraseptal emphysema and small subpleural nodules bilaterally measuring 2-89mm and considered benign.    Pulmonary testing: PFTs- FVC 2.78 (106%), FEV1 2.14 (109%), ratio 77, DLCOunc 19.10 (88%), no BD response  Feno 09/04/2017 - 29 ppb (on prednsione) ->  Repeat FeNO 10/16/2017 ->  59   Imaging: CT chest June 2020- small subpleural nodules bilaterally measuring 2-28mm considered benign, no dedicated  follow-up necessary.      OV 07/26/2021  Subjective:  Patient ID: Dominique Cobb, female , DOB: 1945-06-08 , age 84 y.o. , MRN: [id number] , ADDRESS: [address] [place] [address] PCP Josetta Huddle, MD Patient Care Team: Josetta Huddle, MD as PCP - General (Internal Medicine) Brien Mates, RN as Oncology Nurse Navigator (Oncology)  This Provider for this visit: Treatment Team:  Attending Provider: Brand Males, MD    07/26/2021 -   Chief Complaint  Patient presents with   Follow-up    PFT performed today.  Pt states she has been doing okay since last visit and denies any complaints.   Asthma follow-up History of outpatient mild COVID summer 2022 History of [place] hernia  HPI CAYLAN CHENARD 76 y.o. -last seen by myself in the beginning of the pandemic  approximately 3 years ago.  Seen by nurse practitioner 1 year ago.  In the interim in the summer 2022 she had outpatient COVID.  She had GI symptoms.  She took antiviral she got better.  Husband also had COVID.  But she is really here for asthma follow-up.  She says she is doing well.  She no longer is on Pulmicort because of insurance issues with  coverage.  She is now on Flovent which is costing her $40 per month co-pay.  She is waiting for Meda Coffee to come up with his pharmacy so she can get better pricing.  In the interim she has not had any acute exacerbations.  No emergency room visits.  No urgent care visits no albuterol rescue usage no nocturnal awakenings.  ACT score shows really good control.  Of note she is 24.  She used to smoke in the past and she quit 17 years ago.  She today told me that she did have radon exposure in the house for 15 years but she fixed this 20 years ago.  In the last CT scan 2 years ago have some small micronodules.  We discussed about following this.  She is open to the idea.  She is worried about lung cancer risk.  This is particularly because one of her cousins is actively dying from lung cancer and was a non-smoker. Asthma Control Test ACT Total Score  07/26/2021 25  07/20/2020 21     Lab Results  Component Value Date   NITRICOXIDE 10 01/22/2018     Asthma Control Panel 06/10/2018   Current Med Regimen   ACQ 5 point- 1 week. wtd avg score. <1.0 is good control 0.75-1.25 is grey zone. >1.25 poor control. Delta 0.5 is clinically meaningful 1.4  FeNO ppB 8  FeV1    Planned intervention  for visit    PFT  PFT Results Latest Ref Rng & Units 07/26/2021 09/11/2017  FVC-Pre L 2.21 2.81  FVC-Predicted Pre % 88 107  FVC-Post L 2.34 2.78  FVC-Predicted Post % 93 106  Pre FEV1/FVC % % 75 74  Post FEV1/FCV % % 75 77  FEV1-Pre L 1.65 2.09  FEV1-Predicted Pre % 89 107  FEV1-Post L 1.76 2.14  DLCO uncorrected ml/min/mmHg 17.60 19.10  DLCO UNC% % 99 88  DLCO  corrected ml/min/mmHg 17.60 -  DLCO COR %Predicted % 99 -  DLVA Predicted % 105 90  TLC L 4.85 5.09  TLC % Predicted % 101 107  RV % Predicted % 111 85       has a past medical history of Anxiety, Aortic atherosclerosis ([place]), Arthritis, Asthma, Depression, Dysphagia, Dyspnea, Family history of adverse reaction to anesthesia, and GERD (gastroesophageal reflux disease).   reports that she quit smoking about 16 years ago. Her smoking use included cigarettes. She has a 80.00 pack-year smoking history. She has never used smokeless tobacco.  Past Surgical History:  Procedure Laterality Date   BREAST CYST ASPIRATION Left    BREAST EXCISIONAL BIOPSY Left    BREAST LUMPECTOMY WITH RADIOACTIVE SEED AND SENTINEL LYMPH NODE BIOPSY Left 03/08/2020   Procedure: LEFT BREAST LUMPECTOMY WITH RADIOACTIVE SEED AND LEFT AXILLARY SENTINEL LYMPH NODE BIOPSY;  Surgeon: Rolm Bookbinder, MD;  Location: [facility];  Service: General;  Laterality: Left;  PEC BLOCK   BREAST SURGERY     benign.  25 yrs ago   [place]  1980's   bilateral    CATARACT EXTRACTION W/PHACO Left 06/12/2016   Procedure: CATARACT EXTRACTION PHACO AND INTRAOCULAR LENS PLACEMENT LEFT EYE FVC9.44;  Surgeon: Tonny Branch, MD;  Location[facility];  Service: Ophthalmology;  Laterality: Left;  left   CATARACT EXTRACTION W/PHACO Right 07/24/2016   Procedure: CATARACT EXTRACTION PHACO AND INTRAOCULAR LENS PLACEMENT (IOC);  Surgeon: Tonny Branch, MD;  Location: [facility];  Service: Ophthalmology;  Laterality: Right;  right cde 8.29   COLONOSCOPY N/A 10/29/2012  Procedure: COLONOSCOPY;  Surgeon: Rogene Houston, MD;  [facility];  Service: Endoscopy;  Laterality: N/A;  140   Dental implants     FINGER SURGERY     trigger finger 3rd  bilaterally   laproscopy     over 20 yrs ago   NECK SURGERY     2007   TONSILLECTOMY      Allergies  Allergen Reactions   Codeine Other (See Comments)    Sweating and Patient just doesn't like.      Immunization History  Administered Date(s) Administered   Influenza Split 02/07/2020   Influenza, High Dose Seasonal PF 03/02/2017, 03/04/2018, 02/01/2019, 02/19/2021   Influenza-Unspecified 02/03/2019   Moderna Sars-Covid-2 Vaccination 04/04/2020, 10/16/2020   PFIZER(Purple Top)SARS-COV-2 Vaccination 07/20/2019, 08/10/2019   Zoster Recombinat (Shingrix) 05/17/2018    Family History  Problem Relation Age of Onset   Alcoholism Mother    Depression Mother    Arthritis Father    Thyroid disease Father    Heart attack Paternal Uncle    Hypertension Maternal Grandmother    Dementia Maternal Grandmother    Heart attack Maternal Grandfather    Arthritis Maternal Grandfather    Hip fracture Paternal Grandmother      Current Outpatient Medications:    albuterol (VENTOLIN HFA) 108 (90 Base) MCG/ACT inhaler, Inhale 2 puffs into the lungs every 6 (six) hours as needed for wheezing or shortness of breath., Disp: 1 Inhaler, Rfl: 6   fluticasone (FLONASE) 50 MCG/ACT nasal spray, Place 1 spray into both nostrils daily., Disp: 16 g, Rfl: 3   fluticasone (FLOVENT HFA) 220 MCG/ACT inhaler, Inhale 2 puffs into the lungs daily., Disp: 1 each, Rfl: 5   ibuprofen (ADVIL) 400 MG tablet, Take 400 mg by mouth daily as needed for moderate pain. , Disp: , Rfl:    Multiple Minerals-Vitamins (CALCIUM-MAGNESIUM-ZINC-D3) TABS, , Disp: , Rfl:    rosuvastatin (CRESTOR) 5 MG tablet, Take 5 mg by mouth 3 (three) times a week., Disp: , Rfl:    sertraline (ZOLOFT) 100 MG tablet, Take 1.5 tablets by mouth daily., Disp: , Rfl:    tamoxifen (NOLVADEX) 20 MG tablet, TAKE 1 TABLET BY MOUTH EVERY DAY, Disp: 90 tablet, Rfl: 3   zolpidem (AMBIEN) 5 MG tablet, Take 5 mg by mouth at bedtime as needed for sleep. , Disp: , Rfl:       Objective:   Vitals:   07/26/21 1501  BP: 116/64  Pulse: 74  Temp: 98.4 F (36.9 C)  TempSrc: Oral  SpO2: 98%  Weight: 131 lb (59.4 kg)  Height: 5' 3.5" (1.613 m)    Estimated  body mass index is 22.84 kg/m as calculated from the following:   Height as of this encounter: 5' 3.5" (1.613 m).   Weight as of this encounter: 131 lb (59.4 kg).  @WEIGHTCHANGE @  [employer]   07/26/21 1501  Weight: 131 lb (59.4 kg)     Physical Exam  General: No distress. Looks well Neuro: Alert and Oriented x 3. GCS 15. Speech normal Psych: Pleasant Resp:  Barrel Chest - no.  Wheeze - no, Crackles - no, No overt respiratory distress CVS: Normal heart sounds. Murmurs - no Ext: Stigmata of Connective Tissue Disease - no HEENT: Normal upper airway. PEERL +. No post nasal drip        Assessment:     No diagnosis found.     Plan:     Patient Instructions  Asthma will controlled - mild persistent  Well controllled PFT normal 07/26/2021  Plan  - continue flovent as scheduled daily  - use albuterol as needed   Lung nodule < 6cm on CT in 2020 Prior smoker and passive smoking hisotyr History of radon exposure Family history of lung cancer - cousin  - do ct scan chest without contrast next few weeks - will call with results  Followup  - will callwith CT results  -return 6 months for routine followup     SIGNATURE    Dr. Brand Males, M.D., F.C.C.P,  Pulmonary and Critical Care Medicine Staff Physician, [place] Director - Interstitial Lung Disease  Program  [facility] [place] [facility] [place], [place], [address]  Pager: [phone], If no answer or between  15:00h - 7:00h: call [phone] Telephone: [phone]  3:31 PM 07/26/2021

## 2021-07-26 NOTE — Patient Instructions (Addendum)
Asthma will controlled - mild persistent  Well controllled PFT normal 07/26/2021  Plan  - continue flovent as scheduled daily  - use albuterol as needed   Lung nodule < 6cm on CT in 2020 Prior smoker and passive smoking hisotyr History of radon exposure Family history of lung cancer - cousin  - do ct scan chest without contrast next few weeks - will call with results  Followup  - will callwith CT results  -return 6 months for routine followup

## 2021-08-12 ENCOUNTER — Other Ambulatory Visit: Payer: Self-pay | Admitting: Internal Medicine

## 2021-08-19 ENCOUNTER — Other Ambulatory Visit: Payer: Self-pay | Admitting: General Surgery

## 2021-08-19 ENCOUNTER — Other Ambulatory Visit: Payer: Self-pay | Admitting: Internal Medicine

## 2021-08-19 DIAGNOSIS — Z9889 Other specified postprocedural states: Secondary | ICD-10-CM

## 2021-08-19 DIAGNOSIS — Z1231 Encounter for screening mammogram for malignant neoplasm of breast: Secondary | ICD-10-CM

## 2021-08-20 ENCOUNTER — Encounter: Payer: Self-pay | Admitting: Podiatry

## 2021-08-20 ENCOUNTER — Ambulatory Visit: Payer: Medicare PPO | Admitting: Podiatry

## 2021-08-20 ENCOUNTER — Other Ambulatory Visit: Payer: Self-pay

## 2021-08-20 DIAGNOSIS — L84 Corns and callosities: Secondary | ICD-10-CM

## 2021-08-20 DIAGNOSIS — Q6671 Congenital pes cavus, right foot: Secondary | ICD-10-CM | POA: Diagnosis not present

## 2021-08-20 DIAGNOSIS — M7741 Metatarsalgia, right foot: Secondary | ICD-10-CM

## 2021-08-20 DIAGNOSIS — M7742 Metatarsalgia, left foot: Secondary | ICD-10-CM | POA: Diagnosis not present

## 2021-08-20 DIAGNOSIS — Q6672 Congenital pes cavus, left foot: Secondary | ICD-10-CM | POA: Diagnosis not present

## 2021-08-20 NOTE — Progress Notes (Signed)
?  Subjective:  ?Patient ID: Dominique Cobb, female    DOB: Mar 13, 1946,   MRN: [id number] ? ?No chief complaint on file. ? ? ?76 y.o. female presents for concern of pain in the ball of her foot that has been going on for several months. Had tried some new boots with heels that started a lot of the pain. She also has some calluses she is concerned about . Denies any other pedal complaints. Denies n/v/f/c.  ? ?Past Medical History:  ?Diagnosis Date  ? Anxiety   ? Aortic atherosclerosis ([place])   ? Arthritis   ? Asthma   ? Depression   ? Dysphagia   ? Dyspnea   ? on exertion  ? Family history of adverse reaction to anesthesia   ? father's lung collapsed during lumbar surgery  ? GERD (gastroesophageal reflux disease)   ? ? ?Objective:  ?Physical Exam: ?Vascular: DP/PT pulses 2/4 bilateral. CFT <3 seconds. Normal hair growth on digits. No edema.  ?Skin. No lacerations or abrasions bilateral feet. Hyperkeratotic lesion noted sub second right metatarsal and sub fifth metatarsal on left.  ?Musculoskeletal: MMT 5/5 bilateral lower extremities in DF, PF, Inversion and Eversion. Deceased ROM in DF of ankle joint. Minimal tenderness to the ball of the foot. Pes cavus foot type noted.  ?Neurological: Sensation intact to light touch.  ? ?Assessment:  ? ?1. Metatarsalgia of both feet   ?2. Corns and callosities   ? ? ? ?Plan:  ?Patient was evaluated and treated and all questions answered. ?Discussed metatarsalgia and callues and treatment options with patient.  ?Hyperkeratotic lesions trimmed today as courtesy without incident.   ?Discussed padding and offloading today.  ?Discussed supportive shoes and inserts.  ?Follow-up as needed.  ? ? ? ?Lorenda Peck, DPM  ? ? ?

## 2021-08-29 ENCOUNTER — Ambulatory Visit
Admission: RE | Admit: 2021-08-29 | Discharge: 2021-08-29 | Disposition: A | Discharge status: Home / Self Care | Payer: Medicare PPO | Source: Ambulatory Visit | Attending: General Surgery | Admitting: General Surgery

## 2021-08-29 DIAGNOSIS — R922 Inconclusive mammogram: Secondary | ICD-10-CM | POA: Diagnosis not present

## 2021-08-29 DIAGNOSIS — Z9889 Other specified postprocedural states: Secondary | ICD-10-CM

## 2021-09-02 ENCOUNTER — Ambulatory Visit (HOSPITAL_COMMUNITY)
Admission: RE | Admit: 2021-09-02 | Discharge: 2021-09-02 | Disposition: A | Discharge status: Home / Self Care | Payer: Medicare PPO | Source: Ambulatory Visit | Attending: Internal Medicine | Admitting: Internal Medicine

## 2021-09-02 DIAGNOSIS — R918 Other nonspecific abnormal finding of lung field: Secondary | ICD-10-CM | POA: Diagnosis not present

## 2021-09-02 DIAGNOSIS — J432 Centrilobular emphysema: Secondary | ICD-10-CM | POA: Diagnosis not present

## 2021-09-03 DIAGNOSIS — R194 Change in bowel habit: Secondary | ICD-10-CM | POA: Diagnosis not present

## 2021-09-03 DIAGNOSIS — R3915 Urgency of urination: Secondary | ICD-10-CM | POA: Diagnosis not present

## 2021-09-03 DIAGNOSIS — R3589 Other polyuria: Secondary | ICD-10-CM | POA: Diagnosis not present

## 2021-09-03 DIAGNOSIS — R140 Abdominal distension (gaseous): Secondary | ICD-10-CM | POA: Diagnosis not present

## 2021-09-03 DIAGNOSIS — R634 Abnormal weight loss: Secondary | ICD-10-CM | POA: Diagnosis not present

## 2021-09-03 DIAGNOSIS — I2510 Atherosclerotic heart disease of native coronary artery without angina pectoris: Secondary | ICD-10-CM | POA: Diagnosis not present

## 2021-09-05 DIAGNOSIS — I2510 Atherosclerotic heart disease of native coronary artery without angina pectoris: Secondary | ICD-10-CM | POA: Diagnosis not present

## 2021-09-17 DIAGNOSIS — Z23 Encounter for immunization: Secondary | ICD-10-CM | POA: Diagnosis not present

## 2021-09-23 ENCOUNTER — Ambulatory Visit: Payer: Medicare PPO | Attending: General Surgery

## 2021-09-23 DIAGNOSIS — Z483 Aftercare following surgery for neoplasm: Secondary | ICD-10-CM | POA: Insufficient documentation

## 2021-09-23 DIAGNOSIS — H903 Sensorineural hearing loss, bilateral: Secondary | ICD-10-CM | POA: Diagnosis not present

## 2021-09-23 NOTE — Therapy (Signed)
?OUTPATIENT PHYSICAL THERAPY SOZO SCREENING NOTE ? ? ?Patient Name: Dominique Cobb ?MRN: [id number] ?DOB:July 15, 1945, 76 y.o., female ?Today's Date: 09/23/2021 ? ?PCP: Josetta Huddle, MD ?REFERRING PROVIDER: Rolm Bookbinder, MD ? ? PT End of Session - 09/23/21 1057   ? ? Visit Number 9   # unchanged due to screen only  ? PT Start Time 1053   ? PT Stop Time 1101   ? PT Time Calculation (min) 8 min   ? Activity Tolerance Patient tolerated treatment well   ? Behavior During Therapy [facility] for tasks assessed/performed   ? ?  ?  ? ?  ? ? ?Past Medical History:  ?Diagnosis Date  ? Anxiety   ? Aortic atherosclerosis ([place])   ? Arthritis   ? Asthma   ? Depression   ? Dysphagia   ? Dyspnea   ? on exertion  ? Family history of adverse reaction to anesthesia   ? father's lung collapsed during lumbar surgery  ? GERD (gastroesophageal reflux disease)   ? ?Past Surgical History:  ?Procedure Laterality Date  ? BREAST CYST ASPIRATION Left   ? BREAST EXCISIONAL BIOPSY Left   ? BREAST LUMPECTOMY WITH RADIOACTIVE SEED AND SENTINEL LYMPH NODE BIOPSY Left 03/08/2020  ? Procedure: LEFT BREAST LUMPECTOMY WITH RADIOACTIVE SEED AND LEFT AXILLARY SENTINEL LYMPH NODE BIOPSY;  Surgeon: Rolm Bookbinder, MD;  Location: [facility];  Service: General;  Laterality: Left;  PEC BLOCK  ? BREAST SURGERY    ? benign.  25 yrs ago  ? CARPAL TUNNEL RELEASE  1980's  ? bilateral   ? CATARACT EXTRACTION W/PHACO Left 06/12/2016  ? Procedure: CATARACT EXTRACTION PHACO AND INTRAOCULAR LENS PLACEMENT LEFT EYE CZY6.06;  Surgeon: Tonny Branch, MD;  [facility];  Service: Ophthalmology;  Laterality: Left;  left  ? CATARACT EXTRACTION W/PHACO Right 07/24/2016  ? Procedure: CATARACT EXTRACTION PHACO AND INTRAOCULAR LENS PLACEMENT (IOC);  Surgeon: Tonny Branch, MD;  [facility];  Service: Ophthalmology;  Laterality: Right;  right ?cde 8.29  ? COLONOSCOPY N/A 10/29/2012  ? Procedure: COLONOSCOPY;  Surgeon: Rogene Houston, MD;  [facility];  Service: Endoscopy;   Laterality: N/A;  140  ? Dental implants    ? FINGER SURGERY    ? trigger finger 3rd  bilaterally  ? laproscopy    ? over 20 yrs ago  ? NECK SURGERY    ? 2007  ? TONSILLECTOMY    ? ?Patient Active Problem List  ? Diagnosis Date Noted  ? Asthma 07/23/2020  ? Pulmonary nodules 07/23/2020  ? Centrilobular emphysema ([place]) 07/23/2020  ? Breast cancer of lower-outer quadrant of left female breast ([place]) 02/15/2020  ? Change in stool 10/15/2012  ? Dysphagia 03/20/2011  ? GERD (gastroesophageal reflux disease) 03/20/2011  ? Depression 03/20/2011  ? ? ?REFERRING DIAG: left breast cancer at risk for lymphedema ? ?THERAPY DIAG:  ?Aftercare following surgery for neoplasm ? ?PERTINENT HISTORY: L breast cancer, L breast lumpectomy and SLNB (2 both negative) 03/08/2020 ER/PR+/HER2-, previous L breast lumpectomy with lymph node biopsy that turned out to not be cancer  ? ?PRECAUTIONS: left UE Lymphedema risk, None ? ?SUBJECTIVE: Pt returns for her 3 month L-Dex screen.  ? ?PAIN:  ?Are you having pain? No ? ?SOZO SCREENING: ?Patient was assessed today using the SOZO machine to determine the lymphedema index score. This was compared to her baseline score. It was determined that she is within the recommended range when compared to her baseline and no further action is needed  at this time. She will continue SOZO screenings. These are done every 3 months for 2 years post operatively followed by every 6 months for 2 years, and then annually. ? ? ?Otelia Limes, PTA ?09/23/2021, 11:02 AM ? ?  ? ?

## 2021-10-15 ENCOUNTER — Ambulatory Visit (INDEPENDENT_AMBULATORY_CARE_PROVIDER_SITE_OTHER): Payer: Medicare PPO

## 2021-10-15 ENCOUNTER — Ambulatory Visit
Admission: EM | Admit: 2021-10-15 | Discharge: 2021-10-15 | Disposition: A | Discharge status: Home / Self Care | Payer: Medicare PPO | Attending: Family Medicine | Admitting: Family Medicine

## 2021-10-15 DIAGNOSIS — R0602 Shortness of breath: Secondary | ICD-10-CM | POA: Diagnosis not present

## 2021-10-15 DIAGNOSIS — J069 Acute upper respiratory infection, unspecified: Secondary | ICD-10-CM | POA: Diagnosis not present

## 2021-10-15 DIAGNOSIS — J441 Chronic obstructive pulmonary disease with (acute) exacerbation: Secondary | ICD-10-CM | POA: Diagnosis not present

## 2021-10-15 DIAGNOSIS — R062 Wheezing: Secondary | ICD-10-CM

## 2021-10-15 MED ORDER — BENZONATATE 200 MG PO CAPS: 200.0000 mg | ORAL_CAPSULE | Freq: Three times a day (TID) | ORAL | 0 refills | Status: DC | PRN

## 2021-10-15 MED ORDER — DEXAMETHASONE SODIUM PHOSPHATE 10 MG/ML IJ SOLN
10.0000 mg | Freq: Once | INTRAMUSCULAR | Status: AC
  Administered 2021-10-15: 10 mg via INTRAMUSCULAR

## 2021-10-15 NOTE — ED Triage Notes (Signed)
Pt states she has been diagnosed with Bronchitis about 4 years ago ? ?Pt states she went on trip at the end of April with some friends and she was exposed to bronchitis ? ?Pt states  she emailed her PCP and they sent over some Prednisone and Levofloxacin without much relief ? ?Pt decided to start taking some Mucinex and it help bring up some congestion ? ?Pt states she has a cough with rattle and fatigue ? ?

## 2021-10-19 NOTE — ED Provider Notes (Addendum)
[facility]    CSN: [id number] Arrival date & time: 10/15/21  1127      History   Chief Complaint Chief Complaint  Patient presents with   brochitis    Bronchitis flare up for the past week    HPI Dominique Cobb is a 76 y.o. female.   Presenting today with over a week of ongoing hacking productive cough, congestion, chest tightness, fatigue. States she was traveling in [place] when sxs began. PCP sent in levaquin and prednisone and she states she has almost completed these. Also trying some mucinex with minimal relief. Hx of bronchitis, emphysema, asthma on flovent and albuterol prn. Was traveling with someone sick with bronchitis.    Past Medical History:  Diagnosis Date   Anxiety    Aortic atherosclerosis (HCC)    Arthritis    Asthma    Depression    Dysphagia    Dyspnea    on exertion   Family history of adverse reaction to anesthesia    father's lung collapsed during lumbar surgery   GERD (gastroesophageal reflux disease)     Patient Active Problem List   Diagnosis Date Noted   Asthma 07/23/2020   Pulmonary nodules 07/23/2020   Centrilobular emphysema ([place]) 07/23/2020   Breast cancer of lower-outer quadrant of left female breast ([place]) 02/15/2020   Change in stool 10/15/2012   Dysphagia 03/20/2011   GERD (gastroesophageal reflux disease) 03/20/2011   Depression 03/20/2011    Past Surgical History:  Procedure Laterality Date   BREAST CYST ASPIRATION Left    BREAST EXCISIONAL BIOPSY Left    BREAST LUMPECTOMY WITH RADIOACTIVE SEED AND SENTINEL LYMPH NODE BIOPSY Left 03/08/2020   Procedure: LEFT BREAST LUMPECTOMY WITH RADIOACTIVE SEED AND LEFT AXILLARY SENTINEL LYMPH NODE BIOPSY;  Surgeon: Rolm Bookbinder, MD;  Location: [facility];  Service: General;  Laterality: Left;  PEC BLOCK   BREAST SURGERY     benign.  25 yrs ago   Dominique Cobb  1980's   bilateral    CATARACT EXTRACTION W/PHACO Left 06/12/2016   Procedure: CATARACT EXTRACTION PHACO  AND INTRAOCULAR LENS PLACEMENT LEFT EYE LOV5.64;  Surgeon: Tonny Branch, MD;  Location: [facility];  Service: Ophthalmology;  Laterality: Left;  left   CATARACT EXTRACTION W/PHACO Right 07/24/2016   Procedure: CATARACT EXTRACTION PHACO AND INTRAOCULAR LENS PLACEMENT (IOC);  Surgeon: Tonny Branch, MD;  Location: [facility];  Service: Ophthalmology;  Laterality: Right;  right cde 8.29   COLONOSCOPY N/A 10/29/2012   Procedure: COLONOSCOPY;  Surgeon: Rogene Houston, MD;  Location: [facility];  Service: Endoscopy;  Laterality: N/A;  140   Dental implants     FINGER SURGERY     trigger finger 3rd  bilaterally   laproscopy     over 20 yrs ago   NECK SURGERY     2007   TONSILLECTOMY      OB History   No obstetric history on file.      Home Medications    Prior to Admission medications   Medication Sig Start Date End Date Taking? Authorizing Provider  benzonatate (TESSALON) 200 MG capsule Take 1 capsule (200 mg total) by mouth 3 (three) times daily as needed for cough. 10/15/21  Yes Volney American, PA-C  albuterol (VENTOLIN HFA) 108 (90 Base) MCG/ACT inhaler Inhale 2 puffs into the lungs every 6 (six) hours as needed for wheezing or shortness of breath. 11/08/18   Brand Males, MD  FLOVENT HFA 220 MCG/ACT inhaler INHALE TWO PUFFS  BY MOUTH EVERY DAY 08/12/21   Brand Males, MD  fluticasone (FLONASE) 50 MCG/ACT nasal spray Place 1 spray into both nostrils daily. 05/08/20   Lauraine Rinne, NP  ibuprofen (ADVIL) 400 MG tablet Take 400 mg by mouth daily as needed for moderate pain.     [provider]  Multiple Minerals-Vitamins (CALCIUM-MAGNESIUM-ZINC-D3) TABS  04/28/21   [provider]  rosuvastatin (CRESTOR) 5 MG tablet Take 5 mg by mouth 3 (three) times a week.    [provider]  sertraline (ZOLOFT) 100 MG tablet Take 1.5 tablets by mouth daily.    [provider]  tamoxifen (NOLVADEX) 20 MG tablet TAKE 1 TABLET BY MOUTH EVERY DAY 04/12/21   Derek Jack, MD  zolpidem (AMBIEN) 5 MG tablet Take 5 mg by mouth at bedtime as needed for sleep.     [provider]    Family History Family History  Problem Relation Age of Onset   Alcoholism Mother    Depression Mother    Arthritis Father    Thyroid disease Father    Heart attack Paternal Uncle    Hypertension Maternal Grandmother    Dementia Maternal Grandmother    Heart attack Maternal Grandfather    Arthritis Maternal Grandfather    Hip fracture Paternal Grandmother     Social History Social History   Tobacco Use   Smoking status: Former    Packs/day: 2.00    Years: 40.00    Pack years: 80.00    Types: Cigarettes    Quit date: 09/04/2004    Years since quitting: 17.1   Smokeless tobacco: Never  Vaping Use   Vaping Use: Never used  Substance Use Topics   Alcohol use: Yes    Comment: occasionally+   Drug use: No     Allergies   Codeine   Review of Systems Review of Systems PER HPI  Physical Exam Triage Vital Signs ED Triage Vitals  Enc Vitals Group     BP 10/15/21 1243 127/78     Pulse Rate 10/15/21 1243 80     Resp 10/15/21 1243 16     Temp 10/15/21 1243 98 F (36.7 C)     Temp Source 10/15/21 1243 Oral     SpO2 10/15/21 1243 94 %     Weight --      Height --      Head Circumference --      Peak Flow --      Pain Score 10/15/21 1248 0     Pain Loc --      Pain Edu? --      Excl. in [place]? --    No data found.  Updated Vital Signs BP 127/78 (BP Location: Right Arm)   Pulse 80   Temp 98 F (36.7 C) (Oral)   Resp 16   SpO2 94%   Visual Acuity Right Eye Distance:   Left Eye Distance:   Bilateral Distance:    Right Eye Near:   Left Eye Near:    Bilateral Near:     Physical Exam Vitals and nursing note reviewed.  Constitutional:      Appearance: Normal appearance.  HENT:     Head: Atraumatic.     Right Ear: Tympanic membrane and external ear normal.     Left Ear: Tympanic membrane and external ear normal.     Nose:  Congestion present.     Mouth/Throat:     Mouth: Mucous membranes are moist.  Pharynx: Posterior oropharyngeal erythema present.  Eyes:     Extraocular Movements: Extraocular movements intact.     Conjunctiva/sclera: Conjunctivae normal.  Cardiovascular:     Rate and Rhythm: Normal rate and regular rhythm.     Heart sounds: Normal heart sounds.  Pulmonary:     Effort: Pulmonary effort is normal. No respiratory distress.     Breath sounds: Wheezing present.  Musculoskeletal:        General: Normal range of motion.     Cervical back: Normal range of motion and neck supple.  Skin:    General: Skin is warm and dry.  Neurological:     Mental Status: She is alert and oriented to person, place, and time.  Psychiatric:        Mood and Affect: Mood normal.        Thought Content: Thought content normal.     UC Treatments / Results  Labs (all labs ordered are listed, but only abnormal results are displayed) Labs Reviewed - No data to display  EKG   Radiology No results found.  Procedures Procedures (including critical care time)  Medications Ordered in UC Medications  dexamethasone (DECADRON) injection 10 mg (10 mg Intramuscular Given by Other 10/15/21 1337)    Initial Impression / Assessment and Plan / UC Course  I have reviewed the triage vital signs and the nursing notes.  Pertinent labs & imaging results that were available during my care of the patient were reviewed by me and considered in my medical decision making (see chart for details).     X-ray negative for pneumonia, suspect COPD exacerbation. IM decadron, tessalon, and complete course of levaquin. Return for worsening sxs.  Final Clinical Impressions(s) / UC Diagnoses   Final diagnoses:  SOB (shortness of breath)  COPD exacerbation (HCC)  Viral URI with cough   Discharge Instructions   None    ED Prescriptions     Medication Sig Dispense Auth. Provider   benzonatate (TESSALON) 200 MG capsule  Take 1 capsule (200 mg total) by mouth 3 (three) times daily as needed for cough. 20 capsule Volney American, [place]      PDMP not reviewed this encounter.   Volney American, [place] 10/19/21 [place], [place], [place] 10/19/21 2204

## 2021-10-29 ENCOUNTER — Other Ambulatory Visit: Payer: Self-pay | Admitting: Physician Assistant

## 2021-10-29 DIAGNOSIS — K5900 Constipation, unspecified: Secondary | ICD-10-CM | POA: Diagnosis not present

## 2021-10-29 DIAGNOSIS — Z1211 Encounter for screening for malignant neoplasm of colon: Secondary | ICD-10-CM | POA: Diagnosis not present

## 2021-10-29 DIAGNOSIS — R634 Abnormal weight loss: Secondary | ICD-10-CM | POA: Diagnosis not present

## 2021-10-29 DIAGNOSIS — J449 Chronic obstructive pulmonary disease, unspecified: Secondary | ICD-10-CM | POA: Diagnosis not present

## 2021-10-29 DIAGNOSIS — R1310 Dysphagia, unspecified: Secondary | ICD-10-CM | POA: Diagnosis not present

## 2021-10-29 DIAGNOSIS — R198 Other specified symptoms and signs involving the digestive system and abdomen: Secondary | ICD-10-CM | POA: Diagnosis not present

## 2021-10-29 DIAGNOSIS — R140 Abdominal distension (gaseous): Secondary | ICD-10-CM | POA: Diagnosis not present

## 2021-11-08 ENCOUNTER — Ambulatory Visit
Admission: RE | Admit: 2021-11-08 | Discharge: 2021-11-08 | Disposition: A | Discharge status: Home / Self Care | Payer: Medicare PPO | Source: Ambulatory Visit | Attending: Physician Assistant | Admitting: Physician Assistant

## 2021-11-08 DIAGNOSIS — R1310 Dysphagia, unspecified: Secondary | ICD-10-CM | POA: Diagnosis not present

## 2021-11-08 DIAGNOSIS — K224 Dyskinesia of esophagus: Secondary | ICD-10-CM | POA: Diagnosis not present

## 2021-11-14 ENCOUNTER — Telehealth: Payer: Self-pay | Admitting: Internal Medicine

## 2021-11-14 NOTE — Telephone Encounter (Signed)
She only has mild asthma. If no wheezing and asthma is stable without flare up in the week prior to endoscopy and on day of procedure she is stable the procedure should be LOW risk

## 2021-11-14 NOTE — Telephone Encounter (Signed)
Fax received from Deliah Goody- PA-C with [facility] to perform a Endoscopy/Colonoscopy on patient.  Patient needs surgery clearance. Patient was seen on 07/26/2021. Office protocol is a risk assessment can be sent to surgeon if patient has been seen in 60 days or less.   Sending to Dr. Chase Caller for risk assessment or recommendations if patient needs to be seen in office prior to surgical procedure.

## 2021-11-19 DIAGNOSIS — Z1212 Encounter for screening for malignant neoplasm of rectum: Secondary | ICD-10-CM | POA: Diagnosis not present

## 2021-11-19 DIAGNOSIS — Z1211 Encounter for screening for malignant neoplasm of colon: Secondary | ICD-10-CM | POA: Diagnosis not present

## 2021-11-25 LAB — COLOGUARD: COLOGUARD: NEGATIVE

## 2021-11-28 NOTE — Telephone Encounter (Signed)
OV notes and clearance form have been faxed back to [facility]. Nothing further needed at this time.  

## 2021-12-31 DIAGNOSIS — K639 Disease of intestine, unspecified: Secondary | ICD-10-CM | POA: Diagnosis not present

## 2021-12-31 DIAGNOSIS — Z1211 Encounter for screening for malignant neoplasm of colon: Secondary | ICD-10-CM | POA: Diagnosis not present

## 2021-12-31 DIAGNOSIS — R1310 Dysphagia, unspecified: Secondary | ICD-10-CM | POA: Diagnosis not present

## 2022-02-02 NOTE — Progress Notes (Signed)
Sorry for delay wtiht results. Raquel Sarna please give results that nodule are stable April 2023 v June 2020 and are small. Therefore benign. Changes of emphysema, Coronary art calcificatina dn aorticl valve calcification + . PLAN - get echo and give first avail ROV

## 2022-02-05 ENCOUNTER — Other Ambulatory Visit: Payer: Self-pay | Admitting: Internal Medicine

## 2022-02-05 ENCOUNTER — Ambulatory Visit
Admission: RE | Admit: 2022-02-05 | Discharge: 2022-02-05 | Disposition: A | Discharge status: Home / Self Care | Payer: Medicare PPO | Source: Ambulatory Visit | Attending: Internal Medicine | Admitting: Internal Medicine

## 2022-02-05 DIAGNOSIS — M25551 Pain in right hip: Secondary | ICD-10-CM | POA: Diagnosis not present

## 2022-02-05 DIAGNOSIS — M25552 Pain in left hip: Secondary | ICD-10-CM

## 2022-02-05 DIAGNOSIS — Z0000 Encounter for general adult medical examination without abnormal findings: Secondary | ICD-10-CM | POA: Diagnosis not present

## 2022-02-05 DIAGNOSIS — I2510 Atherosclerotic heart disease of native coronary artery without angina pectoris: Secondary | ICD-10-CM | POA: Diagnosis not present

## 2022-02-05 DIAGNOSIS — Z853 Personal history of malignant neoplasm of breast: Secondary | ICD-10-CM | POA: Diagnosis not present

## 2022-02-05 DIAGNOSIS — M810 Age-related osteoporosis without current pathological fracture: Secondary | ICD-10-CM | POA: Diagnosis not present

## 2022-02-05 DIAGNOSIS — E559 Vitamin D deficiency, unspecified: Secondary | ICD-10-CM | POA: Diagnosis not present

## 2022-02-05 DIAGNOSIS — F322 Major depressive disorder, single episode, severe without psychotic features: Secondary | ICD-10-CM | POA: Diagnosis not present

## 2022-02-05 DIAGNOSIS — J449 Chronic obstructive pulmonary disease, unspecified: Secondary | ICD-10-CM | POA: Diagnosis not present

## 2022-02-05 DIAGNOSIS — Z136 Encounter for screening for cardiovascular disorders: Secondary | ICD-10-CM | POA: Diagnosis not present

## 2022-02-06 DIAGNOSIS — C50412 Malignant neoplasm of upper-outer quadrant of left female breast: Secondary | ICD-10-CM | POA: Diagnosis not present

## 2022-02-20 DIAGNOSIS — I2510 Atherosclerotic heart disease of native coronary artery without angina pectoris: Secondary | ICD-10-CM | POA: Diagnosis not present

## 2022-02-20 DIAGNOSIS — M25551 Pain in right hip: Secondary | ICD-10-CM | POA: Diagnosis not present

## 2022-02-20 DIAGNOSIS — M25552 Pain in left hip: Secondary | ICD-10-CM | POA: Diagnosis not present

## 2022-02-27 DIAGNOSIS — I2510 Atherosclerotic heart disease of native coronary artery without angina pectoris: Secondary | ICD-10-CM | POA: Diagnosis not present

## 2022-03-03 ENCOUNTER — Other Ambulatory Visit: Payer: Self-pay

## 2022-03-03 DIAGNOSIS — I2510 Atherosclerotic heart disease of native coronary artery without angina pectoris: Secondary | ICD-10-CM

## 2022-03-20 ENCOUNTER — Encounter: Payer: Self-pay | Admitting: Internal Medicine

## 2022-03-20 ENCOUNTER — Ambulatory Visit: Payer: Medicare PPO | Admitting: Internal Medicine

## 2022-03-20 VITALS — BP 126/76 | HR 80 | Temp 98.0°F | Ht 63.0 in | Wt 126.8 lb

## 2022-03-20 DIAGNOSIS — I2584 Coronary atherosclerosis due to calcified coronary lesion: Secondary | ICD-10-CM | POA: Diagnosis not present

## 2022-03-20 DIAGNOSIS — M25552 Pain in left hip: Secondary | ICD-10-CM | POA: Diagnosis not present

## 2022-03-20 DIAGNOSIS — J4530 Mild persistent asthma, uncomplicated: Secondary | ICD-10-CM

## 2022-03-20 DIAGNOSIS — Z87891 Personal history of nicotine dependence: Secondary | ICD-10-CM

## 2022-03-20 DIAGNOSIS — R918 Other nonspecific abnormal finding of lung field: Secondary | ICD-10-CM | POA: Diagnosis not present

## 2022-03-20 DIAGNOSIS — J3489 Other specified disorders of nose and nasal sinuses: Secondary | ICD-10-CM

## 2022-03-20 DIAGNOSIS — I2510 Atherosclerotic heart disease of native coronary artery without angina pectoris: Secondary | ICD-10-CM | POA: Diagnosis not present

## 2022-03-20 DIAGNOSIS — M25551 Pain in right hip: Secondary | ICD-10-CM | POA: Diagnosis not present

## 2022-03-20 DIAGNOSIS — M5136 Other intervertebral disc degeneration, lumbar region: Secondary | ICD-10-CM | POA: Diagnosis not present

## 2022-03-20 MED ORDER — FLUTICASONE PROPIONATE HFA 220 MCG/ACT IN AERO: 2.0000 | INHALATION_SPRAY | Freq: Two times a day (BID) | RESPIRATORY_TRACT | 5 refills | Status: DC

## 2022-03-20 MED ORDER — FLUTICASONE PROPIONATE 50 MCG/ACT NA SUSP: 1.0000 | Freq: Every day | NASAL | 3 refills | Status: DC

## 2022-03-20 MED ORDER — ALBUTEROL SULFATE HFA 108 (90 BASE) MCG/ACT IN AERS: 2.0000 | INHALATION_SPRAY | Freq: Four times a day (QID) | RESPIRATORY_TRACT | 6 refills | Status: DC | PRN

## 2022-03-20 NOTE — Patient Instructions (Addendum)
Asthma will controlled - mild persistent  Well controllled PFT normal 07/26/2021  Plan  - continue flovent as scheduled daily -Continue Flonase  - use albuterol as needed\  =-CMA to ensure refills   Lung nodule < 55m on CT in 2020 - no change April 2023 Prior smoker and passive smoking hisotry History of radon exposure Family history of lung cancer - cousin  -Stable less than 3 mm nodule on CT scan April 2023, compared to 2020 -   Followup  - No further CT scan of the chest given the fact you age over 89 Coronary artery calcification Aortic valve calcification  Plan - Glad that an echocardiogram and appointment with Dr. TSkeet Latchhas been scheduled  Follow-up - 9 months or sooner if needed

## 2022-03-20 NOTE — Addendum Note (Signed)
Addended by: Lorretta Harp on: 03/20/2022 10:34 AM   Modules accepted: Orders

## 2022-03-20 NOTE — Progress Notes (Signed)
IOV 09/04/2017  Chief Complaint  Patient presents with   Consult    Referred by Dr. Inda Merlin for cough.  Pt states she had an episode beginning 12/1 which lasted for two weeks and then in mid January 2019 had another episode with cough. Denies any current complaints of cough, or CP but has some mild SOB and has some mucus in chest/throat.  Pt is currently taking prednisone.   76 year old patient referred by Dr. Inda Merlin for evaluation of cough.  History is gained from talking to her and review of referral records.  Patient tells me that at baseline she never had any cough or shortness of breath but on deeper questioning she did admit to very mild dyspnea on exertion going on for some months or years.  But it was barely noticeable.  Then in early December 2000 and she did have an episode of bronchitis that resulted in cough lasting a few weeks.  This resolved.  Then in January 2019 she had another episode of bronchitis and the cough is still lingered as of this date.  She initially required a round of antibiotic and later prednisone which she says she did not take correctly.  Then in February another course of prednisone.  Currently on third course of prednisone for a few days and has several more days to go.  With this third course of prednisone she is beginning to feel better.  Cough was initially severe and associated with inability to lie flat and wheezing.  But with his third round of prednisone it is now resolved to mild and she is able to sleep in her bed shortness of breath is around the same there is no sputum although review of the records indicate that she did have some green sputum in the past.  She is not on any maintenance inhalers but has taken albuterol in the past with relief.  Exposure history: She lives in a 78 year old home and is lived in the same home for 30 years.  She has noted some mold in the windows.  In addition she uses a wood stove in the basement during the winter season  which is been doing for many years.  Also has a history of radon exposure in the same home when she got it tested over 10 years ago.  She is also been a smoker 80 pack smoking history having quit within the last 15 years or less.     OV 10/16/2017  Chief Complaint  Patient presents with   Follow-up    PFT done 4/12 and HRCT done 5/7.  Pt states when she was on prednisone, the cough and wheezing stopped. Around Easter, pt's cough came back bad. Pt states it is hard for her to cough mucus up but has it in her chest.    Follow-up chronic cough in the setting of radon exposure, prior 80 pack smoking history and more recent mold exposure.  At last visit when I saw her for the first time in April 2019 exam nitric oxide was slightly in the gray zone and in the setting of prednisone her cough is getting better.  She returns now after finishing the prednisone and completing work-up with high-resolution CT chest and pulmonary function test.  She had her pulmonary function test September 11, 2017 prior to Easter and this is normal and I personally visualized the image.  She did have high-resolution CT chest in May 2019 and there is no interstitial lung disease oror  cancer.  She has tiny 4 mm lung nodule and some really mild emphysema.  However she tells me that after she finished her prednisone around Easter 2019 her cough returned with a vengeance and she has had significant wheezing.  In fact her cough symptom score documented below shows severe amount of symptoms.  She has nocturnal cough.  So we retested exam nitric oxide today and is significantly elevated at 59 ppb   IMPRESSION: - personally visualized image and agree with findings 1. No definitive evidence to suggest interstitial lung disease. There are some areas of parenchymal banding in the lung bases bilaterally which are strongly favored to reflect areas of chronic post infectious or inflammatory scarring. 2. Mild diffuse bronchial thickening with  mild centrilobular and paraseptal emphysema; imaging findings suggestive of underlying COPD. 3. Multiple tiny 2-4 mm pulmonary nodules scattered throughout both lungs, nonspecific, but statistically likely benign. No follow-up needed if patient is low-risk (and has no known or suspected primary neoplasm). Non-contrast chest CT can be considered in 12 months if patient is high-risk. This recommendation follows the consensus statement: Guidelines for Management of Incidental Pulmonary Nodules Detected on CT Images: From the Fleischner Society 2017; Radiology 2017; 284:228-243. 4. Aortic atherosclerosis, in addition to 2 vessel coronary artery disease. Assessment for potential risk factor modification, dietary therapy or pharmacologic therapy may be warranted, if clinically indicated.   Aortic Atherosclerosis (ICD10-I70.0) and Emphysema (ICD10-J43.9).     Electronically Signed   By: Vinnie Langton M.D.   On: 10/06/2017 16:30      OV 11/18/2017  Chief Complaint  Patient presents with   Follow-up    6/15 Sinus started to drain yellow in color. Having chest congestion, wheezing coughing productive yellow mucus. She had some left over doxycline and has taken '50mg'$  qd for 8 days now.   Dominique Cobb , 76 y.o. , with dob [birth year] and female ,Not Hispanic or Latino from [address] [place] [address] - presents to lung clinic for chronic cough - cough variant asthma/midl radiologic emphsyema + lung nodule 59m with prior 80ppd smokhng hx  -Last visit I started her on Symbicort and given prednisone. After this the cough resolved essentially and the wheezing completely resolved. This happened to the point that she actually stopped taking her Symbicort and then she she developed acute sinus infection with yellow sinus drainage and change in voiceand ear blockage. She had some leftover doxycycline 50 mg tablets given earlier sometime back by her primary care physician assistant. She  started taking this at 50 mg once a day and is only some better. She now feels the cough is descended into her lungs and is having some wheezing. Exhaled nitric oxide today is normal but she feels she'll benefit from prednisone. RSI cough score deteriorated and is documented below.    OV 01/22/2018  Chief Complaint  Patient presents with   Follow-up    Pt states she has been doing good since last visit and denies any complaints or concerns.   For cough. Asthma with nodule and emphysema  She is doing really well. Exhaled nitric oxide is 10 ppb. RSI cough score is minimally symptomatic. The main issues that she cut her right hand with glass and she's lost significant function. She has 28 stitches. She is going to start occupational therapy. SHe is open to reducing her be escalating Symbicort 2 inhaled steroid alone.   Feno 09/04/2017 - 29 ppb (on prednsione) ->  Repeat FeNO 10/16/2017 ->  59  OV 06/10/2018  Subjective:  Patient ID: Dominique Cobb, female , DOB: 04-19-1946 , age 23 y.o. , MRN: [id number] , ADDRESS: [address] [place] [address]   06/10/2018 -   Chief Complaint  Patient presents with   Follow-up    Pt states she had been doing good until the first of January 2020. States she has a cough with yellow mucus and clear sinus drainage. Pt states she does have some mild chest tightness.     HPI LOREL Cobb 76 y.o. -here on an acute visit.  She has asthma with emphysema and a lung nodule  She tells me that the house she lives [place] on [place] road and has timber dust and she is constantly being exposed to it but in reality for the holiday she went to [place] and after the new year 2020 she developed respiratory infection with sinus congestion that is distended down to her chest and is causing coughing, wheezing and no sputum.  She is only somewhat better but she still has a wheeze.  She continues to be on Pulmicort.  She says that compared to previous times she is still able  to sleep because of pulmicort   07/20/2020  76 year old female, former smoker quit in 2006 (80 pack year hx). PMH significant for asthma, pulmonary nodules, emphysema. Patient of Dr. Chase Caller, last seen in office on 06/10/18. Maintained on Pulmicort 130mg, albuterol hfa and flonase nasal spray.    Patient presents today for overdue follow-up for asthma. She is needing inhaler medication refills. She is complaint with pulmicort inhaler. If she misses Pulmicort dose she does notice a little more wheezing in the morning. She has not require Ventolin rescue inhaler. She had a wood stove at home but this has been removed. CT chest in June 2020 showed biapical pleural-parenchymal scarring, mild centrilobular and paraseptal emphysema and small subpleural nodules bilaterally measuring 2-460mand considered benign.    Pulmonary testing: PFTs- FVC 2.78 (106%), FEV1 2.14 (109%), ratio 77, DLCOunc 19.10 (88%), no BD response  Feno 09/04/2017 - 29 ppb (on prednsione) ->  Repeat FeNO 10/16/2017 ->  59   Imaging: CT chest June 2020- small subpleural nodules bilaterally measuring 2-10m10monsidered benign, no dedicated  follow-up necessary.      OV 07/26/2021  Subjective:  Patient ID: DiaOtelia Limesemale , DOB: [birth year]age 56 36o. , MRN: [id number]: [address] [id number] GatJosetta HuddleD Patient Care Team: GatJosetta HuddleD as PCP - General (Internal Medicine) EdwBrien MatesN as Oncology Nurse Navigator (Oncology)  This Provider for this visit: Treatment Team:  Attending Provider: RamBrand MalesD    07/26/2021 -   Chief Complaint  Patient presents with   Follow-up    PFT performed today.  Pt states she has been doing okay since last visit and denies any complaints.     HPI DiaKAALIYAH KITA 48o. -last seen by myself in the beginning of the pandemic approximately 3 years ago.  Seen by nurse practitioner 1 year ago.  In the interim in the summer 2022  she had outpatient COVID.  She had GI symptoms.  She took antiviral she got better.  Husband also had COVID.  But she is really here for asthma follow-up.  She says she is doing well.  She no longer is on Pulmicort because of insurance issues with coverage.  She is now on Flovent which is costing her $40  per month co-pay.  She is waiting for Meda Coffee to come up with his pharmacy so she can get better pricing.  In the interim she has not had any acute exacerbations.  No emergency room visits.  No urgent care visits no albuterol rescue usage no nocturnal awakenings.  ACT score shows really good control.  Of note she is 25.  She used to smoke in the past and she quit 17 years ago.  She today told me that she did have radon exposure in the house for 15 years but she fixed this 20 years ago.  In the last CT scan 2 years ago have some small micronodules.  We discussed about following this.  She is open to the idea.  She is worried about lung cancer risk.  This is particularly because one of her cousins is actively dying from lung cancer and was a non-smoker. Asthma Control Test ACT Total Score  07/26/2021 25  07/20/2020 21     Lab Results  Component Value Date   NITRICOXIDE 10 01/22/2018     OV 03/20/2022  Subjective:  Patient ID: Dominique Cobb, female , DOB: 1945/07/09 , age 30 y.o. , MRN: [id number] , ADDRESS: [address] [place] [address] PCP Josetta Huddle, MD Patient Care Team: Josetta Huddle, MD as PCP - General (Internal Medicine) Brien Mates, RN as Oncology Nurse Navigator (Oncology)  This Provider for this visit: Treatment Team:  Attending Provider: Brand Males, MD  Asthma follow-up History of outpatient mild COVID summer 2022 History of Bochdalek hernia Multiple small lung nodules stable History of 80 pack smoking history Clinical emphysema on CT scan April 2023 but DLCO on PFT normal February 2023. History of radon exposure  03/20/2022 -   Chief Complaint   Patient presents with   Follow-up    Discuss CT scan from April 2023. Scheduled in Nov 2023 for cardiologist. No sx noted today.     HPI Dominique Cobb 76 y.o. -returns for follow-up.  Her asthma is well controlled.  She is on Flovent.  She denies any shortness of breath.  No cough no wheezing no paroxysmal nocturnal dyspnea and no orthopnea.  No chest pain.  She feels good but she says she is feeling that she is falling apart.  She has recent back pain she seen Dr. Hazle Quant.  She is also concerned about coronary artery calcifications and arctic valve calcification.  Review of the record and talking to indicate that an echo has been set up and also follow-up with Dr. Skeet Latch.  Her paternal grandfather and an uncle had coronary artery disease.  But she does not.  And her dad and mom did not have coronary artery disease.  Her mom had an accidental death.  Nevertheless she is going to see cardiology which I think is a good idea.  For her nodules she did have CT scan April 2023 and these nodules are stable    Asthma Control Panel 06/10/2018   Current Med Regimen   ACQ 5 point- 1 week. wtd avg score. <1.0 is good control 0.75-1.25 is grey zone. >1.25 poor control. Delta 0.5 is clinically meaningful 1.4  FeNO ppB 8  FeV1    Planned intervention  for visit     CT Chest data APril 2023  Narrative & Impression  CLINICAL DATA:  76 year old female with history of pulmonary nodules. Follow-up study.   EXAM: CT CHEST WITHOUT CONTRAST   TECHNIQUE: Multidetector CT imaging of the chest was  performed following the standard protocol without IV contrast.   RADIATION DOSE REDUCTION: This exam was performed according to the departmental dose-optimization program which includes automated exposure control, adjustment of the mA and/or kV according to patient size and/or use of iterative reconstruction technique.   COMPARISON:  Chest CT 11/09/2018.   FINDINGS: Cardiovascular: Heart size is  normal. There is no significant pericardial fluid, thickening or pericardial calcification. There is aortic atherosclerosis, as well as atherosclerosis of the great vessels of the mediastinum and the coronary arteries, including calcified atherosclerotic plaque in the left main, left anterior descending, left circumflex and right coronary arteries. Calcifications of the aortic valve.   Mediastinum/Nodes: No pathologically enlarged mediastinal or hilar lymph nodes. Please note that accurate exclusion of hilar adenopathy is limited on noncontrast CT scans. Esophagus is unremarkable in appearance. No axillary lymphadenopathy.   Lungs/Pleura: Multiple tiny 2-3 mm pulmonary nodules are again noted scattered throughout the lungs bilaterally, stable in number and size compared to the prior study. No other larger more suspicious appearing pulmonary nodules or masses are noted. No acute consolidative airspace disease. No pleural effusions. Small right-sided Bochdalek's hernia incidentally noted. Mild diffuse bronchial wall thickening with very mild centrilobular and paraseptal emphysema.   Upper Abdomen: Aortic atherosclerosis.   Musculoskeletal: There are no aggressive appearing lytic or blastic lesions noted in the visualized portions of the skeleton. Orthopedic fixation hardware in the lower cervical spine incompletely imaged.   IMPRESSION: 1. Small 2-3 mm pulmonary nodules scattered throughout both lungs, stable compared to the prior study considered definitively benign requiring no future imaging follow-up. 2. Aortic atherosclerosis, in addition to left main and three-vessel coronary artery disease. Assessment for potential risk factor modification, dietary therapy or pharmacologic therapy may be warranted, if clinically indicated. 3. Mild diffuse bronchial wall thickening with very mild centrilobular and paraseptal emphysema; imaging findings suggestive of underlying COPD. 4.  There are calcifications of the aortic valve. Echocardiographic correlation for evaluation of potential valvular dysfunction may be warranted if clinically indicated.   Aortic Atherosclerosis (ICD10-I70.0) and Emphysema (ICD10-J43.9).     Electronically Signed   By: Vinnie Langton M.D.   On: 09/03/2021 08:22      No results found.    PFT     Latest Ref Rng & Units 07/26/2021    1:55 PM 09/11/2017    2:45 PM  PFT Results  FVC-Pre L 2.21  2.81   FVC-Predicted Pre % 88  107   FVC-Post L 2.34  2.78   FVC-Predicted Post % 93  106   Pre FEV1/FVC % % 75  74   Post FEV1/FCV % % 75  77   FEV1-Pre L 1.65  2.09   FEV1-Predicted Pre % 89  107   FEV1-Post L 1.76  2.14   DLCO uncorrected ml/min/mmHg 17.60  19.10   DLCO UNC% % 99  88   DLCO corrected ml/min/mmHg 17.60    DLCO COR %Predicted % 99    DLVA Predicted % 105  90   TLC L 4.85  5.09   TLC % Predicted % 101  107   RV % Predicted % 111  85        has a past medical history of Anxiety, Aortic atherosclerosis ([place]), Arthritis, Asthma, Depression, Dysphagia, Dyspnea, Family history of adverse reaction to anesthesia, and GERD (gastroesophageal reflux disease).   reports that she quit smoking about 17 years ago. Her smoking use included cigarettes. She has a 80.00 pack-year smoking history. She has never used  smokeless tobacco.  Past Surgical History:  Procedure Laterality Date   BREAST CYST ASPIRATION Left    BREAST EXCISIONAL BIOPSY Left    BREAST LUMPECTOMY WITH RADIOACTIVE SEED AND SENTINEL LYMPH NODE BIOPSY Left 03/08/2020   Procedure: LEFT BREAST LUMPECTOMY WITH RADIOACTIVE SEED AND LEFT AXILLARY SENTINEL LYMPH NODE BIOPSY;  Surgeon: Rolm Bookbinder, MD;  Location: [facility];  Service: General;  Laterality: Left;  PEC BLOCK   BREAST SURGERY     benign.  25 yrs ago   [facility]  1980's   bilateral    CATARACT EXTRACTION W/PHACO Left 06/12/2016   Procedure: CATARACT EXTRACTION PHACO AND INTRAOCULAR LENS  PLACEMENT LEFT EYE EPP2.95;  Surgeon: Tonny Branch, MD;  Location: [facility];  Service: Ophthalmology;  Laterality: Left;  left   CATARACT EXTRACTION W/PHACO Right 07/24/2016   Procedure: CATARACT EXTRACTION PHACO AND INTRAOCULAR LENS PLACEMENT (IOC);  Surgeon: Tonny Branch, MD;  Location: [facility];  Service: Ophthalmology;  Laterality: Right;  right cde 8.29   COLONOSCOPY N/A 10/29/2012   Procedure: COLONOSCOPY;  Surgeon: Rogene Houston, MD;  [facility];  Service: Endoscopy;  Laterality: N/A;  140   Dental implants     FINGER SURGERY     trigger finger 3rd  bilaterally   laproscopy     over 20 yrs ago   NECK SURGERY     2007   TONSILLECTOMY      Allergies  Allergen Reactions   Codeine Other (See Comments)    Sweating and Patient just doesn't like.     Immunization History  Administered Date(s) Administered   Influenza Split 02/07/2020   Influenza, High Dose Seasonal PF 03/02/2017, 03/04/2018, 02/01/2019, 02/19/2021   Influenza-Unspecified 02/03/2019, 02/19/2022   Moderna Sars-Covid-2 Vaccination 04/04/2020, 10/16/2020   PFIZER(Purple Top)SARS-COV-2 Vaccination 07/20/2019, 08/10/2019   Pneumococcal Conjugate,unspecified 02/19/2022   Zoster Recombinat (Shingrix) 05/17/2018    Family History  Problem Relation Age of Onset   Alcoholism Mother    Depression Mother    Arthritis Father    Thyroid disease Father    Heart attack Paternal Uncle    Hypertension Maternal Grandmother    Dementia Maternal Grandmother    Heart attack Maternal Grandfather    Arthritis Maternal Grandfather    Hip fracture Paternal Grandmother      Current Outpatient Medications:    FLOVENT HFA 220 MCG/ACT inhaler, INHALE TWO PUFFS BY MOUTH EVERY DAY, Disp: 12 g, Rfl: 5   Multiple Minerals-Vitamins (CALCIUM-MAGNESIUM-ZINC-D3) TABS, , Disp: , Rfl:    rosuvastatin (CRESTOR) 5 MG tablet, Take 5 mg by mouth 3 (three) times a week., Disp: , Rfl:    sertraline (ZOLOFT) 100 MG tablet, Take 1.5 tablets  by mouth daily., Disp: , Rfl:    zolpidem (AMBIEN) 5 MG tablet, Take 5 mg by mouth at bedtime as needed for sleep. , Disp: , Rfl:    albuterol (VENTOLIN HFA) 108 (90 Base) MCG/ACT inhaler, Inhale 2 puffs into the lungs every 6 (six) hours as needed for wheezing or shortness of breath., Disp: 8 g, Rfl: 6   benzonatate (TESSALON) 200 MG capsule, Take 1 capsule (200 mg total) by mouth 3 (three) times daily as needed for cough., Disp: 20 capsule, Rfl: 0   fluticasone (FLONASE) 50 MCG/ACT nasal spray, Place 1 spray into both nostrils daily., Disp: 16 g, Rfl: 3   ibuprofen (ADVIL) 400 MG tablet, Take 400 mg by mouth daily as needed for moderate pain.  (Patient not taking: Reported on 03/20/2022), Disp: ,  Rfl:    tamoxifen (NOLVADEX) 20 MG tablet, TAKE 1 TABLET BY MOUTH EVERY DAY, Disp: 90 tablet, Rfl: 3      Objective:   Vitals:   03/20/22 1018  BP: 126/76  Pulse: 80  Temp: 98 F (36.7 C)  TempSrc: Oral  SpO2: 95%  Weight: 126 lb 12.8 oz (57.5 kg)  Height: '5\' 3"'$  (1.6 m)    Estimated body mass index is 22.46 kg/m as calculated from the following:   Height as of this encounter: '5\' 3"'$  (1.6 m).   Weight as of this encounter: 126 lb 12.8 oz (57.5 kg).  '@WEIGHTCHANGE'$ @  Filed Weights   03/20/22 1018  Weight: 126 lb 12.8 oz (57.5 kg)     Physical Exam    General: No distress. Look swell Neuro: Alert and Oriented x 3. GCS 15. Speech normal Psych: Pleasant Resp:  Barrel Chest - no.  Wheeze - no, Crackles - no, No overt respiratory distress CVS: Normal heart sounds. Murmurs - no Ext: Stigmata of Connective Tissue Disease - no HEENT: Normal upper airway. PEERL +. No post nasal drip        Assessment:       ICD-10-CM   1. Mild persistent asthma without complication  J44.92     2. Sinus drainage  J34.89 fluticasone (FLONASE) 50 MCG/ACT nasal spray    3. Former smoker  Z87.891     4. Coronary artery calcification  I25.10    I25.84     5. Pulmonary nodules  R91.8           Plan:     Patient Instructions  Asthma will controlled - mild persistent  Well controllled PFT normal 07/26/2021  Plan  - continue flovent as scheduled daily -Continue Flonase  - use albuterol as needed\  =-CMA to ensure refills   Lung nodule < 72m on CT in 2020 - no change April 2023 Prior smoker and passive smoking hisotry History of radon exposure Family history of lung cancer - cousin  -Stable less than 3 mm nodule on CT scan April 2023, compared to 2020 -   Followup  - No further CT scan of the chest given the fact you age over 89 Coronary artery calcification Aortic valve calcification  Plan - Glad that an echocardiogram and appointment with Dr. TSkeet Latchhas been scheduled  Follow-up - 9 months or sooner if needed    SIGNATURE    Dr. MBrand Males M.D., F.C.C.P,  Pulmonary and Critical Care Medicine Staff Physician, CPalisadeDirector - Interstitial Lung Disease  Program  Pulmonary [place] [address] Pager: [phone] If no answer or between  15:00h - 7:00h: call [phone] Telephone: [phone]  10:32 AM 03/20/2022

## 2022-03-21 DIAGNOSIS — M7552 Bursitis of left shoulder: Secondary | ICD-10-CM | POA: Diagnosis not present

## 2022-03-21 DIAGNOSIS — M7532 Calcific tendinitis of left shoulder: Secondary | ICD-10-CM | POA: Diagnosis not present

## 2022-03-25 ENCOUNTER — Ambulatory Visit (HOSPITAL_COMMUNITY): Payer: Medicare PPO | Attending: Internal Medicine

## 2022-03-25 DIAGNOSIS — I2510 Atherosclerotic heart disease of native coronary artery without angina pectoris: Secondary | ICD-10-CM

## 2022-03-25 LAB — ECHOCARDIOGRAM COMPLETE
Area-P 1/2: 2.73 cm2
S' Lateral: 2.8 cm

## 2022-03-27 DIAGNOSIS — M5136 Other intervertebral disc degeneration, lumbar region: Secondary | ICD-10-CM | POA: Diagnosis not present

## 2022-03-27 DIAGNOSIS — M545 Low back pain, unspecified: Secondary | ICD-10-CM | POA: Diagnosis not present

## 2022-03-27 DIAGNOSIS — M7552 Bursitis of left shoulder: Secondary | ICD-10-CM | POA: Diagnosis not present

## 2022-04-01 DIAGNOSIS — M545 Low back pain, unspecified: Secondary | ICD-10-CM | POA: Diagnosis not present

## 2022-04-01 DIAGNOSIS — M5136 Other intervertebral disc degeneration, lumbar region: Secondary | ICD-10-CM | POA: Diagnosis not present

## 2022-04-01 DIAGNOSIS — M7552 Bursitis of left shoulder: Secondary | ICD-10-CM | POA: Diagnosis not present

## 2022-04-03 DIAGNOSIS — M5136 Other intervertebral disc degeneration, lumbar region: Secondary | ICD-10-CM | POA: Diagnosis not present

## 2022-04-03 DIAGNOSIS — M545 Low back pain, unspecified: Secondary | ICD-10-CM | POA: Diagnosis not present

## 2022-04-03 DIAGNOSIS — M7552 Bursitis of left shoulder: Secondary | ICD-10-CM | POA: Diagnosis not present

## 2022-04-08 DIAGNOSIS — M545 Low back pain, unspecified: Secondary | ICD-10-CM | POA: Diagnosis not present

## 2022-04-08 DIAGNOSIS — M5136 Other intervertebral disc degeneration, lumbar region: Secondary | ICD-10-CM | POA: Diagnosis not present

## 2022-04-08 DIAGNOSIS — M7552 Bursitis of left shoulder: Secondary | ICD-10-CM | POA: Diagnosis not present

## 2022-04-15 DIAGNOSIS — M7552 Bursitis of left shoulder: Secondary | ICD-10-CM | POA: Diagnosis not present

## 2022-04-15 DIAGNOSIS — M5136 Other intervertebral disc degeneration, lumbar region: Secondary | ICD-10-CM | POA: Diagnosis not present

## 2022-04-15 DIAGNOSIS — M545 Low back pain, unspecified: Secondary | ICD-10-CM | POA: Diagnosis not present

## 2022-04-17 DIAGNOSIS — M545 Low back pain, unspecified: Secondary | ICD-10-CM | POA: Diagnosis not present

## 2022-04-17 DIAGNOSIS — M5136 Other intervertebral disc degeneration, lumbar region: Secondary | ICD-10-CM | POA: Diagnosis not present

## 2022-04-17 DIAGNOSIS — M7552 Bursitis of left shoulder: Secondary | ICD-10-CM | POA: Diagnosis not present

## 2022-04-21 DIAGNOSIS — M545 Low back pain, unspecified: Secondary | ICD-10-CM | POA: Diagnosis not present

## 2022-04-21 DIAGNOSIS — M7552 Bursitis of left shoulder: Secondary | ICD-10-CM | POA: Diagnosis not present

## 2022-04-21 DIAGNOSIS — M5136 Other intervertebral disc degeneration, lumbar region: Secondary | ICD-10-CM | POA: Diagnosis not present

## 2022-04-28 ENCOUNTER — Encounter (HOSPITAL_BASED_OUTPATIENT_CLINIC_OR_DEPARTMENT_OTHER): Payer: Self-pay | Admitting: Cardiovascular Disease

## 2022-04-28 ENCOUNTER — Ambulatory Visit (HOSPITAL_BASED_OUTPATIENT_CLINIC_OR_DEPARTMENT_OTHER): Payer: Medicare PPO | Admitting: Cardiovascular Disease

## 2022-04-28 VITALS — BP 136/76 | HR 71 | Ht 63.0 in | Wt 125.9 lb

## 2022-04-28 DIAGNOSIS — R0609 Other forms of dyspnea: Secondary | ICD-10-CM | POA: Diagnosis not present

## 2022-04-28 DIAGNOSIS — E7800 Pure hypercholesterolemia, unspecified: Secondary | ICD-10-CM

## 2022-04-28 DIAGNOSIS — I2510 Atherosclerotic heart disease of native coronary artery without angina pectoris: Secondary | ICD-10-CM | POA: Diagnosis not present

## 2022-04-28 HISTORY — DX: Atherosclerotic heart disease of native coronary artery without angina pectoris: I25.10

## 2022-04-28 HISTORY — DX: Pure hypercholesterolemia, unspecified: E78.00

## 2022-04-28 NOTE — Assessment & Plan Note (Signed)
LDL goal is <70.  Rosuvastatin was increased to daily.  She will come back for fasting lipids/CMP.

## 2022-04-28 NOTE — Assessment & Plan Note (Signed)
Three vessel CAD noted on chest CT including LM.  She doesn't have any exertional CP or SOB.  Recommend getting a Lexiscan Myoview to assess for ischemia.  Agree with aspirin '81mg'$  daily and rosuvastatin.  Repeat lipids/CMP now that she increased it to daily.  LDL goal is <70.

## 2022-04-28 NOTE — Patient Instructions (Signed)
Medication Instructions:  Your physician recommends that you continue on your current medications as directed. Please refer to the Current Medication list given to you today.   *If you need a refill on your cardiac medications before your next appointment, please call your pharmacy*  Lab Work: FASTING LP/CMET SOON   If you have labs (blood work) drawn today and your tests are completely normal, you will receive your results only by: [place] (if you have MyChart) OR A paper copy in the mail If you have any lab test that is abnormal or we need to change your treatment, we will call you to review the results.  Testing/Procedures: Your physician has requested that you have en exercise stress myoview. For further information please visit [email or web address]. Please follow instruction sheet, as given.  Follow-Up: At [facility], you and your health needs are our priority.  As part of our continuing mission to provide you with exceptional heart care, we have created designated Provider Care Teams.  These Care Teams include your primary Cardiologist (physician) and Advanced Practice Providers (APPs -  Physician Assistants and Nurse Practitioners) who all work together to provide you with the care you need, when you need it.  We recommend signing up for the patient portal called "MyChart".  Sign up information is provided on this After Visit Summary.  MyChart is used to connect with patients for Virtual Visits (Telemedicine).  Patients are able to view lab/test results, encounter notes, upcoming appointments, etc.  Non-urgent messages can be sent to your provider as well.   To learn more about what you can do with MyChart, go to [email or web address].    Your next appointment:   12 month(s)  The format for your next appointment:   In Person  Provider:   Skeet Latch, MD

## 2022-04-28 NOTE — Progress Notes (Signed)
Cardiology Office Note:    Date:  04/28/2022   ID:  Dominique Cobb, DOB 11/09/1945, MRN [id number]  PCP:  Josetta Huddle, [place] Prov[facility] Cardiologist:  Skeet Latch, MD     Referring MD: Charlane Ferretti, MD   No chief complaint on file.   History of Present Illness:    Dominique Cobb is a 76 y.o. female with CAD on CT, hyperlipidemia, prior tobacco use, prior beast cancer who is being seen today for the evaluation of coronary calcification seen on CT at the request of Charlane Ferretti, MD.  She was found to have coronary calcification on chest CT. Rosuvastatin was increased and was referred to cardiology. Her LDL was 79. She had an echo on 03/21/22 that showed LVF 08-65%, grade-I diastolic dysfunction.  She has been doing well overall. She stated that she has some mild LE edema but has been using compression socks. The LE edema is more common to occur on her right leg. She denied ever having serious injury on her legs. She finds that elevating her legs relieve the swelling. She noted that she has bursitis and tendonitis on her left arm and been experiencing back pain, which she is doing PT for. The physical therapy is consisted of using bands and doing core or leg workouts. Besides PT, she goes on walks for her physical activity. Her paternal grandfather had heart disease at his late 74s, which he passed away from. One of her father's brothers passed away from a heart attack. She believes that her paternal grandmother had a cardiac history, but wasn't serious. She reports that she smoked in the passed but quit in 2006.   Past Medical History:  Diagnosis Date   Anxiety    Aortic atherosclerosis ([place])    Arthritis    Asthma    CAD in native artery 04/28/2022   Depression    Dysphagia    Dyspnea    on exertion   Family history of adverse reaction to anesthesia    father's lung collapsed during lumbar surgery   GERD (gastroesophageal reflux disease)    Pure  hypercholesterolemia 04/28/2022    Past Surgical History:  Procedure Laterality Date   BREAST CYST ASPIRATION Left    BREAST EXCISIONAL BIOPSY Left    BREAST LUMPECTOMY WITH RADIOACTIVE SEED AND SENTINEL LYMPH NODE BIOPSY Left 03/08/2020   Procedure: LEFT BREAST LUMPECTOMY WITH RADIOACTIVE SEED AND LEFT AXILLARY SENTINEL LYMPH NODE BIOPSY;  Surgeon: Rolm Bookbinder, MD;  Location: [facility] [place];  Service: General;  Laterality: Left;  PEC BLOCK   BREAST SURGERY     benign.  25 yrs ago   [place]  1980's   bilateral    CATARACT EXTRACTION W/PHACO Left 06/12/2016   Procedure: CATARACT EXTRACTION PHACO AND INTRAOCULAR LENS PLACEMENT LEFT EYE HQI6.96;  Surgeon: Tonny Branch, MD;  Location: [facility];  Service: Ophthalmology;  Laterality: Left;  left   CATARACT EXTRACTION W/PHACO Right 07/24/2016   Procedure: CATARACT EXTRACTION PHACO AND INTRAOCULAR LENS PLACEMENT (IOC);  Surgeon: Tonny Branch, MD;  Location: [facility];  Service: Ophthalmology;  Laterality: Right;  right cde 8.29   COLONOSCOPY N/A 10/29/2012   Procedure: COLONOSCOPY;  Surgeon: Rogene Houston, MD;  Location: [facility];  Service: Endoscopy;  Laterality: N/A;  140   Dental implants     FINGER SURGERY     trigger finger 3rd  bilaterally   laproscopy     over 20 yrs ago  NECK SURGERY     2007   TONSILLECTOMY      Current Medications: Current Meds  Medication Sig   albuterol (VENTOLIN HFA) 108 (90 Base) MCG/ACT inhaler Inhale 2 puffs into the lungs every 6 (six) hours as needed for wheezing or shortness of breath.   aspirin EC 81 MG tablet Take 81 mg by mouth daily. Swallow whole.   Cholecalciferol (VITAMIN D3) 50 MCG (2000 UT) capsule Take 2,000 Units by mouth daily.   fluticasone (FLONASE) 50 MCG/ACT nasal spray Place 1 spray into both nostrils daily.   fluticasone (FLOVENT HFA) 220 MCG/ACT inhaler Inhale 2 puffs into the lungs in the morning and at bedtime.   rosuvastatin (CRESTOR) 5 MG tablet Take 5 mg by mouth daily.    sertraline (ZOLOFT) 100 MG tablet Take 1.5 tablets by mouth daily.   zolpidem (AMBIEN) 5 MG tablet Take 5 mg by mouth at bedtime as needed for sleep.      Allergies:   Codeine   Social History   Socioeconomic History   Marital status: Divorced    Spouse name: Not on file   Number of children: Not on file   Years of education: Not on file   Highest education level: Not on file  Occupational History   Occupation: retired  Tobacco Use   Smoking status: Former    Packs/day: 2.00    Years: 40.00    Total pack years: 80.00    Types: Cigarettes    Quit date: 09/04/2004    Years since quitting: 17.6   Smokeless tobacco: Never  Vaping Use   Vaping Use: Never used  Substance and Sexual Activity   Alcohol use: Yes    Comment: occasionally+   Drug use: No   Sexual activity: Yes    Birth control/protection: None  Other Topics Concern   Not on file  Social History Narrative   Not on file   Social Determinants of Health   Financial Resource Strain: Not on file  Food Insecurity: No Food Insecurity (04/28/2022)   Hunger Vital Sign    Worried About Running Out of Food in the Last Year: Never true    [place] in the Last Year: Never true  Transportation Needs: No Transportation Needs (04/28/2022)   PRAPARE - Hydrologist (Medical): No    Lack of Transportation (Non-Medical): No  Physical Activity: Insufficiently Active (04/28/2022)   Exercise Vital Sign    Days of Exercise per Week: 4 days    Minutes of Exercise per Session: 30 min  Stress: Not on file  Social Connections: Not on file     Family History: The patient's family history includes Alcoholism in her mother; Arthritis in her father and maternal grandfather; Dementia in her maternal grandmother; Depression in her mother; Heart attack in her maternal uncle and paternal uncle; Heart disease in her paternal grandfather; Hip fracture in her paternal grandmother; Hypertension in her  maternal grandmother; Thyroid disease in her father.  ROS:   Please see the history of present illness.     All other systems reviewed and are negative.  EKGs/Labs/Other Studies Reviewed:    The following studies were reviewed today:  Echo 03/25/2022: IMPRESSIONS   1. Left ventricular ejection fraction, by estimation, is 60 to 65%. Left  ventricular ejection fraction by 3D volume is 64 %. The left ventricle has  normal function. The left ventricle has no regional wall motion  abnormalities. Left ventricular diastolic  parameters are consistent with Grade I diastolic dysfunction (impaired  relaxation). The average left ventricular global longitudinal strain is  -21.1 %. The global longitudinal strain is normal.   2. Right ventricular systolic function is normal. The right ventricular  size is normal. There is normal pulmonary artery systolic pressure.   3. The mitral valve is normal in structure. No evidence of mitral valve  regurgitation. No evidence of mitral stenosis.   4. The aortic valve is tricuspid. Aortic valve regurgitation is not  visualized. No aortic stenosis is present.   5. The inferior vena cava is normal in size with greater than 50%  respiratory variability, suggesting right atrial pressure of 3 mmHg.   Chest X-Ray 10/15/2021: FINDINGS: Normal cardiac silhouette. Hyperinflated lungs. No effusion, infiltrate or pneumothorax.   IMPRESSION: Hyperinflated lungs.  No acute findings.  EKG:  04/28/2022: Sinus rhythm. Rate 71 bpm.  Recent Labs: No results found for requested labs within last 365 days.  Recent Lipid Panel No results found for: "CHOL", "TRIG", "HDL", "CHOLHDL", "VLDL", "LDLCALC", "LDLDIRECT"   Risk Assessment/Calculations:                Physical Exam:    VS:  BP 136/76 (BP Location: Right Arm, Patient Position: Sitting, Cuff Size: Normal)   Pulse 71   Ht '5\' 3"'$  (1.6 m)   Wt 125 lb 14.4 oz (57.1 kg)   BMI 22.30 kg/m  , BMI Body mass  index is 22.3 kg/m. GENERAL:  Well appearing HEENT: Pupils equal round and reactive, fundi not visualized, oral mucosa unremarkable NECK:  No jugular venous distention, waveform within normal limits, carotid upstroke brisk and symmetric, no bruits, no thyromegaly LUNGS:  Clear to auscultation bilaterally HEART:  RRR.  PMI not displaced or sustained,S1 and S2 within normal limits, no S3, no S4, no clicks, no rubs, no murmurs ABD:  Flat, positive bowel sounds normal in frequency in pitch, no bruits, no rebound, no guarding, no midline pulsatile mass, no hepatomegaly, no splenomegaly EXT:  2 plus pulses throughout, no edema, no cyanosis no clubbing SKIN:  No rashes no nodules NEURO:  Cranial nerves II through XII grossly intact, motor grossly intact throughout PSYCH:  Cognitively intact, oriented to person place and time   ASSESSMENT:    1. CAD in native artery   2. Pure hypercholesterolemia   3. DOE (dyspnea on exertion)    PLAN:    Pure hypercholesterolemia LDL goal is <70.  Rosuvastatin was increased to daily.  She will come back for fasting lipids/CMP.    CAD in native artery Three vessel CAD noted on chest CT including LM.  She doesn't have any exertional CP or SOB.  Recommend getting a Lexiscan Myoview to assess for ischemia.  Agree with aspirin '81mg'$  daily and rosuvastatin.  Repeat lipids/CMP now that she increased it to daily.  LDL goal is <70.     Shared Decision Making/Informed Consent The risks [chest pain, shortness of breath, cardiac arrhythmias, dizziness, blood pressure fluctuations, myocardial infarction, stroke/transient ischemic attack, nausea, vomiting, allergic reaction, radiation exposure, metallic taste sensation and life-threatening complications (estimated to be 1 in 10,000)], benefits (risk stratification, diagnosing coronary artery disease, treatment guidance) and alternatives of a nuclear stress test were discussed in detail with Dominique Cobb and she agrees to  proceed.    Medication Adjustments/Labs and Tests Ordered: Current medicines are reviewed at length with the patient today.  Concerns regarding medicines are outlined above.  Orders Placed This Encounter  Procedures   Lipid  panel   Comprehensive metabolic panel   MYOCARDIAL PERFUSION IMAGING   EKG 12-Lead   No orders of the defined types were placed in this encounter.   Patient Instructions  Medication Instructions:  Your physician recommends that you continue on your current medications as directed. Please refer to the Current Medication list given to you today.   *If you need a refill on your cardiac medications before your next appointment, please call your pharmacy*  Lab Work: FASTING LP/CMET SOON   If you have labs (blood work) drawn today and your tests are completely normal, you will receive your results only by: [place] (if you have MyChart) OR A paper copy in the mail If you have any lab test that is abnormal or we need to change your treatment, we will call you to review the results.  Testing/Procedures: Your physician has requested that you have en exercise stress myoview. For further information please visit [email or web address]. Please follow instruction sheet, as given.  Follow-Up: At [facility], you and your health needs are our priority.  As part of our continuing mission to provide you with exceptional heart care, we have created designated Provider Care Teams.  These Care Teams include your primary Cardiologist (physician) and Advanced Practice Providers (APPs -  Physician Assistants and Nurse Practitioners) who all work together to provide you with the care you need, when you need it.  We recommend signing up for the patient portal called "MyChart".  Sign up information is provided on this After Visit Summary.  MyChart is used to connect with patients for Virtual Visits (Telemedicine).  Patients are able to view lab/test results, encounter  notes, upcoming appointments, etc.  Non-urgent messages can be sent to your provider as well.   To learn more about what you can do with MyChart, go to [email or web address].    Your next appointment:   12 month(s)  The format for your next appointment:   In Person  Provider:   Skeet Latch, MD         Ericka Pontiff as a scribe for Skeet Latch, MD.,have documented all relevant documentation on the behalf of Skeet Latch, MD,as directed by  Skeet Latch, MD while in the presence of Skeet Latch, MD.  I, Bronson Oval Linsey, MD have reviewed all documentation for this visit.  The documentation of the exam, diagnosis, procedures, and orders on 04/28/2022 are all accurate and complete.   Signed, Skeet Latch, MD  04/28/2022 5:51 PM    [place]

## 2022-04-29 DIAGNOSIS — M5136 Other intervertebral disc degeneration, lumbar region: Secondary | ICD-10-CM | POA: Diagnosis not present

## 2022-04-29 DIAGNOSIS — M545 Low back pain, unspecified: Secondary | ICD-10-CM | POA: Diagnosis not present

## 2022-04-29 DIAGNOSIS — M7552 Bursitis of left shoulder: Secondary | ICD-10-CM | POA: Diagnosis not present

## 2022-05-02 ENCOUNTER — Encounter (HOSPITAL_COMMUNITY): Payer: Medicare PPO

## 2022-05-02 DIAGNOSIS — M25512 Pain in left shoulder: Secondary | ICD-10-CM | POA: Diagnosis not present

## 2022-05-02 DIAGNOSIS — M7552 Bursitis of left shoulder: Secondary | ICD-10-CM | POA: Diagnosis not present

## 2022-05-02 DIAGNOSIS — M7532 Calcific tendinitis of left shoulder: Secondary | ICD-10-CM | POA: Diagnosis not present

## 2022-05-08 ENCOUNTER — Telehealth (HOSPITAL_COMMUNITY): Payer: Self-pay | Admitting: *Deleted

## 2022-05-08 ENCOUNTER — Encounter (HOSPITAL_COMMUNITY): Payer: Self-pay | Admitting: *Deleted

## 2022-05-08 ENCOUNTER — Ambulatory Visit: Payer: Medicare PPO

## 2022-05-08 DIAGNOSIS — E7800 Pure hypercholesterolemia, unspecified: Secondary | ICD-10-CM | POA: Diagnosis not present

## 2022-05-08 NOTE — Telephone Encounter (Signed)
My Chart letter sent outlining instructions for upcoming stress test on 05/12/22 at 10:45

## 2022-05-09 LAB — COMPREHENSIVE METABOLIC PANEL
ALT: 17 IU/L (ref 0–32)
AST: 22 IU/L (ref 0–40)
Albumin/Globulin Ratio: 2.3 — ABNORMAL HIGH (ref 1.2–2.2)
Albumin: 4.5 g/dL (ref 3.8–4.8)
Alkaline Phosphatase: 78 IU/L (ref 44–121)
BUN/Creatinine Ratio: 22 (ref 12–28)
BUN: 13 mg/dL (ref 8–27)
Bilirubin Total: 0.7 mg/dL (ref 0.0–1.2)
CO2: 27 mmol/L (ref 20–29)
Calcium: 10 mg/dL (ref 8.7–10.3)
Chloride: 101 mmol/L (ref 96–106)
Creatinine, Ser: 0.59 mg/dL (ref 0.57–1.00)
Globulin, Total: 2 g/dL (ref 1.5–4.5)
Glucose: 84 mg/dL (ref 70–99)
Potassium: 4.1 mmol/L (ref 3.5–5.2)
Sodium: 142 mmol/L (ref 134–144)
Total Protein: 6.5 g/dL (ref 6.0–8.5)
eGFR: 92 mL/min/{1.73_m2} (ref >59)

## 2022-05-09 LAB — LIPID PANEL
Chol/HDL Ratio: 2 ratio (ref 0.0–4.4)
Cholesterol, Total: 179 mg/dL (ref 100–199)
HDL: 89 mg/dL (ref >39)
LDL Chol Calc (NIH): 78 mg/dL (ref 0–99)
Triglycerides: 61 mg/dL (ref 0–149)
VLDL Cholesterol Cal: 12 mg/dL (ref 5–40)

## 2022-05-12 ENCOUNTER — Ambulatory Visit (HOSPITAL_COMMUNITY): Payer: Medicare PPO | Attending: Cardiovascular Disease

## 2022-05-12 DIAGNOSIS — I2510 Atherosclerotic heart disease of native coronary artery without angina pectoris: Secondary | ICD-10-CM | POA: Diagnosis not present

## 2022-05-12 DIAGNOSIS — R0609 Other forms of dyspnea: Secondary | ICD-10-CM | POA: Insufficient documentation

## 2022-05-12 LAB — MYOCARDIAL PERFUSION IMAGING
Angina Index: 0
Duke Treadmill Score: -1
Estimated workload: 6.1
Exercise duration (min): 4 min
Exercise duration (sec): 15 s
LV dias vol: 47 mL (ref 46–106)
LV sys vol: 12 mL
MPHR: 142 {beats}/min
Nuc Stress EF: 75 %
Peak HR: 137 {beats}/min
Percent HR: 96 %
RPE: 18
Rest HR: 53 {beats}/min
Rest Nuclear Isotope Dose: 10.7 mCi
SDS: 5
SRS: 2
SSS: 7
ST Depression (mm): 1 mm
Stress Nuclear Isotope Dose: 30.7 mCi
TID: 0.83

## 2022-05-12 MED ORDER — TECHNETIUM TC 99M TETROFOSMIN IV KIT
10.7000 | PACK | Freq: Once | INTRAVENOUS | Status: AC | PRN
  Administered 2022-05-12: 10.7 via INTRAVENOUS

## 2022-05-12 MED ORDER — TECHNETIUM TC 99M TETROFOSMIN IV KIT
30.7000 | PACK | Freq: Once | INTRAVENOUS | Status: AC | PRN
  Administered 2022-05-12: 30.7 via INTRAVENOUS

## 2022-05-21 ENCOUNTER — Telehealth (HOSPITAL_BASED_OUTPATIENT_CLINIC_OR_DEPARTMENT_OTHER): Payer: Self-pay

## 2022-05-21 ENCOUNTER — Telehealth (HOSPITAL_BASED_OUTPATIENT_CLINIC_OR_DEPARTMENT_OTHER): Payer: Self-pay | Admitting: Family

## 2022-05-21 DIAGNOSIS — E7800 Pure hypercholesterolemia, unspecified: Secondary | ICD-10-CM

## 2022-05-21 MED ORDER — ROSUVASTATIN CALCIUM 10 MG PO TABS: 10.0000 mg | ORAL_TABLET | Freq: Every day | ORAL | 3 refills | Status: DC

## 2022-05-21 NOTE — Telephone Encounter (Addendum)
Left message for patient to call back    ----- Message from Loel Dubonnet, NP sent at 05/21/2022 12:51 PM EST ----- Normal kidneys, liver, electrolytes.  Lipid panel with LDL (bad cholesterol) of 78. Not yet at goal of <70.   Please call her with the following options... She may either increase Rosuvastatin to '10mg'$  daily Or Continue rosuvastatin '5mg'$  daily and add Zetia '10mg'$  daily.  Repeat FLP/LFT in 3 months

## 2022-05-21 NOTE — Telephone Encounter (Signed)
Follow Up:      Patient is returning a call from [facility], concerning her lab results.

## 2022-05-21 NOTE — Telephone Encounter (Signed)
Returned call to patient, results reviewed with patient, prescription updated and patient has decided to trial the '10mg'$  dose.     "----- Message from Loel Dubonnet, NP sent at 05/21/2022 12:51 PM EST ----- Normal kidneys, liver, electrolytes.  Lipid panel with LDL (bad cholesterol) of 78. Not yet at goal of <70.    Please call her with the following options... She may either increase Rosuvastatin to '10mg'$  daily Or Continue rosuvastatin '5mg'$  daily and add Zetia '10mg'$  daily.   Repeat FLP/LFT in 3 months"

## 2022-06-06 ENCOUNTER — Telehealth: Payer: Self-pay | Admitting: Cardiovascular Disease

## 2022-06-06 MED ORDER — EZETIMIBE 10 MG PO TABS: 10.0000 mg | ORAL_TABLET | Freq: Every day | ORAL | 3 refills | Status: DC

## 2022-06-06 NOTE — Telephone Encounter (Signed)
Rx to pharm, patient notified via VM

## 2022-06-06 NOTE — Telephone Encounter (Signed)
Patient called and said that she would rather take Ezetimibe (Zetia) instead of increasing her rosuvastatin (CRESTOR) 10 MG tablet. Wants to know if medication can be sent to [facility]

## 2022-06-07 DIAGNOSIS — M25512 Pain in left shoulder: Secondary | ICD-10-CM | POA: Diagnosis not present

## 2022-06-20 DIAGNOSIS — M25512 Pain in left shoulder: Secondary | ICD-10-CM | POA: Diagnosis not present

## 2022-06-24 ENCOUNTER — Telehealth: Payer: Self-pay | Admitting: Internal Medicine

## 2022-06-25 ENCOUNTER — Other Ambulatory Visit (HOSPITAL_COMMUNITY): Payer: Self-pay

## 2022-06-25 NOTE — Telephone Encounter (Signed)
Per test claim generic Flovent HFA is covered. Patient does still have a deductible to meet per response from insurance.

## 2022-06-25 NOTE — Telephone Encounter (Signed)
Spoke with patient advised per pharmacy team Flovent is covered and patient has a deductible she hasn't met yet. She is going to contact her insurance to see if there is something cheaper and see if there going to cover flovent. She received a letter stating they wouldn't. Will leave encounter open

## 2022-06-25 NOTE — Telephone Encounter (Signed)
Since Flovent is no longer covered by her insurance, can you run a ticket to see what is preferred. Thank you

## 2022-07-03 ENCOUNTER — Other Ambulatory Visit (HOSPITAL_BASED_OUTPATIENT_CLINIC_OR_DEPARTMENT_OTHER): Payer: Self-pay | Admitting: Family

## 2022-07-04 NOTE — Telephone Encounter (Signed)
Spoke with patient. She called her insurance and they are covering the generic Flovent. Dr. Chase Caller are you okay with [place] sending that in? Please advise

## 2022-07-07 ENCOUNTER — Other Ambulatory Visit: Payer: Self-pay

## 2022-07-07 MED ORDER — FLUTICASONE PROPIONATE HFA 220 MCG/ACT IN AERO: 2.0000 | INHALATION_SPRAY | Freq: Two times a day (BID) | RESPIRATORY_TRACT | 12 refills | Status: DC

## 2022-07-07 NOTE — Telephone Encounter (Signed)
Floevent 22mg 2 puff bid

## 2022-07-07 NOTE — Telephone Encounter (Signed)
Advised patient generic flovent has been sent to her pharmacy. Nothing further needed.

## 2022-08-01 DIAGNOSIS — M25512 Pain in left shoulder: Secondary | ICD-10-CM | POA: Diagnosis not present

## 2022-09-08 DIAGNOSIS — E7800 Pure hypercholesterolemia, unspecified: Secondary | ICD-10-CM | POA: Diagnosis not present

## 2022-09-08 DIAGNOSIS — C50412 Malignant neoplasm of upper-outer quadrant of left female breast: Secondary | ICD-10-CM | POA: Diagnosis not present

## 2022-09-09 ENCOUNTER — Telehealth (HOSPITAL_BASED_OUTPATIENT_CLINIC_OR_DEPARTMENT_OTHER): Payer: Self-pay

## 2022-09-09 DIAGNOSIS — E7800 Pure hypercholesterolemia, unspecified: Secondary | ICD-10-CM

## 2022-09-09 LAB — HEPATIC FUNCTION PANEL
ALT: 19 IU/L (ref 0–32)
AST: 22 IU/L (ref 0–40)
Albumin: 4.2 g/dL (ref 3.8–4.8)
Alkaline Phosphatase: 79 IU/L (ref 44–121)
Bilirubin Total: 0.8 mg/dL (ref 0.0–1.2)
Bilirubin, Direct: 0.21 mg/dL (ref 0.00–0.40)
Total Protein: 6.2 g/dL (ref 6.0–8.5)

## 2022-09-09 LAB — LIPID PANEL
Chol/HDL Ratio: 2.1 ratio (ref 0.0–4.4)
Cholesterol, Total: 200 mg/dL — ABNORMAL HIGH (ref 100–199)
HDL: 96 mg/dL (ref >39)
LDL Chol Calc (NIH): 91 mg/dL (ref 0–99)
Triglycerides: 74 mg/dL (ref 0–149)
VLDL Cholesterol Cal: 13 mg/dL (ref 5–40)

## 2022-09-09 NOTE — Addendum Note (Signed)
Addended by: Marlene Lard on: 09/09/2022 04:38 PM   Modules accepted: Orders

## 2022-09-09 NOTE — Telephone Encounter (Signed)
Restart rosuv and check labs again in 3 months?

## 2022-09-09 NOTE — Telephone Encounter (Signed)
Very easy to mix up when we have medication changes! Recommend resume Rosuvastatin 10mg  daily. She may take it with her Zetia 10mg  daily and Aspirin 81mg  daily. Recommend update FLP/LFT in 2-3 months to reassess.   Alver Sorrow, NP

## 2022-09-09 NOTE — Telephone Encounter (Addendum)
Seen by patient Dominique Cobb on 09/09/2022 11:16 AM; Follow up mychart message sent to patient.    ----- Message from Alver Sorrow, NP sent at 09/09/2022  7:44 AM EDT ----- Normal liver function.  Cholesterol has increased from previous.  LDL not at goal of less than 70.  Please ensure taking rosuvastatin 10 mg daily as well as Zetia 10 mg daily.  Pending what she is taking we may adjust her medication regimen.

## 2022-09-26 ENCOUNTER — Other Ambulatory Visit (HOSPITAL_BASED_OUTPATIENT_CLINIC_OR_DEPARTMENT_OTHER): Payer: Self-pay | Admitting: Family

## 2022-10-13 ENCOUNTER — Other Ambulatory Visit: Payer: Self-pay | Admitting: Internal Medicine

## 2022-10-13 DIAGNOSIS — Z9889 Other specified postprocedural states: Secondary | ICD-10-CM

## 2022-10-24 ENCOUNTER — Ambulatory Visit
Admission: RE | Admit: 2022-10-24 | Discharge: 2022-10-24 | Disposition: A | Discharge status: Home / Self Care | Payer: Medicare PPO | Source: Ambulatory Visit | Attending: Internal Medicine | Admitting: Internal Medicine

## 2022-10-24 DIAGNOSIS — Z9889 Other specified postprocedural states: Secondary | ICD-10-CM

## 2022-10-24 DIAGNOSIS — Z853 Personal history of malignant neoplasm of breast: Secondary | ICD-10-CM | POA: Diagnosis not present

## 2022-11-11 ENCOUNTER — Telehealth: Payer: Self-pay | Admitting: Internal Medicine

## 2022-11-11 NOTE — Telephone Encounter (Signed)
Patient called to inform the doctor that her insurance will no longer cover her previous inhaler.  They stated that they will cover the Arnuity Elipta powder.  Patient will need a new script for this inhaler.  Please advise and call patient if you need to discuss further.  CB# [phone]

## 2022-11-14 NOTE — Telephone Encounter (Signed)
Called and spoke with patient. She stated that her insurance will no longer cover generic Flovent . She called the insurance to check for alternatives and they stated that they will cover Arnuity. She is aware that I will send a message to MR to see if he is ok with this change.   Confirmed her pharmacy as [facility].   MR, please advise if you are ok with her switching to Arnuity. Thanks!

## 2022-11-16 NOTE — Telephone Encounter (Signed)
She can do 200 mcg of Arnuity dosing 1 puff once daily.  Please let her know that it is just a superior version of Flovent

## 2022-11-17 NOTE — Telephone Encounter (Signed)
ATC X1 LVM for patient to call the office back 

## 2022-11-18 MED ORDER — ARNUITY ELLIPTA 200 MCG/ACT IN AEPB: 1.0000 | INHALATION_SPRAY | Freq: Every day | RESPIRATORY_TRACT | 11 refills | Status: DC

## 2022-11-18 NOTE — Telephone Encounter (Signed)
I called and spoke with the pt and notified of response per MR  She verbalized understanding  Rx has been sent to pharm  Nothing further needed

## 2022-12-08 DIAGNOSIS — E7800 Pure hypercholesterolemia, unspecified: Secondary | ICD-10-CM | POA: Diagnosis not present

## 2022-12-09 LAB — HEPATIC FUNCTION PANEL
ALT: 21 IU/L (ref 0–32)
AST: 22 IU/L (ref 0–40)
Albumin: 4.3 g/dL (ref 3.8–4.8)
Alkaline Phosphatase: 84 IU/L (ref 44–121)
Bilirubin Total: 0.8 mg/dL (ref 0.0–1.2)
Bilirubin, Direct: 0.24 mg/dL (ref 0.00–0.40)
Total Protein: 6.1 g/dL (ref 6.0–8.5)

## 2022-12-09 LAB — LIPID PANEL
Chol/HDL Ratio: 1.8 ratio (ref 0.0–4.4)
Cholesterol, Total: 151 mg/dL (ref 100–199)
HDL: 85 mg/dL (ref >39)
LDL Chol Calc (NIH): 53 mg/dL (ref 0–99)
Triglycerides: 63 mg/dL (ref 0–149)
VLDL Cholesterol Cal: 13 mg/dL (ref 5–40)

## 2023-02-09 DIAGNOSIS — J449 Chronic obstructive pulmonary disease, unspecified: Secondary | ICD-10-CM | POA: Diagnosis not present

## 2023-02-09 DIAGNOSIS — Z79899 Other long term (current) drug therapy: Secondary | ICD-10-CM | POA: Diagnosis not present

## 2023-02-09 DIAGNOSIS — R41840 Attention and concentration deficit: Secondary | ICD-10-CM | POA: Diagnosis not present

## 2023-02-09 DIAGNOSIS — M810 Age-related osteoporosis without current pathological fracture: Secondary | ICD-10-CM | POA: Diagnosis not present

## 2023-02-09 DIAGNOSIS — Z1331 Encounter for screening for depression: Secondary | ICD-10-CM | POA: Diagnosis not present

## 2023-02-09 DIAGNOSIS — I700 Atherosclerosis of aorta: Secondary | ICD-10-CM | POA: Diagnosis not present

## 2023-02-09 DIAGNOSIS — E559 Vitamin D deficiency, unspecified: Secondary | ICD-10-CM | POA: Diagnosis not present

## 2023-02-09 DIAGNOSIS — Z853 Personal history of malignant neoplasm of breast: Secondary | ICD-10-CM | POA: Diagnosis not present

## 2023-02-09 DIAGNOSIS — Z0000 Encounter for general adult medical examination without abnormal findings: Secondary | ICD-10-CM | POA: Diagnosis not present

## 2023-02-09 DIAGNOSIS — I2510 Atherosclerotic heart disease of native coronary artery without angina pectoris: Secondary | ICD-10-CM | POA: Diagnosis not present

## 2023-02-09 DIAGNOSIS — F329 Major depressive disorder, single episode, unspecified: Secondary | ICD-10-CM | POA: Diagnosis not present

## 2023-02-24 ENCOUNTER — Ambulatory Visit
Admission: EM | Admit: 2023-02-24 | Discharge: 2023-02-24 | Disposition: A | Discharge status: Home / Self Care | Payer: Medicare PPO

## 2023-02-24 DIAGNOSIS — J069 Acute upper respiratory infection, unspecified: Secondary | ICD-10-CM | POA: Diagnosis not present

## 2023-02-24 NOTE — Discharge Instructions (Signed)
It appears that he most likely have a viral upper respiratory infection. Continue use of your albuterol inhaler as needed for wheezing, cough, or shortness of breath. Recommend over-the-counter Mucinex DM or Robitussin DM for your cough. May take over-the-counter Tylenol as needed for pain, fever, general discomfort. You may use your Flonase nasal spray as directed.  Recommend saline nasal spray throughout the day to help with nasal congestion. For your cough, you may also find it helpful to sleep elevated on pillows while cough symptoms persist. If your symptoms have not improved over the next 7 to 10 days, or suddenly worsen before that time, please follow-up in this clinic or with your primary care physician for further evaluation. Follow-up as needed.

## 2023-02-24 NOTE — ED Provider Notes (Signed)
[facility]    CSN: [id number] Arrival date & time: 02/24/23  1347      History   Chief Complaint No chief complaint on file.   HPI Dominique Cobb is a 77 y.o. female.   The history is provided by the patient.   Patient presents with a 3-day history of nasal congestion, sore throat, right ear pain, cough, and chest congestion.  Reports sore throat has since improved.  Patient denies fever, chills, ear drainage, difficulty breathing, shortness of breath, abdominal pain, nausea, vomiting, or diarrhea.  Patient reports that she does have a history of COPD.  She reports that she has been using her albuterol inhaler intermittently since symptoms started.  She reports she has not been taking any other medication for her symptoms.  Patient reports she took a home COVID test which was negative.  Past Medical History:  Diagnosis Date   Anxiety    Aortic atherosclerosis (HCC)    Arthritis    Asthma    CAD in native artery 04/28/2022   Depression    Dysphagia    Dyspnea    on exertion   Family history of adverse reaction to anesthesia    father's lung collapsed during lumbar surgery   GERD (gastroesophageal reflux disease)    Pure hypercholesterolemia 04/28/2022    Patient Active Problem List   Diagnosis Date Noted   CAD in native artery 04/28/2022   Pure hypercholesterolemia 04/28/2022   Asthma 07/23/2020   Pulmonary nodules 07/23/2020   Centrilobular emphysema (HCC) 07/23/2020   Breast cancer of lower-outer quadrant of left female breast (HCC) 02/15/2020   Change in stool 10/15/2012   Dysphagia 03/20/2011   GERD (gastroesophageal reflux disease) 03/20/2011   Depression 03/20/2011    Past Surgical History:  Procedure Laterality Date   BREAST CYST ASPIRATION Left    BREAST EXCISIONAL BIOPSY Left    BREAST LUMPECTOMY     BREAST LUMPECTOMY WITH RADIOACTIVE SEED AND SENTINEL LYMPH NODE BIOPSY Left 03/08/2020   Procedure: LEFT BREAST LUMPECTOMY WITH RADIOACTIVE  SEED AND LEFT AXILLARY SENTINEL LYMPH NODE BIOPSY;  Surgeon: Emelia Loron, MD;  Location: [facility];  Service: General;  Laterality: Left;  PEC BLOCK   BREAST SURGERY     benign.  25 yrs ago   CARPAL TUNNEL RELEASE  1980's   bilateral    CATARACT EXTRACTION W/PHACO Left 06/12/2016   Procedure: CATARACT EXTRACTION PHACO AND INTRAOCULAR LENS PLACEMENT LEFT EYE PIR5.18;  Surgeon: Gemma Payor, MD;  Location: [facility];  Service: Ophthalmology;  Laterality: Left;  left   CATARACT EXTRACTION W/PHACO Right 07/24/2016   Procedure: CATARACT EXTRACTION PHACO AND INTRAOCULAR LENS PLACEMENT (IOC);  Surgeon: Gemma Payor, MD;  Location: [facility];  Service: Ophthalmology;  Laterality: Right;  right cde 8.29   COLONOSCOPY N/A 10/29/2012   Procedure: COLONOSCOPY;  Surgeon: Malissa Hippo, MD;  Location: [facility];  Service: Endoscopy;  Laterality: N/A;  140   Dental implants     FINGER SURGERY     trigger finger 3rd  bilaterally   laproscopy     over 20 yrs ago   NECK SURGERY     2007   TONSILLECTOMY      OB History   No obstetric history on file.      Home Medications    Prior to Admission medications   Medication Sig Start Date End Date Taking? Authorizing Provider  albuterol (VENTOLIN HFA) 108 (90 Base) MCG/ACT inhaler Inhale 2 puffs into the lungs every  6 (six) hours as needed for wheezing or shortness of breath. 03/20/22   Kalman Shan, MD  aspirin EC 81 MG tablet Take 81 mg by mouth daily. Swallow whole.    [provider]  Cholecalciferol (VITAMIN D3) 50 MCG (2000 UT) capsule Take 2,000 Units by mouth daily.    [provider]  ezetimibe (ZETIA) 10 MG tablet Take 1 tablet (10 mg total) by mouth daily. 06/06/22 06/01/23  Alver Sorrow, NP  fluticasone (FLONASE) 50 MCG/ACT nasal spray Place 1 spray into both nostrils daily. 03/20/22   Kalman Shan, MD  fluticasone (FLOVENT HFA) 220 MCG/ACT inhaler Inhale 2 puffs into the lungs in the morning and at bedtime.  03/20/22   Kalman Shan, MD  fluticasone (FLOVENT HFA) 220 MCG/ACT inhaler Inhale 2 puffs into the lungs 2 (two) times daily. Please fill generic. Insurance only covers generic flovent 07/07/22   Kalman Shan, MD  Fluticasone Furoate (ARNUITY ELLIPTA) 200 MCG/ACT AEPB Inhale 1 puff into the lungs daily. 11/18/22   Kalman Shan, MD  rosuvastatin (CRESTOR) 10 MG tablet Take 1 tablet (10 mg total) by mouth daily. 05/21/22   Alver Sorrow, NP  sertraline (ZOLOFT) 100 MG tablet Take 1.5 tablets by mouth daily.    [provider]  zolpidem (AMBIEN) 5 MG tablet Take 5 mg by mouth at bedtime as needed for sleep.     [provider]    Family History Family History  Problem Relation Age of Onset   Alcoholism Mother    Depression Mother    Arthritis Father    Thyroid disease Father    Heart attack Maternal Uncle    Heart attack Paternal Uncle    Hypertension Maternal Grandmother    Dementia Maternal Grandmother    Arthritis Maternal Grandfather    Hip fracture Paternal Grandmother    Heart disease Paternal Grandfather     Social History Social History   Tobacco Use   Smoking status: Former    Current packs/day: 0.00    Average packs/day: 2.0 packs/day for 40.0 years (80.0 ttl pk-yrs)    Types: Cigarettes    Start date: 09/04/1964    Quit date: 09/04/2004    Years since quitting: 18.4   Smokeless tobacco: Never  Vaping Use   Vaping status: Never Used  Substance Use Topics   Alcohol use: Yes    Comment: occasionally+   Drug use: No     Allergies   Codeine   Review of Systems Review of Systems Per HPI  Physical Exam Triage Vital Signs ED Triage Vitals  Encounter Vitals Group     BP 02/24/23 1446 120/74     Systolic BP Percentile --      Diastolic BP Percentile --      Pulse Rate 02/24/23 1446 70     Resp 02/24/23 1446 15     Temp 02/24/23 1446 98.1 F (36.7 C)     Temp Source 02/24/23 1446 Oral     SpO2 02/24/23 1446 96 %     Weight  --      Height --      Head Circumference --      Peak Flow --      Pain Score 02/24/23 1447 0     Pain Loc --      Pain Education --      Exclude from Growth Chart --    No data found.  Updated Vital Signs BP 120/74 (BP Location: Right Arm)  Pulse 70   Temp 98.1 F (36.7 C) (Oral)   Resp 15   SpO2 96%   Visual Acuity Right Eye Distance:   Left Eye Distance:   Bilateral Distance:    Right Eye Near:   Left Eye Near:    Bilateral Near:     Physical Exam Vitals and nursing note reviewed.  Constitutional:      General: She is not in acute distress.    Appearance: Normal appearance.  HENT:     Head: Normocephalic.     Right Ear: Tympanic membrane, ear canal and external ear normal.     Left Ear: Tympanic membrane, ear canal and external ear normal.     Nose: Congestion present.     Right Turbinates: Enlarged and swollen.     Left Turbinates: Enlarged and swollen.     Right Sinus: No maxillary sinus tenderness or frontal sinus tenderness.     Left Sinus: No maxillary sinus tenderness or frontal sinus tenderness.     Mouth/Throat:     Lips: Pink.     Mouth: Mucous membranes are moist.     Pharynx: Posterior oropharyngeal erythema and postnasal drip present. No pharyngeal swelling, oropharyngeal exudate or uvula swelling.  Eyes:     Extraocular Movements: Extraocular movements intact.     Conjunctiva/sclera: Conjunctivae normal.     Pupils: Pupils are equal, round, and reactive to light.  Cardiovascular:     Rate and Rhythm: Normal rate and regular rhythm.     Pulses: Normal pulses.     Heart sounds: Normal heart sounds.  Pulmonary:     Effort: Pulmonary effort is normal. No respiratory distress.     Breath sounds: Normal breath sounds. No stridor. No wheezing, rhonchi or rales.  Abdominal:     General: Bowel sounds are normal.     Palpations: Abdomen is soft.     Tenderness: There is no abdominal tenderness.  Musculoskeletal:     Cervical back: Normal range of  motion.  Lymphadenopathy:     Cervical: No cervical adenopathy.  Skin:    General: Skin is warm and dry.  Neurological:     General: No focal deficit present.     Mental Status: She is alert and oriented to person, place, and time.  Psychiatric:        Mood and Affect: Mood normal.        Behavior: Behavior normal.      UC Treatments / Results  Labs (all labs ordered are listed, but only abnormal results are displayed) Labs Reviewed - No data to display  EKG   Radiology No results found.  Procedures Procedures (including critical care time)  Medications Ordered in UC Medications - No data to display  Initial Impression / Assessment and Plan / UC Course  I have reviewed the triage vital signs and the nursing notes.  Pertinent labs & imaging results that were available during my care of the patient were reviewed by me and considered in my medical decision making (see chart for details).  The patient is well-appearing, she is in no acute distress, vital signs are stable.  Suspect a viral upper respiratory infection with cough.  On exam, lung sounds are clear throughout, room air sats at 96%.  Do not suspect underlying COPD exacerbation at this time.  Supportive care recommendations were provided and discussed with the patient to include continued use of her albuterol inhaler as needed for wheezing, cough, shortness of breath, and use of her  fluticasone 50 mcg nasal spray that she has on hand.  Patient also advised to purchase over-the-counter Robitussin or Mucinex DM for her cough.  Additional instructions include over-the-counter analgesics for pain or discomfort, normal saline nasal spray for nasal congestion, and use of a humidifier in her bedroom at nighttime during sleep.  Patient was given strict follow-up precautions.  Patient is in agreement with this plan of care and verbalizes understanding.  All questions were answered.  Patient stable for discharge.  Final Clinical  Impressions(s) / UC Diagnoses   Final diagnoses:  Viral upper respiratory tract infection with cough     Discharge Instructions      It appears that he most likely have a viral upper respiratory infection. Continue use of your albuterol inhaler as needed for wheezing, cough, or shortness of breath. Recommend over-the-counter Mucinex DM or Robitussin DM for your cough. May take over-the-counter Tylenol as needed for pain, fever, general discomfort. You may use your Flonase nasal spray as directed.  Recommend saline nasal spray throughout the day to help with nasal congestion. For your cough, you may also find it helpful to sleep elevated on pillows while cough symptoms persist. If your symptoms have not improved over the next 7 to 10 days, or suddenly worsen before that time, please follow-up in this clinic or with your primary care physician for further evaluation. Follow-up as needed.     ED Prescriptions   None    PDMP not reviewed this encounter.   Abran Cantor, NP 02/24/23 1538

## 2023-02-24 NOTE — ED Triage Notes (Signed)
Pt c/o sore throat, right ear pain, cough, chest, and nasal congestion x 3 days,  sore throat is now gone.

## 2023-04-13 DIAGNOSIS — C50412 Malignant neoplasm of upper-outer quadrant of left female breast: Secondary | ICD-10-CM | POA: Diagnosis not present

## 2023-04-21 DIAGNOSIS — C50412 Malignant neoplasm of upper-outer quadrant of left female breast: Secondary | ICD-10-CM | POA: Diagnosis not present

## 2023-06-22 ENCOUNTER — Other Ambulatory Visit (HOSPITAL_BASED_OUTPATIENT_CLINIC_OR_DEPARTMENT_OTHER): Payer: Self-pay | Admitting: Family

## 2023-06-22 DIAGNOSIS — E7800 Pure hypercholesterolemia, unspecified: Secondary | ICD-10-CM

## 2023-07-20 ENCOUNTER — Other Ambulatory Visit (HOSPITAL_BASED_OUTPATIENT_CLINIC_OR_DEPARTMENT_OTHER): Payer: Self-pay | Admitting: Cardiovascular Disease

## 2023-07-20 DIAGNOSIS — E7800 Pure hypercholesterolemia, unspecified: Secondary | ICD-10-CM

## 2023-07-21 ENCOUNTER — Telehealth: Payer: Self-pay | Admitting: Cardiovascular Disease

## 2023-07-21 DIAGNOSIS — E7800 Pure hypercholesterolemia, unspecified: Secondary | ICD-10-CM

## 2023-07-21 NOTE — Telephone Encounter (Signed)
*  STAT* If patient is at the pharmacy, call can be transferred to refill team.   1. Which medications need to be refilled? (please list name of each medication and dose if known)   rosuvastatin (CRESTOR) 10 MG tablet    ezetimibe (ZETIA) 10 MG tablet     4. Which pharmacy/location (including street and city if local pharmacy) is medication to be sent to?  [facility] [place], [place] - [address] Phone: [phone]  Fax: [phone]       5. Do they need a 30 day or 90 day supply? 90  Pt is scheduled for 09/14/23 w/ Philis Nettle, NP

## 2023-07-22 MED ORDER — EZETIMIBE 10 MG PO TABS: 10.0000 mg | ORAL_TABLET | Freq: Every day | ORAL | 0 refills | Status: DC

## 2023-07-22 MED ORDER — ROSUVASTATIN CALCIUM 10 MG PO TABS: 10.0000 mg | ORAL_TABLET | Freq: Every day | ORAL | 0 refills | Status: DC

## 2023-07-30 ENCOUNTER — Encounter: Payer: Self-pay | Admitting: Internal Medicine

## 2023-07-30 ENCOUNTER — Ambulatory Visit
Admission: EM | Admit: 2023-07-30 | Discharge: 2023-07-30 | Disposition: A | Discharge status: Home / Self Care | Payer: Medicare PPO | Attending: Family Medicine | Admitting: Family Medicine

## 2023-07-30 DIAGNOSIS — J441 Chronic obstructive pulmonary disease with (acute) exacerbation: Secondary | ICD-10-CM

## 2023-07-30 LAB — POC COVID19/FLU A&B COMBO
Covid Antigen, POC: NEGATIVE
Influenza A Antigen, POC: NEGATIVE
Influenza B Antigen, POC: NEGATIVE

## 2023-07-30 MED ORDER — FLUTICASONE PROPIONATE 50 MCG/ACT NA SUSP: 1.0000 | Freq: Two times a day (BID) | NASAL | 2 refills | Status: AC

## 2023-07-30 MED ORDER — BENZONATATE 200 MG PO CAPS: 200.0000 mg | ORAL_CAPSULE | Freq: Three times a day (TID) | ORAL | 0 refills | Status: AC | PRN

## 2023-07-30 MED ORDER — DEXAMETHASONE SODIUM PHOSPHATE 10 MG/ML IJ SOLN
10.0000 mg | Freq: Once | INTRAMUSCULAR | Status: AC
Start: 2023-07-30 — End: 2023-07-30
  Administered 2023-07-30: 10 mg via INTRAMUSCULAR

## 2023-07-30 NOTE — ED Provider Notes (Signed)
 [facility]    CSN: [id number] Arrival date & time: 07/30/23  1220      History   Chief Complaint No chief complaint on file.   HPI Dominique Cobb is a 78 y.o. female.   Patient presenting today with 3-day history of nasal congestion, sore throat, headache, cough, wheezing, chest tightness.  Denies fever, chills, body aches, chest pain, severe shortness of breath, abdominal pain, vomiting, diarrhea.  So far trying over-the-counter remedies and her typical inhaler regimen for emphysema and asthma with minimal relief.  No new sick contacts recently.    Past Medical History:  Diagnosis Date   Anxiety    Aortic atherosclerosis (HCC)    Arthritis    Asthma    CAD in native artery 04/28/2022   Depression    Dysphagia    Dyspnea    on exertion   Family history of adverse reaction to anesthesia    father's lung collapsed during lumbar surgery   GERD (gastroesophageal reflux disease)    Pure hypercholesterolemia 04/28/2022    Patient Active Problem List   Diagnosis Date Noted   CAD in native artery 04/28/2022   Pure hypercholesterolemia 04/28/2022   Asthma 07/23/2020   Pulmonary nodules 07/23/2020   Centrilobular emphysema (HCC) 07/23/2020   Breast cancer of lower-outer quadrant of left female breast (HCC) 02/15/2020   Change in stool 10/15/2012   Dysphagia 03/20/2011   GERD (gastroesophageal reflux disease) 03/20/2011   Depression 03/20/2011    Past Surgical History:  Procedure Laterality Date   BREAST CYST ASPIRATION Left    BREAST EXCISIONAL BIOPSY Left    BREAST LUMPECTOMY     BREAST LUMPECTOMY WITH RADIOACTIVE SEED AND SENTINEL LYMPH NODE BIOPSY Left 03/08/2020   Procedure: LEFT BREAST LUMPECTOMY WITH RADIOACTIVE SEED AND LEFT AXILLARY SENTINEL LYMPH NODE BIOPSY;  Surgeon: Emelia Loron, MD;  Location: [facility];  Service: General;  Laterality: Left;  PEC BLOCK   BREAST SURGERY     benign.  25 yrs ago   CARPAL TUNNEL RELEASE  1980's   bilateral     CATARACT EXTRACTION W/PHACO Left 06/12/2016   Procedure: CATARACT EXTRACTION PHACO AND INTRAOCULAR LENS PLACEMENT LEFT EYE DGL8.75;  Surgeon: Gemma Payor, MD;  Location: [facility];  Service: Ophthalmology;  Laterality: Left;  left   CATARACT EXTRACTION W/PHACO Right 07/24/2016   Procedure: CATARACT EXTRACTION PHACO AND INTRAOCULAR LENS PLACEMENT (IOC);  Surgeon: Gemma Payor, MD;  Location: [facility];  Service: Ophthalmology;  Laterality: Right;  right cde 8.29   COLONOSCOPY N/A 10/29/2012   Procedure: COLONOSCOPY;  Surgeon: Malissa Hippo, MD;  Location: [facility];  Service: Endoscopy;  Laterality: N/A;  140   Dental implants     FINGER SURGERY     trigger finger 3rd  bilaterally   laproscopy     over 20 yrs ago   NECK SURGERY     2007   TONSILLECTOMY      OB History   No obstetric history on file.      Home Medications    Prior to Admission medications   Medication Sig Start Date End Date Taking? Authorizing Provider  benzonatate (TESSALON) 200 MG capsule Take 1 capsule (200 mg total) by mouth 3 (three) times daily as needed for cough. 07/30/23  Yes Particia Nearing, PA-C  fluticasone [facility]) 50 MCG/ACT nasal spray Place 1 spray into both nostrils 2 (two) times daily. 07/30/23  Yes Particia Nearing, PA-C  albuterol (VENTOLIN HFA) 108 (90 Base) MCG/ACT inhaler  Inhale 2 puffs into the lungs every 6 (six) hours as needed for wheezing or shortness of breath. 03/20/22   Kalman Shan, MD  aspirin EC 81 MG tablet Take 81 mg by mouth daily. Swallow whole.    [provider]  Cholecalciferol (VITAMIN D3) 50 MCG (2000 UT) capsule Take 2,000 Units by mouth daily.    [provider]  ezetimibe (ZETIA) 10 MG tablet Take 1 tablet (10 mg total) by mouth daily. 07/22/23 07/16/24  Chilton Si, MD  fluticasone (FLONASE) 50 MCG/ACT nasal spray Place 1 spray into both nostrils daily. 03/20/22   Kalman Shan, MD  fluticasone (FLOVENT HFA) 220 MCG/ACT inhaler  Inhale 2 puffs into the lungs in the morning and at bedtime. 03/20/22   Kalman Shan, MD  fluticasone (FLOVENT HFA) 220 MCG/ACT inhaler Inhale 2 puffs into the lungs 2 (two) times daily. Please fill generic. Insurance only covers generic flovent 07/07/22   Kalman Shan, MD  Fluticasone Furoate (ARNUITY ELLIPTA) 200 MCG/ACT AEPB Inhale 1 puff into the lungs daily. 11/18/22   Kalman Shan, MD  rosuvastatin (CRESTOR) 10 MG tablet Take 1 tablet (10 mg total) by mouth daily. 07/22/23   Chilton Si, MD  sertraline (ZOLOFT) 100 MG tablet Take 1.5 tablets by mouth daily.    [provider]  zolpidem (AMBIEN) 5 MG tablet Take 5 mg by mouth at bedtime as needed for sleep.     [provider]    Family History Family History  Problem Relation Age of Onset   Alcoholism Mother    Depression Mother    Arthritis Father    Thyroid disease Father    Heart attack Maternal Uncle    Heart attack Paternal Uncle    Hypertension Maternal Grandmother    Dementia Maternal Grandmother    Arthritis Maternal Grandfather    Hip fracture Paternal Grandmother    Heart disease Paternal Grandfather     Social History Social History   Tobacco Use   Smoking status: Former    Current packs/day: 0.00    Average packs/day: 2.0 packs/day for 40.0 years (80.0 ttl pk-yrs)    Types: Cigarettes    Start date: 09/04/1964    Quit date: 09/04/2004    Years since quitting: 18.9   Smokeless tobacco: Never  Vaping Use   Vaping status: Never Used  Substance Use Topics   Alcohol use: Yes    Comment: occasionally+   Drug use: No     Allergies   Codeine   Review of Systems Review of Systems Per HPI  Physical Exam Triage Vital Signs ED Triage Vitals  Encounter Vitals Group     BP 07/30/23 1426 131/77     Systolic BP Percentile --      Diastolic BP Percentile --      Pulse Rate 07/30/23 1426 68     Resp 07/30/23 1426 16     Temp 07/30/23 1426 98 F (36.7 C)     Temp Source  07/30/23 1426 Oral     SpO2 07/30/23 1426 92 %     Weight --      Height --      Head Circumference --      Peak Flow --      Pain Score 07/30/23 1428 0     Pain Loc --      Pain Education --      Exclude from Growth Chart --    No data found.  Updated Vital Signs BP 131/77 (BP  Location: Right Arm)   Pulse 68   Temp 98 F (36.7 C) (Oral)   Resp 16   SpO2 92%   Visual Acuity Right Eye Distance:   Left Eye Distance:   Bilateral Distance:    Right Eye Near:   Left Eye Near:    Bilateral Near:     Physical Exam Vitals and nursing note reviewed.  Constitutional:      Appearance: Normal appearance.  HENT:     Head: Atraumatic.     Right Ear: Tympanic membrane and external ear normal.     Left Ear: Tympanic membrane and external ear normal.     Nose: Rhinorrhea present.     Mouth/Throat:     Mouth: Mucous membranes are moist.     Pharynx: Posterior oropharyngeal erythema present.  Eyes:     Extraocular Movements: Extraocular movements intact.     Conjunctiva/sclera: Conjunctivae normal.  Cardiovascular:     Rate and Rhythm: Normal rate and regular rhythm.     Heart sounds: Normal heart sounds.  Pulmonary:     Effort: Pulmonary effort is normal.     Breath sounds: Wheezing present. No rales.  Musculoskeletal:        General: Normal range of motion.     Cervical back: Normal range of motion and neck supple.  Skin:    General: Skin is warm and dry.  Neurological:     Mental Status: She is alert and oriented to person, place, and time.  Psychiatric:        Mood and Affect: Mood normal.        Thought Content: Thought content normal.      UC Treatments / Results  Labs (all labs ordered are listed, but only abnormal results are displayed) Labs Reviewed  POC COVID19/FLU A&B COMBO    EKG   Radiology No results found.  Procedures Procedures (including critical care time)  Medications Ordered in UC Medications  dexamethasone (DECADRON) injection 10 mg  (10 mg Intramuscular Given 07/30/23 1521)    Initial Impression / Assessment and Plan / UC Course  I have reviewed the triage vital signs and the nursing notes.  Pertinent labs & imaging results that were available during my care of the patient were reviewed by me and considered in my medical decision making (see chart for details).     Vitals and exam reassuring today, rapid flu and COVID-negative.  Treat for COPD/asthma exacerbation with IM Decadron, inhaler regimen, Flonase, Tessalon, supportive home care.  Return for worsening symptoms.  Final Clinical Impressions(s) / UC Diagnoses   Final diagnoses:  COPD exacerbation [facility])   Discharge Instructions   None    ED Prescriptions     Medication Sig Dispense Auth. Provider   benzonatate (TESSALON) 200 MG capsule Take 1 capsule (200 mg total) by mouth 3 (three) times daily as needed for cough. 20 capsule Particia Nearing, PA-C   fluticasone [facility]) 50 MCG/ACT nasal spray Place 1 spray into both nostrils 2 (two) times daily. 16 g Particia Nearing, [place]      PDMP not reviewed this encounter.   Particia Nearing, [place] 07/30/23 [phone]

## 2023-07-30 NOTE — ED Triage Notes (Signed)
 Pt reports she has a cough, headache, nasal congestion, sore throat, and blood in nose x 3 days

## 2023-08-11 DIAGNOSIS — F411 Generalized anxiety disorder: Secondary | ICD-10-CM | POA: Diagnosis not present

## 2023-08-11 DIAGNOSIS — R419 Unspecified symptoms and signs involving cognitive functions and awareness: Secondary | ICD-10-CM | POA: Diagnosis not present

## 2023-08-11 DIAGNOSIS — F331 Major depressive disorder, recurrent, moderate: Secondary | ICD-10-CM | POA: Diagnosis not present

## 2023-09-09 NOTE — Progress Notes (Unsigned)
 Cardiology Office Note:  .   Date:  09/09/2023  ID:  ILLEANA Cobb, DOB November 13, 1945, MRN [id number] PCP: Thana Ates, MD  [facility] Providers Cardiologist:  Chilton Si, MD {  History of Present Illness: .   Dominique Cobb is a 78 y.o. female with 3V CAD on CT (Lexiscan Myoview 05/12/2022: low risk normal study without ischemia/infarction), HFpEF (Echo 03/21/22: LVF 60-65%), HLD, prior tobacco use, prior breast cancer, bursitis/tendinitis of her left arm who is being seen in OV for 1 year follow-up.    She was last seen in OV on 04/2022 for new patient evaluation of three-vessel CAD noted on chest CT including LM.  She was found to have coronary calcification on chest CT.  Noted that LE edema more common on the right, relieved with leg elevation.  Rosuvastatin was increased to 10 mg.  Recommended Lexiscan Myoview 05/2022, which showed low risk normal study without evidence of ischemia/infarction and hyperdynamic EF 75%. Started on ASA 81 mg daily. Follow up lipid panel on 05/2022 with LDL of 78 and 09/2022 with LDL 91. Zetia 10 mg was added in 09/2022.  Most recent lipid panel on 12/2022 showed LDL 53 and LFTs showed AST 22, ALT 21.   During interview, reported ... Previous smoker that quit in 2006.   Studies Reviewed: Marland Kitchen       CAROTID DUPLEX BILATERAL 10/17/2003 Mild atherosclerotic plaque formation is seen at proximal right internal carotid artery. No other significant plaque identified. On color Doppler imaging, laminar flow seen without turbulence or high velocity to suggest significant stenosis. Following peak systolic velocities obtained (cm per second):    Right Left  CCA 81 100  ICA 66 75  ECA 78 102  RATIO 0.81 0.94  Antegrade flow present bilateral vertebral arteries.   IMPRESSION  Minimal plaque proximal right internal carotid artery. No evidence of hemodynamically significant stenosis.   Chest CT IMPRESSION 09/02/2021: 1. Small 2-3 mm pulmonary nodules scattered  throughout both lungs, stable compared to the prior study considered definitively benign requiring no future imaging follow-up. 2. Aortic atherosclerosis, in addition to left main and three-vessel coronary artery disease. Assessment for potential risk factor modification, dietary therapy or pharmacologic therapy may be warranted, if clinically indicated. 3. Mild diffuse bronchial wall thickening with very mild centrilobular and paraseptal emphysema; imaging findings suggestive of underlying COPD. 4. There are calcifications of the aortic valve. Echocardiographic correlation for evaluation of potential valvular dysfunction may be warranted if clinically indicated.  Aortic Atherosclerosis (ICD10-I70.0) and Emphysema (ICD10-J43.9).  Echo 03/25/2022: IMPRESSIONS   1. Left ventricular ejection fraction, by estimation, is 60 to 65%. Left  ventricular ejection fraction by 3D volume is 64 %. The left ventricle has  normal function. The left ventricle has no regional wall motion  abnormalities. Left ventricular diastolic   parameters are consistent with Grade I diastolic dysfunction (impaired  relaxation). The average left ventricular global longitudinal strain is  -21.1 %. The global longitudinal strain is normal.   2. Right ventricular systolic function is normal. The right ventricular  size is normal. There is normal pulmonary artery systolic pressure.   3. The mitral valve is normal in structure. No evidence of mitral valve  regurgitation. No evidence of mitral stenosis.   4. The aortic valve is tricuspid. Aortic valve regurgitation is not  visualized. No aortic stenosis is present.   5. The inferior vena cava is normal in size with greater than 50%  respiratory variability, suggesting right atrial pressure  of 3 mmHg  Lexiscan Myoview 05/12/2022   The study is normal. The study is low risk.   LV perfusion is normal. There is no evidence of ischemia. There is no evidence of infarction.    A Bruce protocol stress test was performed. Exercise capacity was mildly impaired. Patient exercised for 4 min and 15 sec. Maximum HR of 137 bpm. MPHR 96.0 %. Peak METS 6.1 . The patient experienced no angina during the test. The test was stopped because the patient experienced fatigue. Normal blood pressure and normal heart rate response noted during stress. Heart rate recovery was normal.   1.0 mm of horizontal ST depression (III, aVF, V4, V5 and V6) was noted. ST depression began during recovery. ST deviation returned to baseline after > 5 mins of recovery. The ECG was positive for ischemia, in the setting of normal perfusion images.   Left ventricular function is normal. Nuclear stress EF: 75 %. The left ventricular ejection fraction is hyperdynamic (>65%). End diastolic cavity size is normal. End systolic cavity size is normal.   Prior study not available for comparison.   Risk Assessment/Calculations:   {Does this patient have ATRIAL FIBRILLATION?:[phone]} No BP recorded.  {Refresh Note OR Click here to enter BP  :1}***       Physical Exam:   VS:  There were no vitals taken for this visit.   Wt Readings from Last 3 Encounters:  05/12/22 125 lb (56.7 kg)  04/28/22 125 lb 14.4 oz (57.1 kg)  03/20/22 126 lb 12.8 oz (57.5 kg)    GEN: Well nourished, well developed in no acute distress NECK: No JVD; No carotid bruits CARDIAC: ***RRR, no murmurs, rubs, gallops RESPIRATORY:  Clear to auscultation without rales, wheezing or rhonchi  ABDOMEN: Soft, non-tender, non-distended EXTREMITIES:  No edema; No deformity   ASSESSMENT AND PLAN: .   CAD in native artery HLD - Lexiscan Myoview 05/2022: Low risk normal study without evidence of ischemia/infarction and hyperdynamic EF 75%.  - Most recent lipid panel on 12/2022 showed LDL 53 and LFTs showed AST 22, ALT 21. Goal LDL < 70 - Continue Rosuvastatin 10 mg and Zetia 10 mg.  HFpEF  - Echo 03/21/22: LVF 60-65%, grade-I diastolic dysfunction.   - Noted chronically LE edema more common in R > L.   Previous Tobacco user - quit smoking in 2006     {Are you ordering a CV Procedure (e.g. stress test, cath, DCCV, TEE, etc)?   Press F2        :[id number]}  Dispo: ***  Signed, Basilio Cairo, PA-C

## 2023-09-10 ENCOUNTER — Ambulatory Visit: Attending: Student | Admitting: Physician Assistant

## 2023-09-10 ENCOUNTER — Encounter: Payer: Self-pay | Admitting: Physician Assistant

## 2023-09-10 VITALS — BP 120/70 | HR 63 | Ht 64.0 in | Wt 126.6 lb

## 2023-09-10 DIAGNOSIS — R0602 Shortness of breath: Secondary | ICD-10-CM | POA: Diagnosis not present

## 2023-09-10 DIAGNOSIS — Z87891 Personal history of nicotine dependence: Secondary | ICD-10-CM | POA: Diagnosis not present

## 2023-09-10 DIAGNOSIS — E785 Hyperlipidemia, unspecified: Secondary | ICD-10-CM | POA: Diagnosis not present

## 2023-09-10 DIAGNOSIS — I2510 Atherosclerotic heart disease of native coronary artery without angina pectoris: Secondary | ICD-10-CM | POA: Diagnosis not present

## 2023-09-10 DIAGNOSIS — I5189 Other ill-defined heart diseases: Secondary | ICD-10-CM

## 2023-09-10 DIAGNOSIS — R600 Localized edema: Secondary | ICD-10-CM

## 2023-09-10 MED ORDER — ROSUVASTATIN CALCIUM 10 MG PO TABS: 10.0000 mg | ORAL_TABLET | Freq: Every day | ORAL | 3 refills | Status: AC

## 2023-09-10 MED ORDER — EZETIMIBE 10 MG PO TABS
10.0000 mg | ORAL_TABLET | Freq: Every day | ORAL | 3 refills | Status: AC
Start: 2023-09-10 — End: 2024-09-04

## 2023-09-10 NOTE — Patient Instructions (Signed)
 Medication Instructions:  Your physician recommends that you continue on your current medications as directed. Please refer to the Current Medication list given to you today.  *If you need a refill on your cardiac medications before your next appointment, please call your pharmacy*  Lab Work: NONE   If you have labs (blood work) drawn today and your tests are completely normal, you will receive your results only by: MyChart Message (if you have MyChart) OR A paper copy in the mail If you have any lab test that is abnormal or we need to change your treatment, we will call you to review the results.  Testing/Procedures: NONE    Follow-Up: At [facility], you and your health needs are our priority.  As part of our continuing mission to provide you with exceptional heart care, our providers are all part of one team.  This team includes your primary Cardiologist (physician) and Advanced Practice Providers or APPs (Physician Assistants and Nurse Practitioners) who all work together to provide you with the care you need, when you need it.  Your next appointment:   1 year(s)  Provider:   Nona Dell, MD or Renie Ora, PA-C     We recommend signing up for the patient portal called "MyChart".  Sign up information is provided on this After Visit Summary.  MyChart is used to connect with patients for Virtual Visits (Telemedicine).  Patients are able to view lab/test results, encounter notes, upcoming appointments, etc.  Non-urgent messages can be sent to your provider as well.   To learn more about what you can do with MyChart, go to [email or web address].   Other Instructions Thank you for choosing [facility]!

## 2023-09-14 ENCOUNTER — Ambulatory Visit: Payer: Medicare PPO | Admitting: Nurse Practitioner

## 2023-10-31 DIAGNOSIS — Z853 Personal history of malignant neoplasm of breast: Secondary | ICD-10-CM | POA: Diagnosis not present

## 2023-10-31 DIAGNOSIS — M810 Age-related osteoporosis without current pathological fracture: Secondary | ICD-10-CM | POA: Diagnosis not present

## 2023-10-31 DIAGNOSIS — I2510 Atherosclerotic heart disease of native coronary artery without angina pectoris: Secondary | ICD-10-CM | POA: Diagnosis not present

## 2023-10-31 DIAGNOSIS — J449 Chronic obstructive pulmonary disease, unspecified: Secondary | ICD-10-CM | POA: Diagnosis not present

## 2023-11-27 ENCOUNTER — Other Ambulatory Visit: Payer: Self-pay | Admitting: Internal Medicine

## 2023-11-30 DIAGNOSIS — I2510 Atherosclerotic heart disease of native coronary artery without angina pectoris: Secondary | ICD-10-CM | POA: Diagnosis not present

## 2023-11-30 DIAGNOSIS — Z853 Personal history of malignant neoplasm of breast: Secondary | ICD-10-CM | POA: Diagnosis not present

## 2023-11-30 DIAGNOSIS — M810 Age-related osteoporosis without current pathological fracture: Secondary | ICD-10-CM | POA: Diagnosis not present

## 2023-11-30 DIAGNOSIS — J449 Chronic obstructive pulmonary disease, unspecified: Secondary | ICD-10-CM | POA: Diagnosis not present

## 2023-12-08 DIAGNOSIS — F411 Generalized anxiety disorder: Secondary | ICD-10-CM | POA: Diagnosis not present

## 2023-12-08 DIAGNOSIS — R419 Unspecified symptoms and signs involving cognitive functions and awareness: Secondary | ICD-10-CM | POA: Diagnosis not present

## 2023-12-08 DIAGNOSIS — R41840 Attention and concentration deficit: Secondary | ICD-10-CM | POA: Diagnosis not present

## 2023-12-24 ENCOUNTER — Other Ambulatory Visit: Payer: Self-pay | Admitting: Internal Medicine

## 2023-12-30 ENCOUNTER — Other Ambulatory Visit: Payer: Self-pay | Admitting: Internal Medicine

## 2023-12-31 DIAGNOSIS — J449 Chronic obstructive pulmonary disease, unspecified: Secondary | ICD-10-CM | POA: Diagnosis not present

## 2023-12-31 DIAGNOSIS — M810 Age-related osteoporosis without current pathological fracture: Secondary | ICD-10-CM | POA: Diagnosis not present

## 2023-12-31 DIAGNOSIS — I2510 Atherosclerotic heart disease of native coronary artery without angina pectoris: Secondary | ICD-10-CM | POA: Diagnosis not present

## 2023-12-31 DIAGNOSIS — Z853 Personal history of malignant neoplasm of breast: Secondary | ICD-10-CM | POA: Diagnosis not present

## 2024-01-04 ENCOUNTER — Other Ambulatory Visit: Payer: Self-pay | Admitting: Internal Medicine

## 2024-01-04 NOTE — Telephone Encounter (Unsigned)
 Copied from CRM #[id number]. Topic: Clinical - Medication Refill >> Jan 04, 2024  4:59 PM Corin V wrote: Medication: Fluticasone  Furoate (ARNUITY ELLIPTA ) 200 MCG/ACT AEPB [[id number]] albuterol  (VENTOLIN  HFA) 108 (90 Base) MCG/ACT inhaler  Scheduled for first available with Dr. Geronimo, needs bridge does until November apat  Has the patient contacted their pharmacy? Yes (Agent: If no, request that the patient contact the pharmacy for the refill. If patient does not wish to contact the pharmacy document the reason why and proceed with request.) (Agent: If yes, when and what did the pharmacy advise?)  This is the patient's preferred pharmacy:  [facility] - [place], [place] - [address] [place] [address] Phone: [phone] Fax: [phone]  Is this the correct pharmacy for this prescription? Yes If no, delete pharmacy and type the correct one.   Has the prescription been filled recently? No  Is the patient out of the medication? No  Has the patient been seen for an appointment in the last year OR does the patient have an upcoming appointment? Yes  Can we respond through MyChart? No- call [phone]  Agent: Please be advised that Rx refills may take up to 3 business days. We ask that you follow-up with your pharmacy.

## 2024-01-05 MED ORDER — ALBUTEROL SULFATE HFA 108 (90 BASE) MCG/ACT IN AERS: 2.0000 | INHALATION_SPRAY | Freq: Four times a day (QID) | RESPIRATORY_TRACT | 3 refills | Status: AC | PRN

## 2024-01-05 MED ORDER — ARNUITY ELLIPTA 200 MCG/ACT IN AEPB: 1.0000 | INHALATION_SPRAY | Freq: Every day | RESPIRATORY_TRACT | 3 refills | Status: DC

## 2024-01-05 NOTE — Telephone Encounter (Signed)
 Please advise if okay to give refills,she has upcoming appointment in November with you

## 2024-01-12 ENCOUNTER — Telehealth: Payer: Self-pay

## 2024-01-12 MED ORDER — ARNUITY ELLIPTA 200 MCG/ACT IN AEPB: 1.0000 | INHALATION_SPRAY | Freq: Every day | RESPIRATORY_TRACT | 3 refills | Status: AC

## 2024-01-12 NOTE — Telephone Encounter (Signed)
 Copied from CRM [id number]. Topic: Clinical - Prescription Issue >> Jan 11, 2024  4:18 PM Celestine FALCON wrote: Reason for CRM: Pt requested a refill for Fluticasone  Furoate (ARNUITY ELLIPTA ) 200 MCG/ACT AEPB on 01/04/2024 and the last note on the med refill was from [facility] on 01/05/2024 asking if it was okay to give refills as the pt has an upcoming appt in Nov with Dr. Geronimo, but the provider did not reply.  Pt is calling back and requesting to know if the med would or would not be filled.   Please call the pt with info at [phone] ok to leave a vm.   I called and spoke to pt. Pt states she has asked for refills for Arnuity and she only has Albuterol . I informed pt that I could send her refills to last her until her appointment, but she must keep her upcoming appointment as we have not seen her in 2 years, and she must keep the appt for further refills. Pt verbalized understanding. Pt confirmed her pharmacy of [facility]. Courtesy refills sent. NFN

## 2024-01-22 DIAGNOSIS — F331 Major depressive disorder, recurrent, moderate: Secondary | ICD-10-CM | POA: Diagnosis not present

## 2024-01-22 DIAGNOSIS — F411 Generalized anxiety disorder: Secondary | ICD-10-CM | POA: Diagnosis not present

## 2024-01-22 DIAGNOSIS — F900 Attention-deficit hyperactivity disorder, predominantly inattentive type: Secondary | ICD-10-CM | POA: Diagnosis not present

## 2024-01-22 DIAGNOSIS — F09 Unspecified mental disorder due to known physiological condition: Secondary | ICD-10-CM | POA: Diagnosis not present

## 2024-01-31 DIAGNOSIS — J449 Chronic obstructive pulmonary disease, unspecified: Secondary | ICD-10-CM | POA: Diagnosis not present

## 2024-01-31 DIAGNOSIS — M810 Age-related osteoporosis without current pathological fracture: Secondary | ICD-10-CM | POA: Diagnosis not present

## 2024-01-31 DIAGNOSIS — Z853 Personal history of malignant neoplasm of breast: Secondary | ICD-10-CM | POA: Diagnosis not present

## 2024-01-31 DIAGNOSIS — I2510 Atherosclerotic heart disease of native coronary artery without angina pectoris: Secondary | ICD-10-CM | POA: Diagnosis not present

## 2024-02-06 ENCOUNTER — Ambulatory Visit: Payer: Self-pay | Admitting: Internal Medicine

## 2024-02-06 NOTE — Progress Notes (Signed)
 Mild stiff heart muscle. Sorry for delay in releasing results

## 2024-02-10 DIAGNOSIS — M810 Age-related osteoporosis without current pathological fracture: Secondary | ICD-10-CM | POA: Diagnosis not present

## 2024-02-10 DIAGNOSIS — F322 Major depressive disorder, single episode, severe without psychotic features: Secondary | ICD-10-CM | POA: Diagnosis not present

## 2024-02-10 DIAGNOSIS — Z853 Personal history of malignant neoplasm of breast: Secondary | ICD-10-CM | POA: Diagnosis not present

## 2024-02-10 DIAGNOSIS — J449 Chronic obstructive pulmonary disease, unspecified: Secondary | ICD-10-CM | POA: Diagnosis not present

## 2024-02-10 DIAGNOSIS — I2510 Atherosclerotic heart disease of native coronary artery without angina pectoris: Secondary | ICD-10-CM | POA: Diagnosis not present

## 2024-02-10 DIAGNOSIS — R41840 Attention and concentration deficit: Secondary | ICD-10-CM | POA: Diagnosis not present

## 2024-02-10 DIAGNOSIS — E559 Vitamin D deficiency, unspecified: Secondary | ICD-10-CM | POA: Diagnosis not present

## 2024-02-10 DIAGNOSIS — Z79899 Other long term (current) drug therapy: Secondary | ICD-10-CM | POA: Diagnosis not present

## 2024-02-10 DIAGNOSIS — Z1331 Encounter for screening for depression: Secondary | ICD-10-CM | POA: Diagnosis not present

## 2024-02-10 DIAGNOSIS — Z0000 Encounter for general adult medical examination without abnormal findings: Secondary | ICD-10-CM | POA: Diagnosis not present

## 2024-02-11 ENCOUNTER — Other Ambulatory Visit (HOSPITAL_BASED_OUTPATIENT_CLINIC_OR_DEPARTMENT_OTHER): Payer: Self-pay | Admitting: Internal Medicine

## 2024-02-11 DIAGNOSIS — M810 Age-related osteoporosis without current pathological fracture: Secondary | ICD-10-CM

## 2024-02-22 ENCOUNTER — Other Ambulatory Visit: Payer: Self-pay | Admitting: Internal Medicine

## 2024-02-22 DIAGNOSIS — Z1231 Encounter for screening mammogram for malignant neoplasm of breast: Secondary | ICD-10-CM

## 2024-03-01 DIAGNOSIS — Z853 Personal history of malignant neoplasm of breast: Secondary | ICD-10-CM | POA: Diagnosis not present

## 2024-03-01 DIAGNOSIS — M810 Age-related osteoporosis without current pathological fracture: Secondary | ICD-10-CM | POA: Diagnosis not present

## 2024-03-01 DIAGNOSIS — J449 Chronic obstructive pulmonary disease, unspecified: Secondary | ICD-10-CM | POA: Diagnosis not present

## 2024-03-01 DIAGNOSIS — I2510 Atherosclerotic heart disease of native coronary artery without angina pectoris: Secondary | ICD-10-CM | POA: Diagnosis not present

## 2024-03-02 ENCOUNTER — Ambulatory Visit
Admission: RE | Admit: 2024-03-02 | Discharge: 2024-03-02 | Disposition: A | Source: Ambulatory Visit | Attending: Internal Medicine | Admitting: Internal Medicine

## 2024-03-02 DIAGNOSIS — Z1231 Encounter for screening mammogram for malignant neoplasm of breast: Secondary | ICD-10-CM

## 2024-03-08 DIAGNOSIS — F411 Generalized anxiety disorder: Secondary | ICD-10-CM | POA: Diagnosis not present

## 2024-03-08 DIAGNOSIS — F331 Major depressive disorder, recurrent, moderate: Secondary | ICD-10-CM | POA: Diagnosis not present

## 2024-03-08 DIAGNOSIS — F09 Unspecified mental disorder due to known physiological condition: Secondary | ICD-10-CM | POA: Diagnosis not present

## 2024-03-08 DIAGNOSIS — F900 Attention-deficit hyperactivity disorder, predominantly inattentive type: Secondary | ICD-10-CM | POA: Diagnosis not present

## 2024-04-01 DIAGNOSIS — Z853 Personal history of malignant neoplasm of breast: Secondary | ICD-10-CM | POA: Diagnosis not present

## 2024-04-01 DIAGNOSIS — I2510 Atherosclerotic heart disease of native coronary artery without angina pectoris: Secondary | ICD-10-CM | POA: Diagnosis not present

## 2024-04-01 DIAGNOSIS — J449 Chronic obstructive pulmonary disease, unspecified: Secondary | ICD-10-CM | POA: Diagnosis not present

## 2024-04-01 DIAGNOSIS — M810 Age-related osteoporosis without current pathological fracture: Secondary | ICD-10-CM | POA: Diagnosis not present

## 2024-04-06 ENCOUNTER — Ambulatory Visit: Admitting: Internal Medicine

## 2024-04-06 ENCOUNTER — Encounter: Payer: Self-pay | Admitting: Internal Medicine

## 2024-04-06 VITALS — BP 112/68 | HR 74 | Ht 64.0 in | Wt 125.8 lb

## 2024-04-06 DIAGNOSIS — J4530 Mild persistent asthma, uncomplicated: Secondary | ICD-10-CM

## 2024-04-06 MED ORDER — AIRSUPRA 90-80 MCG/ACT IN AERO: 2.0000 | INHALATION_SPRAY | Freq: Four times a day (QID) | RESPIRATORY_TRACT | 2 refills | Status: AC | PRN

## 2024-04-06 NOTE — Patient Instructions (Addendum)
 Asthma will controlled - mild persistent  Well controllled as of 04/06/2024 on arunity PFT normal 07/26/2021 Glad you had flu shot Residual shortness of breath is due to diastolic dysfunction [2023]  Plan -  - continue ARNUITY as scheduled daily  - will do a fill for airsupra and if affordable you can try this as needed  - use albuterol  as needed\  =-CMA to ensure refills - -Continue Flonase  - Monitor baseline shortness of breath.   Lung nodule < 6mm on CT in 2020 - no change April 2023 Prior smoker and passive smoking hisotry History of radon exposure Family history of lung cancer - cousin  -Stable less than 3 mm nodule on CT scan April 2023, compared to 2020 -   Followup  - No further CT scan of the chest given the fact you age over 15  Follow-up - 9 months or sooner if needed

## 2024-04-06 NOTE — Progress Notes (Signed)
 IOV 09/04/2017  Chief Complaint  Patient presents with   Consult    Referred by Dr. Delice for cough.  Pt states she had an episode beginning 12/1 which lasted for two weeks and then in mid January 2019 had another episode with cough. Denies any current complaints of cough, or CP but has some mild SOB and has some mucus in chest/throat.  Pt is currently taking prednisone .   78 year old patient referred by Dr. Delice for evaluation of cough.  History is gained from talking to her and review of referral records.  Patient tells me that at baseline she never had any cough or shortness of breath but on deeper questioning she did admit to very mild dyspnea on exertion going on for some months or years.  But it was barely noticeable.  Then in early December 2000 and she did have an episode of bronchitis that resulted in cough lasting a few weeks.  This resolved.  Then in January 2019 she had another episode of bronchitis and the cough is still lingered as of this date.  She initially required a round of antibiotic and later prednisone  which she says she did not take correctly.  Then in February another course of prednisone .  Currently on third course of prednisone  for a few days and has several more days to go.  With this third course of prednisone  she is beginning to feel better.  Cough was initially severe and associated with inability to lie flat and wheezing.  But with his third round of prednisone  it is now resolved to mild and she is able to sleep in her bed shortness of breath is around the same there is no sputum although review of the records indicate that she did have some green sputum in the past.  She is not on any maintenance inhalers but has taken albuterol  in the past with relief.  Exposure history: She lives in an 80 year old home and is lived in the same home for 30 years.  She has noted some mold in the windows.  In addition she uses a wood stove in the basement during the winter season  which is been doing for many years.  Also has a history of radon exposure in the same home when she got it tested over 10 years ago.  She is also been a smoker 80 pack smoking history having quit within the last 15 years or less.     OV 10/16/2017  Chief Complaint  Patient presents with   Follow-up    PFT done 4/12 and HRCT done 80/7.  Pt states when she was on prednisone , the cough and wheezing stopped. Around Easter, pt's cough came back bad. Pt states it is hard for her to cough mucus up but has it in her chest.    Follow-up chronic cough in the setting of radon exposure, prior 80 pack smoking history and more recent mold exposure.  At last visit when I saw her for the first time in April 2019 exam nitric oxide  was slightly in the gray zone and in the setting of prednisone  her cough is getting better.  She returns now after finishing the prednisone  and completing work-up with high-resolution CT chest and pulmonary function test.  She had her pulmonary function test April 12, 80 prior to Easter and this is normal and I personally visualized the image.  She did have high-resolution CT chest in May 2019 and there is no interstitial lung disease oror  cancer.  She has tiny 4 mm lung nodule and some really mild emphysema.  However she tells me that after she finished her prednisone  around Easter 2019 her cough returned with a vengeance and she has had significant wheezing.  In fact her cough symptom score documented below shows severe amount of symptoms.  She has nocturnal cough.  So we retested exam nitric oxide  today and is significantly elevated at 59 ppb   IMPRESSION: - personally visualized image and agree with findings 1. No definitive evidence to suggest interstitial lung disease. There are some areas of parenchymal banding in the lung bases bilaterally which are strongly favored to reflect areas of chronic post infectious or inflammatory scarring. 2. Mild diffuse bronchial thickening with  mild centrilobular and paraseptal emphysema; imaging findings suggestive of underlying COPD. 3. Multiple tiny 2-4 mm pulmonary nodules scattered throughout both lungs, nonspecific, but statistically likely benign. No follow-up needed if patient is low-risk (and has no known or suspected primary neoplasm). Non-contrast chest CT can be considered in 12 months if patient is high-risk. This recommendation follows the consensus statement: Guidelines for Management of Incidental Pulmonary Nodules Detected on CT Images: From the Fleischner Society 2017; Radiology 2017; 284:228-243. 4. Aortic atherosclerosis, in addition to 2 vessel coronary artery disease. Assessment for potential risk factor modification, dietary therapy or pharmacologic therapy may be warranted, if clinically indicated.   Aortic Atherosclerosis (ICD10-I70.0) and Emphysema (ICD10-J43.9).     Electronically Signed   By: Toribio Aye M.D.   On: 10/06/2017 16:30      OV 11/18/2017  Chief Complaint  Patient presents with   Follow-up    6/15 Sinus started to drain yellow in color. Having chest congestion, wheezing coughing productive yellow mucus. She had some left over doxycline and has taken 50mg  qd for 8 days now.   Dominique Cobb , 78 y.o. , with dob 06/02/1946 and female ,Not Hispanic or Latino from [address] [place] [address] - presents to lung clinic for chronic cough - cough variant asthma/midl radiologic emphsyema + lung nodule 4mm with prior 80ppd smokhng hx  -Last visit I started her on Symbicort  and given prednisone . After this the cough resolved essentially and the wheezing completely resolved. This happened to the point that she actually stopped taking her Symbicort  and then she she developed acute sinus infection with yellow sinus drainage and change in voiceand ear blockage. She had some leftover doxycycline  50 mg tablets given earlier sometime back by her primary care physician assistant. She  started taking this at 50 mg once a day and is only some better. She now feels the cough is descended into her lungs and is having some wheezing. Exhaled nitric oxide  today is normal but she feels she'll benefit from prednisone . RSI cough score deteriorated and is documented below.    OV 01/22/2018  Chief Complaint  Patient presents with   Follow-up    Pt states she has been doing good since last visit and denies any complaints or concerns.   For cough. Asthma with nodule and emphysema  She is doing really well. Exhaled nitric oxide  is 10 ppb. RSI cough score is minimally symptomatic. The main issues that she cut her right hand with glass and she's lost significant function. She has 28 stitches. She is going to start occupational therapy. SHe is open to reducing her be escalating Symbicort  2 inhaled steroid alone.   Feno 09/04/2017 - 29 ppb (on prednsione) ->  Repeat FeNO 10/16/2017 ->  59  OV 06/10/2018  Subjective:  Patient ID: Dominique Cobb, female , DOB: [birth year] , age 12 y.o. , MRN: [id number] , ADDRESS: [address] [place] [address]   06/10/2018 -   Chief Complaint  Patient presents with   Follow-up    Pt states she had been doing good until the first of January 2020. States she has a cough with yellow mucus and clear sinus drainage. Pt states she does have some mild chest tightness.     HPI Dominique Cobb 78 y.o. -here on an acute visit.  She has asthma with emphysema and a lung nodule  She tells me that the house she lives is on a gravel road and has timber dust and she is constantly being exposed to it but in reality for the holiday she went to [place] and after the new year 2020 she developed respiratory infection with sinus congestion that is distended down to her chest and is causing coughing, wheezing and no sputum.  She is only somewhat better but she still has a wheeze.  She continues to be on Pulmicort .  She says that compared to previous times she is still able  to sleep because of pulmicort    07/20/2020  78 year old female, former smoker quit in 2006 (80 pack year hx). PMH significant for asthma, pulmonary nodules, emphysema. Patient of Dr. Geronimo, last seen in office on 06/10/18. Maintained on Pulmicort  , albuterol  hfa and flonase  nasal spray.    Patient presents today for overdue follow-up for asthma. She is needing inhaler medication refills. She is complaint with pulmicort  inhaler. If she misses Pulmicort  dose she does notice a little more wheezing in the morning. She has not require Ventolin  rescue inhaler. She had a wood stove at home but this has been removed. CT chest in June 2020 showed biapical pleural-parenchymal scarring, mild centrilobular and paraseptal emphysema and small subpleural nodules bilaterally measuring 2-39mm and considered benign.    Pulmonary testing: PFTs- FVC 2.78 (106%), FEV1 2.14 (109%), ratio 77, DLCOunc 19.10 (88%), no BD response  Feno 09/04/2017 - 29 ppb (on prednsione) ->  Repeat FeNO 10/16/2017 ->  59   Imaging: CT chest June 2020- small subpleural nodules bilaterally measuring 2-65mm considered benign, no dedicated  follow-up necessary.      OV 07/26/2021  Subjective:  Patient ID: Dominique Cobb, female , DOB: 1946/04/13 , age 12 y.o. , MRN: [id number] , ADDRESS: [address] [place] [address] PCP Delice Charleston, MD Patient Care Team: Delice Charleston, MD as PCP - General (Internal Medicine) Celestia Joesph SQUIBB, RN as Oncology Nurse Navigator (Oncology)  This Provider for this visit: Treatment Team:  Attending Provider: Geronimo Amel, MD    07/26/2021 -   Chief Complaint  Patient presents with   Follow-up    PFT performed today.  Pt states she has been doing okay since last visit and denies any complaints.     HPI Dominique Cobb 78 y.o. -last seen by myself in the beginning of the pandemic approximately 3 years ago.  Seen by nurse practitioner 1 year ago.  In the interim in the summer 2022  she had outpatient COVID.  She had GI symptoms.  She took antiviral she got better.  Husband also had COVID.  But she is really here for asthma follow-up.  She says she is doing well.  She no longer is on Pulmicort  because of insurance issues with coverage.  She is now on Flovent  which is costing her $40  per month co-pay.  She is waiting for Oneil Leach to come up with his pharmacy so she can get better pricing.  In the interim she has not had any acute exacerbations.  No emergency room visits.  No urgent care visits no albuterol  rescue usage no nocturnal awakenings.  ACT score shows really good control.  Of note she is 65.  She used to smoke in the past and she quit 17 years ago.  She today told me that she did have radon exposure in the house for 15 years but she fixed this 20 years ago.  In the last CT scan 2 years ago have some small micronodules.  We discussed about following this.  She is open to the idea.  She is worried about lung cancer risk.  This is particularly because one of her cousins is actively dying from lung cancer and was a non-smoker. Asthma Control Test ACT Total Score  07/26/2021 25  07/20/2020 21     Lab Results  Component Value Date   NITRICOXIDE 10 01/22/2018     OV 03/20/2022  Subjective:  Patient ID: Dominique Cobb, female , DOB: 06/06/1945 , age 73 y.o. , MRN: [id number] , ADDRESS: [address] [place] [address] PCP Delice Charleston, MD Patient Care Team: Delice Charleston, MD as PCP - General (Internal Medicine) Celestia Joesph SQUIBB, RN as Oncology Nurse Navigator (Oncology)  This Provider for this visit: Treatment Team:  Attending Provider: Geronimo Amel, MD  Asthma follow-up History of outpatient mild COVID summer 2022 History of Bochdalek hernia Multiple small lung nodules stable History of 80 pack smoking history Clinical emphysema on CT scan April 2023 but DLCO on PFT normal February 2023. History of radon exposure  03/20/2022 -   Chief Complaint   Patient presents with   Follow-up    Discuss CT scan from April 2023. Scheduled in Nov 2023 for cardiologist. No sx noted today.     HPI Dominique Cobb 78 y.o. -returns for follow-up.  Her asthma is well controlled.  She is on Flovent .  She denies any shortness of breath.  No cough no wheezing no paroxysmal nocturnal dyspnea and no orthopnea.  No chest pain.  She feels good but she says she is feeling that she is falling apart.  She has recent back pain she seen Dr. Luzier.  She is also concerned about coronary artery calcifications and arctic valve calcification.  Review of the record and talking to indicate that an echo has been set up and also follow-up with Dr. Annabella Scarce.  Her paternal grandfather and an uncle had coronary artery disease.  But she does not.  And her dad and mom did not have coronary artery disease.  Her mom had an accidental death.  Nevertheless she is going to see cardiology which I think is a good idea.  For her nodules she did have CT scan April 2023 and these nodules are stable   CT Chest data APril 2023  Narrative & Impression  CLINICAL DATA:  78 year old female with history of pulmonary nodules. Follow-up study.   EXAM: CT CHEST WITHOUT CONTRAST   TECHNIQUE: Multidetector CT imaging of the chest was performed following the standard protocol without IV contrast.   RADIATION DOSE REDUCTION: This exam was performed according to the departmental dose-optimization program which includes automated exposure control, adjustment of the mA and/or kV according to patient size and/or use of iterative reconstruction technique.   COMPARISON:  Chest CT 11/09/2018.   FINDINGS: Cardiovascular:  Heart size is normal. There is no significant pericardial fluid, thickening or pericardial calcification. There is aortic atherosclerosis, as well as atherosclerosis of the great vessels of the mediastinum and the coronary arteries, including calcified atherosclerotic  plaque in the left main, left anterior descending, left circumflex and right coronary arteries. Calcifications of the aortic valve.   Mediastinum/Nodes: No pathologically enlarged mediastinal or hilar lymph nodes. Please note that accurate exclusion of hilar adenopathy is limited on noncontrast CT scans. Esophagus is unremarkable in appearance. No axillary lymphadenopathy.   Lungs/Pleura: Multiple tiny 2-3 mm pulmonary nodules are again noted scattered throughout the lungs bilaterally, stable in number and size compared to the prior study. No other larger more suspicious appearing pulmonary nodules or masses are noted. No acute consolidative airspace disease. No pleural effusions. Small right-sided Bochdalek's hernia incidentally noted. Mild diffuse bronchial wall thickening with very mild centrilobular and paraseptal emphysema.   Upper Abdomen: Aortic atherosclerosis.   Musculoskeletal: There are no aggressive appearing lytic or blastic lesions noted in the visualized portions of the skeleton. Orthopedic fixation hardware in the lower cervical spine incompletely imaged.   IMPRESSION: 1. Small 2-3 mm pulmonary nodules scattered throughout both lungs, stable compared to the prior study considered definitively benign requiring no future imaging follow-up. 2. Aortic atherosclerosis, in addition to left main and three-vessel coronary artery disease. Assessment for potential risk factor modification, dietary therapy or pharmacologic therapy may be warranted, if clinically indicated. 3. Mild diffuse bronchial wall thickening with very mild centrilobular and paraseptal emphysema; imaging findings suggestive of underlying COPD. 4. There are calcifications of the aortic valve. Echocardiographic correlation for evaluation of potential valvular dysfunction may be warranted if clinically indicated.   Aortic Atherosclerosis (ICD10-I70.0) and Emphysema (ICD10-J43.9).     Electronically  Signed   By: Toribio Aye M.D.   On: 09/03/2021 08:22      OV 04/06/2024  Subjective:  Patient ID: Dominique Cobb, female , DOB: Aug 26, 1945 , age 57 y.o. , MRN: [id number] , ADDRESS: [address] [place] [address] PCP [name] Trula SQUIBB, MD Patient Care Team: [name] Trula SQUIBB, MD as PCP - General (Internal Medicine) Raford Riggs, MD as PCP - Cardiology (Cardiology) Celestia Joesph SQUIBB, RN as Oncology Nurse Navigator (Oncology)  This Provider for this visit: Treatment Team:  Attending Provider: Geronimo Amel, MD    04/06/2024 -   Chief Complaint  Patient presents with   Medical Management of Chronic Issues   Asthma    Breathing is about the same. She has occ cough and wheezing. Cough is occ prod with clear sputum.     Asthma follow-up History of outpatient mild COVID summer 2022 History of Bochdalek hernia Multiple small lung nodules stable History of 80 pack smoking history Clinical emphysema on CT scan April 2023 but DLCO on PFT normal February 2023. History of radon exposure   HPI Dominique Cobb 78 y.o. -Dominique Cobb is an 78 year old female with asthma who presents for follow-up and management of her condition.  She has a history of asthma and is currently taking Arnuity every morning. She reports that she has not had any emergency room visits, hospitalizations, or surgeries this year. She experiences shortness of breath with exertion, such as going up and down stairs, walking uphill, walking fast, or carrying heavy weights.  She was recently diagnosed with ADD about a month ago and has been prescribed a generic form of Adderall, which has significantly improved her focus. She mentions having used her ex-wife's  supply prior to her formal diagnosis and found it beneficial.  Her past medical history includes coronary calcification, which she attributes to smoking. She has never had high cholesterol but was placed on cholesterol medication. She has undergone  a stress test and has seen a cardiologist regarding her condition.  Review of the external medical records indicate in 2023 she had a low risk cardiac stress test echo showed diastolic dysfunction.  She has received her flu and pneumonia vaccinations.    Asthma Control Panel 06/10/2018   Current Med Regimen   ACQ 5 point- 1 week. wtd avg score. <1.0 is good control 0.75-1.25 is grey zone. >1.25 poor control. Delta 0.5 is clinically meaningful 1.4  FeNO ppB 8  FeV1    Planned intervention  for visit    PFT     Latest Ref Rng & Units 07/26/2021    1:55 PM 09/11/2017    2:45 PM  PFT Results  FVC-Pre L 2.21  2.81   FVC-Predicted Pre % 88  107   FVC-Post L 2.34  2.78   FVC-Predicted Post % 93  106   Pre FEV1/FVC % % 75  74   Post FEV1/FCV % % 75  77   FEV1-Pre L 1.65  2.09   FEV1-Predicted Pre % 89  107   FEV1-Post L 1.76  2.14   DLCO uncorrected ml/min/mmHg 17.60  19.10   DLCO UNC% % 99  88   DLCO corrected ml/min/mmHg 17.60    DLCO COR %Predicted % 99    DLVA Predicted % 105  90   TLC L 4.85  5.09   TLC % Predicted % 101  107   RV % Predicted % 111  85        LAB RESULTS last 96 hours No results found.       has a past medical history of Anxiety, Aortic atherosclerosis, Arthritis, Asthma, CAD in native artery (04/28/2022), Cancer (HCC), Depression, Dysphagia, Dyspnea, Family history of adverse reaction to anesthesia, GERD (gastroesophageal reflux disease), and Pure hypercholesterolemia (04/28/2022).   reports that she quit smoking about 19 years ago. Her smoking use included cigarettes. She started smoking about 59 years ago. She has a 80 pack-year smoking history. She has never used smokeless tobacco.  Past Surgical History:  Procedure Laterality Date   BREAST CYST ASPIRATION Left    BREAST EXCISIONAL BIOPSY Left    BREAST LUMPECTOMY     BREAST LUMPECTOMY WITH RADIOACTIVE SEED AND SENTINEL LYMPH NODE BIOPSY Left 03/08/2020   Procedure: LEFT BREAST LUMPECTOMY WITH  RADIOACTIVE SEED AND LEFT AXILLARY SENTINEL LYMPH NODE BIOPSY;  Surgeon: Ebbie Cough, MD;  Location: [facility] OR;  Service: General;  Laterality: Left;  PEC BLOCK   BREAST SURGERY     benign.  25 yrs ago   CARPAL TUNNEL RELEASE  1980's   bilateral    CATARACT EXTRACTION W/PHACO Left 06/12/2016   Procedure: CATARACT EXTRACTION PHACO AND INTRAOCULAR LENS PLACEMENT LEFT EYE RIZ1.57;  Surgeon: Cherene Mania, MD;  Location: [facility];  Service: Ophthalmology;  Laterality: Left;  left   CATARACT EXTRACTION W/PHACO Right 07/24/2016   Procedure: CATARACT EXTRACTION PHACO AND INTRAOCULAR LENS PLACEMENT (IOC);  Surgeon: Cherene Mania, MD;  Location: [facility];  Service: Ophthalmology;  Laterality: Right;  right cde 8.29   COLONOSCOPY N/A 10/29/2012   Procedure: COLONOSCOPY;  Surgeon: Claudis RAYMOND Rivet, MD;  Location: [facility];  Service: Endoscopy;  Laterality: N/A;  140   Dental implants     FINGER  SURGERY     trigger finger 3rd  bilaterally   laproscopy     over 20 yrs ago   NECK SURGERY     2007   TONSILLECTOMY      Allergies  Allergen Reactions   Codeine Other (See Comments)    Sweating and Patient just doesn't like.     Immunization History  Administered Date(s) Administered   Fluzone Influenza virus vaccine,trivalent (IIV3), split virus 02/07/2020   INFLUENZA, HIGH DOSE SEASONAL PF 03/02/2017, 03/04/2018, 02/01/2019, 02/19/2021, 03/02/2024   Influenza-Unspecified 02/03/2019, 02/19/2022   Moderna Sars-Covid-2 Vaccination 04/04/2020, 10/16/2020   PFIZER(Purple Top)SARS-COV-2 Vaccination 07/20/2019, 08/10/2019   Pneumococcal Conjugate,unspecified 02/19/2022   Zoster Recombinant(Shingrix) 05/17/2018    Family History  Problem Relation Age of Onset   Alcoholism Mother    Depression Mother    Arthritis Father    Thyroid disease Father    Heart attack Maternal Uncle    Heart attack Paternal Uncle    Hypertension Maternal Grandmother    Dementia Maternal Grandmother    Arthritis Maternal  Grandfather    Hip fracture Paternal Grandmother    Heart disease Paternal Grandfather    Heart attack Paternal Grandfather    Heart attack Maternal Uncle    Breast cancer Neg Hx      Current Outpatient Medications:    albuterol  (VENTOLIN  HFA) 108 (90 Base) MCG/ACT inhaler, Inhale 2 puffs into the lungs every 6 (six) hours as needed for wheezing or shortness of breath., Disp: 8 g, Rfl: 3   Albuterol -Budesonide  (AIRSUPRA) 90-80 MCG/ACT AERO, Inhale 2 puffs into the lungs 4 (four) times daily as needed., Disp: 1 g, Rfl: 2   amphetamine-dextroamphetamine (ADDERALL) 10 MG tablet, Take 10 mg by mouth 2 (two) times daily with a meal., Disp: , Rfl:    Cholecalciferol (VITAMIN D3) 50 MCG (2000 UT) capsule, Take 2,000 Units by mouth daily., Disp: , Rfl:    ezetimibe  (ZETIA ) 10 MG tablet, Take 1 tablet (10 mg total) by mouth daily., Disp: 90 tablet, Rfl: 3   fluticasone  (FLONASE ) 50 MCG/ACT nasal spray, Place 1 spray into both nostrils 2 (two) times daily., Disp: 16 g, Rfl: 2   Fluticasone  Furoate (ARNUITY ELLIPTA ) 200 MCG/ACT AEPB, Inhale 1 puff into the lungs daily., Disp: 30 each, Rfl: 3   rosuvastatin  (CRESTOR ) 10 MG tablet, Take 1 tablet (10 mg total) by mouth daily., Disp: 90 tablet, Rfl: 3   sertraline (ZOLOFT) 100 MG tablet, Take 1.5 tablets by mouth daily., Disp: , Rfl:    zolpidem  (AMBIEN ) 5 MG tablet, Take 5 mg by mouth at bedtime as needed for sleep. , Disp: , Rfl:    aspirin EC 81 MG tablet, Take 81 mg by mouth daily. Swallow whole. (Patient not taking: Reported on 04/06/2024), Disp: , Rfl:    benzonatate  (TESSALON ) 200 MG capsule, Take 1 capsule (200 mg total) by mouth 3 (three) times daily as needed for cough. (Patient not taking: Reported on 09/10/2023), Disp: 20 capsule, Rfl: 0      Objective:   Vitals:   04/06/24 1259  BP: 112/68  Pulse: 74  SpO2: 97%  Weight: 125 lb 12.8 oz (57.1 kg)  Height: 5' 4 (1.626 m)    Estimated body mass index is 21.59 kg/m as calculated from the  following:   Height as of this encounter: 5' 4 (1.626 m).   Weight as of this encounter: 125 lb 12.8 oz (57.1 kg).  @WEIGHTCHANGE @  Filed Weights   04/06/24 1259  Weight: 125 lb  12.8 oz (57.1 kg)     Physical Exam   General: No distress. Looks well O2 at rest: no Cane present: no Sitting in wheel chair: no Frail: no Obese: no Neuro: Alert and Oriented x 3. GCS 15. Speech normal Psych: Pleasant Resp:  Barrel Chest - no.  Wheeze - no, Crackles - no, No overt respiratory distress CVS: Normal heart sounds. Murmurs - no Ext: Stigmata of Connective Tissue Disease - non HEENT: Normal upper airway. PEERL +. No post nasal drip        Assessment/     Assessment & Plan Mild persistent asthma in adult without complication    PLAN Patient Instructions  Asthma will controlled - mild persistent  Well controllled as of 04/06/2024 on arunity PFT normal 07/26/2021 Glad you had flu shot Residual shortness of breath is due to diastolic dysfunction [2023]  Plan -  - continue ARNUITY as scheduled daily  - will do a fill for airsupra and if affordable you can try this as needed  - use albuterol  as needed\  =-CMA to ensure refills - -Continue Flonase  - Monitor baseline shortness of breath.   Lung nodule < 6mm on CT in 2020 - no change April 2023 Prior smoker and passive smoking hisotry History of radon exposure Family history of lung cancer - cousin  -Stable less than 3 mm nodule on CT scan April 2023, compared to 2020 -   Followup  - No further CT scan of the chest given the fact you age over 65  Follow-up - 9 months or sooner if needed    FOLLOWUP    Return for - 9 months or sooner if needed.    SIGNATURE    Dr. Dorethia Cave, M.D., F.C.C.P,  Pulmonary and Critical Care Medicine Staff Physician, [facility] Director - Interstitial Lung Disease  Program  Pulmonary Fibrosis [facility] at [facility] [place],  [place], [address]  Pager: [phone], If no answer or between  15:00h - 7:00h: call [phone] Telephone: [phone]  1:28 PM 04/06/2024

## 2024-05-01 DIAGNOSIS — J449 Chronic obstructive pulmonary disease, unspecified: Secondary | ICD-10-CM | POA: Diagnosis not present

## 2024-05-01 DIAGNOSIS — Z853 Personal history of malignant neoplasm of breast: Secondary | ICD-10-CM | POA: Diagnosis not present

## 2024-05-01 DIAGNOSIS — M810 Age-related osteoporosis without current pathological fracture: Secondary | ICD-10-CM | POA: Diagnosis not present

## 2024-05-01 DIAGNOSIS — I2510 Atherosclerotic heart disease of native coronary artery without angina pectoris: Secondary | ICD-10-CM | POA: Diagnosis not present
